# Patient Record
Sex: Female | Born: 1935 | Race: White | Hispanic: No | Smoking: Never smoker
Health system: Southern US, Community
[De-identification: ages and names within clinical notes are randomized; demographics above are authoritative.]

## PROBLEM LIST (undated history)

## (undated) DIAGNOSIS — B159 Hepatitis A without hepatic coma: Secondary | ICD-10-CM

## (undated) DIAGNOSIS — I10 Essential (primary) hypertension: Secondary | ICD-10-CM

## (undated) DIAGNOSIS — J189 Pneumonia, unspecified organism: Secondary | ICD-10-CM

## (undated) DIAGNOSIS — C50912 Malignant neoplasm of unspecified site of left female breast: Secondary | ICD-10-CM

## (undated) DIAGNOSIS — F419 Anxiety disorder, unspecified: Secondary | ICD-10-CM

## (undated) DIAGNOSIS — Z9889 Other specified postprocedural states: Secondary | ICD-10-CM

## (undated) DIAGNOSIS — M1990 Unspecified osteoarthritis, unspecified site: Secondary | ICD-10-CM

## (undated) DIAGNOSIS — Z9289 Personal history of other medical treatment: Secondary | ICD-10-CM

## (undated) DIAGNOSIS — H53121 Transient visual loss, right eye: Secondary | ICD-10-CM

## (undated) DIAGNOSIS — F32A Depression, unspecified: Secondary | ICD-10-CM

## (undated) DIAGNOSIS — IMO0001 Reserved for inherently not codable concepts without codable children: Secondary | ICD-10-CM

## (undated) DIAGNOSIS — I4891 Unspecified atrial fibrillation: Secondary | ICD-10-CM

## (undated) DIAGNOSIS — R112 Nausea with vomiting, unspecified: Secondary | ICD-10-CM

## (undated) DIAGNOSIS — Z923 Personal history of irradiation: Secondary | ICD-10-CM

## (undated) DIAGNOSIS — F329 Major depressive disorder, single episode, unspecified: Secondary | ICD-10-CM

## (undated) DIAGNOSIS — H5319 Other subjective visual disturbances: Secondary | ICD-10-CM

## (undated) DIAGNOSIS — D649 Anemia, unspecified: Secondary | ICD-10-CM

## (undated) DIAGNOSIS — E042 Nontoxic multinodular goiter: Secondary | ICD-10-CM

## (undated) DIAGNOSIS — C50919 Malignant neoplasm of unspecified site of unspecified female breast: Secondary | ICD-10-CM

## (undated) HISTORY — PX: CATARACT EXTRACTION W/ INTRAOCULAR LENS IMPLANT: SHX1309

## (undated) HISTORY — PX: TONSILLECTOMY AND ADENOIDECTOMY: SUR1326

## (undated) HISTORY — DX: Nontoxic multinodular goiter: E04.2

## (undated) HISTORY — PX: FRACTURE SURGERY: SHX138

## (undated) HISTORY — DX: Unspecified atrial fibrillation: I48.91

## (undated) HISTORY — DX: Essential (primary) hypertension: I10

## (undated) HISTORY — DX: Malignant neoplasm of unspecified site of unspecified female breast: C50.919

---
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
## 2008-11-19 HISTORY — PX: WRIST FRACTURE SURGERY: SHX121

## 2008-11-23 ENCOUNTER — Emergency Department (HOSPITAL_COMMUNITY): Admission: EM | Admit: 2008-11-23 | Discharge: 2008-11-24 | Payer: Self-pay | Admitting: Emergency Medicine

## 2008-12-24 ENCOUNTER — Ambulatory Visit: Payer: Self-pay | Admitting: Internal Medicine

## 2008-12-24 DIAGNOSIS — H902 Conductive hearing loss, unspecified: Secondary | ICD-10-CM | POA: Insufficient documentation

## 2008-12-28 ENCOUNTER — Telehealth (INDEPENDENT_AMBULATORY_CARE_PROVIDER_SITE_OTHER): Payer: Self-pay | Admitting: *Deleted

## 2009-03-18 DIAGNOSIS — K219 Gastro-esophageal reflux disease without esophagitis: Secondary | ICD-10-CM

## 2011-02-06 ENCOUNTER — Other Ambulatory Visit: Payer: Self-pay | Admitting: Geriatric Medicine

## 2011-02-06 DIAGNOSIS — Z1231 Encounter for screening mammogram for malignant neoplasm of breast: Secondary | ICD-10-CM

## 2011-02-23 ENCOUNTER — Ambulatory Visit
Admission: RE | Admit: 2011-02-23 | Discharge: 2011-02-23 | Disposition: A | Discharge status: Home / Self Care | Payer: Medicare Other | Source: Ambulatory Visit | Attending: Geriatric Medicine | Admitting: Geriatric Medicine

## 2011-02-23 DIAGNOSIS — Z1231 Encounter for screening mammogram for malignant neoplasm of breast: Secondary | ICD-10-CM

## 2011-04-10 ENCOUNTER — Other Ambulatory Visit: Payer: Self-pay | Admitting: Geriatric Medicine

## 2011-04-10 DIAGNOSIS — R928 Other abnormal and inconclusive findings on diagnostic imaging of breast: Secondary | ICD-10-CM

## 2011-04-26 ENCOUNTER — Ambulatory Visit: Payer: Medicare Other

## 2011-04-26 ENCOUNTER — Ambulatory Visit
Admission: RE | Admit: 2011-04-26 | Discharge: 2011-04-26 | Disposition: A | Discharge status: Home / Self Care | Payer: Medicare Other | Source: Ambulatory Visit | Attending: Geriatric Medicine | Admitting: Geriatric Medicine

## 2011-04-26 ENCOUNTER — Other Ambulatory Visit: Payer: Self-pay | Admitting: Geriatric Medicine

## 2011-04-26 DIAGNOSIS — R928 Other abnormal and inconclusive findings on diagnostic imaging of breast: Secondary | ICD-10-CM

## 2011-09-19 ENCOUNTER — Other Ambulatory Visit: Payer: Self-pay | Admitting: Geriatric Medicine

## 2011-09-19 DIAGNOSIS — N6009 Solitary cyst of unspecified breast: Secondary | ICD-10-CM

## 2011-11-29 ENCOUNTER — Ambulatory Visit
Admission: RE | Admit: 2011-11-29 | Discharge: 2011-11-29 | Disposition: A | Discharge status: Home / Self Care | Payer: Medicare Other | Source: Ambulatory Visit | Attending: Geriatric Medicine | Admitting: Geriatric Medicine

## 2011-11-29 DIAGNOSIS — N6009 Solitary cyst of unspecified breast: Secondary | ICD-10-CM

## 2012-01-25 ENCOUNTER — Other Ambulatory Visit: Payer: Self-pay | Admitting: Geriatric Medicine

## 2012-01-25 DIAGNOSIS — Z1231 Encounter for screening mammogram for malignant neoplasm of breast: Secondary | ICD-10-CM

## 2012-02-16 ENCOUNTER — Other Ambulatory Visit: Payer: Self-pay | Admitting: Geriatric Medicine

## 2012-02-16 DIAGNOSIS — H3400 Transient retinal artery occlusion, unspecified eye: Secondary | ICD-10-CM

## 2012-02-21 ENCOUNTER — Ambulatory Visit
Admission: RE | Admit: 2012-02-21 | Discharge: 2012-02-21 | Disposition: A | Discharge status: Home / Self Care | Payer: 59 | Source: Ambulatory Visit | Attending: Geriatric Medicine | Admitting: Geriatric Medicine

## 2012-02-21 DIAGNOSIS — H3400 Transient retinal artery occlusion, unspecified eye: Secondary | ICD-10-CM

## 2012-02-22 ENCOUNTER — Other Ambulatory Visit: Payer: Self-pay | Admitting: Geriatric Medicine

## 2012-02-22 DIAGNOSIS — E041 Nontoxic single thyroid nodule: Secondary | ICD-10-CM

## 2012-02-26 ENCOUNTER — Ambulatory Visit
Admission: RE | Admit: 2012-02-26 | Discharge: 2012-02-26 | Disposition: A | Discharge status: Home / Self Care | Payer: 59 | Source: Ambulatory Visit | Attending: Geriatric Medicine | Admitting: Geriatric Medicine

## 2012-02-26 DIAGNOSIS — E041 Nontoxic single thyroid nodule: Secondary | ICD-10-CM

## 2012-04-05 ENCOUNTER — Ambulatory Visit: Payer: Medicare Other

## 2012-06-06 ENCOUNTER — Ambulatory Visit: Payer: Self-pay

## 2012-07-11 ENCOUNTER — Ambulatory Visit: Payer: Self-pay

## 2013-01-14 ENCOUNTER — Encounter: Payer: Self-pay | Admitting: Cardiovascular Disease

## 2013-01-14 ENCOUNTER — Ambulatory Visit (INDEPENDENT_AMBULATORY_CARE_PROVIDER_SITE_OTHER): Payer: Medicare PPO | Admitting: Cardiovascular Disease

## 2013-01-14 VITALS — BP 139/78 | HR 61 | Ht 67.0 in | Wt 174.0 lb

## 2013-01-14 DIAGNOSIS — R000 Tachycardia, unspecified: Secondary | ICD-10-CM

## 2013-01-14 DIAGNOSIS — R0602 Shortness of breath: Secondary | ICD-10-CM

## 2013-01-14 DIAGNOSIS — R079 Chest pain, unspecified: Secondary | ICD-10-CM

## 2013-01-14 DIAGNOSIS — I4891 Unspecified atrial fibrillation: Secondary | ICD-10-CM

## 2013-01-14 DIAGNOSIS — I480 Paroxysmal atrial fibrillation: Secondary | ICD-10-CM

## 2013-01-14 MED ORDER — DILTIAZEM HCL ER COATED BEADS 120 MG PO CP24: 120.0000 mg | ORAL_CAPSULE | Freq: Every day | ORAL | Status: DC

## 2013-01-14 MED ORDER — RIVAROXABAN 20 MG PO TABS: 20.0000 mg | ORAL_TABLET | Freq: Every day | ORAL | Status: DC

## 2013-01-14 NOTE — Assessment & Plan Note (Signed)
Represents for further evaluation of paroxysmal atrial fibrillation. She clearly is in sinus rhythm today.  She has a history of borderline hypertension and has had hypertension in the past. She's 79 years old. She has a chadsVASC2 score of 3. In addition, she has been having some unsteadiness and some slight confusion. It is possible that she's had a small TIA or small strokes in the past.  I would favor starting her on full dose anticoagulation. We'll start her on Xarelto 20 mg a day.   We will discontinue his aspirin.  As a matter of convenience, we will change the regular Cardizem to long-acting Cardizem 120 mg a day.  We'll get an echocardiogram for further evaluation of her left exercise and function.  We'll see her back in the office in 2-3 months for followup visit.

## 2013-01-14 NOTE — Patient Instructions (Signed)
Stop taking Aspirin 325mg  daily.  Start taking Xarelto 20mg  once daily with largest meal of the day.  Stop taking Diltiazem 30mg  and start taking Diltiazem 120mg  once daily.  Schedule Echocardiogram.   Follow-up in 3 months.

## 2013-01-14 NOTE — Progress Notes (Signed)
     Terri Ponce Date of Birth  10-17-1928       [facility]    [place] [address]  [address] [place], [place]  [address]   [place], [place]  [address] [phone]     [phone]   Fax  [phone]    Fax [phone]  Problem List: 1. Paroxysmal atrial fibrillation 2. Thyroid nodules 3. Hx of Hypertension  History of Present Illness:  Terri Ponce is an 79 year old female who is seen today for further evaluation of paroxysmal atrial fibrillation.  She has had smptoms for years - palps off and on for years.   The palpitations are sporatic.  She thinks they may be related to stress.    She has a sensation in her left ribs.  She is able to push through the palpitations and denies any CP or dyspnea associated with the arrhythmia.  These palpitatioins   Recently she had some dyspnea at the beach and last week when she went to see Dr. Pete Glatter.    She seems to have some slight dizziness and unsteadiness recently.    She has had HTN in the past - resolved with a better diet and exercise and weight loss program.  BP now is ok.    She goes to the silver sneakers program  And measures her BP several times a week.  She has been having night sweats for the past several years.       No current outpatient prescriptions on file prior to visit.   No current facility-administered medications on file prior to visit.    Allergies  Allergen Reactions  . Amoxicillin     REACTION: abdominal pain  . Sulfonamide Derivatives     Past Medical History  Diagnosis Date  . A-fib   . Multiple thyroid nodules     Past Surgical History  Procedure Laterality Date  . Ovarian cyst surgery    . Intraocular lens insertion      right    History  Smoking status  . Never Smoker   Smokeless tobacco  . Not on file    History  Alcohol Use No    Family History  Problem Relation Age of Onset  . Arrhythmia Mother     A-Fib  . Hyperlipidemia  Mother   . Hypertension Mother     Reviw of Systems:  Reviewed in the HPI.  All other systems are negative.  Physical Exam: Blood pressure 139/78, pulse 61, height 5\' 7"  (1.702 m), weight 174 lb (78.926 kg). General: Well developed, well nourished, in no acute distress.  Head: Normocephalic, atraumatic, sclera non-icteric, mucus membranes are moist,   Neck: Supple. Carotids are 2 + without bruits. No JVD   Lungs: Clear   Heart: RR, normal S1, S2, no murmurs  Abdomen: Soft, non-tender, non-distended with normal bowel sounds.  Msk:  Strength and tone are normal   Extremities: No clubbing or cyanosis. No edema.  Distal pedal pulses are 2+ and equal    Neuro: CN II - XII intact.  Alert and oriented X 3.   Psych:  Normal   ECG: January 14, 2013:  NSR, occasional PVCs.    Assessment / Plan:

## 2013-01-16 ENCOUNTER — Ambulatory Visit (HOSPITAL_COMMUNITY): Payer: Medicare PPO | Attending: Cardiovascular Disease | Admitting: Radiology

## 2013-01-16 DIAGNOSIS — R000 Tachycardia, unspecified: Secondary | ICD-10-CM

## 2013-01-16 DIAGNOSIS — R079 Chest pain, unspecified: Secondary | ICD-10-CM

## 2013-01-16 DIAGNOSIS — I080 Rheumatic disorders of both mitral and aortic valves: Secondary | ICD-10-CM | POA: Insufficient documentation

## 2013-01-16 DIAGNOSIS — I4891 Unspecified atrial fibrillation: Secondary | ICD-10-CM

## 2013-01-16 DIAGNOSIS — R9431 Abnormal electrocardiogram [ECG] [EKG]: Secondary | ICD-10-CM

## 2013-01-16 DIAGNOSIS — I079 Rheumatic tricuspid valve disease, unspecified: Secondary | ICD-10-CM | POA: Insufficient documentation

## 2013-01-16 DIAGNOSIS — I10 Essential (primary) hypertension: Secondary | ICD-10-CM | POA: Insufficient documentation

## 2013-01-16 DIAGNOSIS — R0602 Shortness of breath: Secondary | ICD-10-CM

## 2013-01-16 NOTE — Progress Notes (Signed)
Echocardiogram performed.  

## 2013-01-24 ENCOUNTER — Institutional Professional Consult (permissible substitution): Payer: 59 | Admitting: Cardiovascular Disease

## 2013-01-24 ENCOUNTER — Telehealth: Payer: Self-pay | Admitting: Cardiovascular Disease

## 2013-01-24 ENCOUNTER — Other Ambulatory Visit: Payer: 59

## 2013-01-24 NOTE — Telephone Encounter (Signed)
Per Dr Nahser/ stop Cardizem, continue Xarelto, keep f/u appointment. Pt agreed to care plan.

## 2013-01-24 NOTE — Telephone Encounter (Signed)
Pt taking diltiazem and it causes her a lot of dizziness, [facility] can change med

## 2013-02-03 ENCOUNTER — Telehealth: Payer: Self-pay | Admitting: *Deleted

## 2013-02-03 MED ORDER — DILTIAZEM HCL 30 MG PO TABS: 30.0000 mg | ORAL_TABLET | Freq: Two times a day (BID) | ORAL | Status: DC

## 2013-02-03 NOTE — Telephone Encounter (Signed)
Patient called in regarding her medication diltiazem, it was d/c 8/26 due to patient experiencing dizziness, she had at one time been on a lower dose by Dr Pete Glatter, patient states she feels better when she is taking this medication (other than the dizziness) she is asking if she can have a lower dose of the diltiazem than the 120 mg. Please let me know and I wall call her and order medication. Thanks, Addison Lank, RN Patient Care Advocate

## 2013-02-03 NOTE — Telephone Encounter (Signed)
Pt informed and agreed to plan

## 2013-02-03 NOTE — Telephone Encounter (Signed)
She can try regular Diltizem 30 mg tabs - perhaps BID.

## 2013-02-03 NOTE — Telephone Encounter (Signed)
Will address with Dr Elease Hashimoto.

## 2013-02-17 ENCOUNTER — Other Ambulatory Visit: Payer: Self-pay | Admitting: Geriatric Medicine

## 2013-02-17 DIAGNOSIS — E041 Nontoxic single thyroid nodule: Secondary | ICD-10-CM

## 2013-02-18 ENCOUNTER — Ambulatory Visit
Admission: RE | Admit: 2013-02-18 | Discharge: 2013-02-18 | Disposition: A | Discharge status: Home / Self Care | Payer: Medicare PPO | Source: Ambulatory Visit | Attending: Geriatric Medicine | Admitting: Geriatric Medicine

## 2013-02-18 DIAGNOSIS — E041 Nontoxic single thyroid nodule: Secondary | ICD-10-CM

## 2013-04-23 ENCOUNTER — Ambulatory Visit (INDEPENDENT_AMBULATORY_CARE_PROVIDER_SITE_OTHER): Payer: Medicare PPO | Admitting: Cardiovascular Disease

## 2013-04-23 ENCOUNTER — Encounter: Payer: Self-pay | Admitting: Cardiovascular Disease

## 2013-04-23 VITALS — BP 122/72 | HR 65 | Ht 66.0 in | Wt 174.5 lb

## 2013-04-23 DIAGNOSIS — I4891 Unspecified atrial fibrillation: Secondary | ICD-10-CM

## 2013-04-23 DIAGNOSIS — I480 Paroxysmal atrial fibrillation: Secondary | ICD-10-CM

## 2013-04-23 NOTE — Patient Instructions (Addendum)
Ask your pharmacist about the cost of Eliquis 5 mg twice a day.   Your physician recommends that you schedule a follow-up appointment in: 6 months  Your physician recommends that you return for lab work in: 6 months Bmp Cbc   EKG

## 2013-04-23 NOTE — Progress Notes (Signed)
Terri Ponce Date of Birth  22-Nov-1928        [facility]    [place] [address]  [address] [place], [place]  [address]   [place], [place]  [address] [phone]     [phone]   Fax  [phone]    Fax [phone]  Problem List: 1. Paroxysmal atrial fibrillation 2. Thyroid nodules 3. Hx of Hypertension  History of Present Illness:  Terri Ponce is an 79 year old female (mother of [name])  who is seen today for further evaluation of paroxysmal atrial fibrillation.  She has had smptoms for years - palps off and on for years.   The palpitations are sporatic.  She thinks they may be related to stress.    She has a sensation in her left ribs.  She is able to push through the palpitations and denies any CP or dyspnea associated with the arrhythmia.  These palpitatioins   Recently she had some dyspnea at the beach and last week when she went to see Dr. Pete Glatter.    She seems to have some slight dizziness and unsteadiness recently.    She has had HTN in the past - resolved with a better diet and exercise and weight loss program.  BP now is ok.   She goes to the silver sneakers program  And measures her BP several times a week. She has been having night sweats for the past several years.      Dec. 3, 2014:  Terri Ponce is seen today with her daughter [name]).  She has tapered her Diltiazem 30 mg a day because of dizziness and nausea.   We also tried slow release Cardizem but she had dizziness and nausea with that as well.      Current Outpatient Prescriptions on File Prior to Visit  Medication Sig Dispense Refill  . Rivaroxaban (XARELTO) 20 MG TABS tablet Take 1 tablet (20 mg total) by mouth daily.  30 tablet  12   No current facility-administered medications on file prior to visit.    Allergies  Allergen Reactions  . Amoxicillin     REACTION: abdominal pain  . Sulfonamide Derivatives     Past Medical History    Diagnosis Date  . A-fib   . Multiple thyroid nodules     Past Surgical History  Procedure Laterality Date  . Ovarian cyst surgery    . Intraocular lens insertion      right    History  Smoking status  . Never Smoker   Smokeless tobacco  . Not on file    History  Alcohol Use No    Family History  Problem Relation Age of Onset  . Arrhythmia Mother     A-Fib  . Hyperlipidemia Mother   . Hypertension Mother     Reviw of Systems:  Reviewed in the HPI.  All other systems are negative.  Physical Exam: Blood pressure 122/72, pulse 65, height 5\' 6"  (1.676 m), weight 174 lb 8 oz (79.153 kg). General: Well developed, well nourished, in no acute distress.  Head: Normocephalic, atraumatic, sclera non-icteric, mucus membranes are moist,   Neck: Supple. Carotids are 2 + without bruits. No JVD   Lungs: Clear   Heart: RR, normal S1, S2, no murmurs  Abdomen: Soft, non-tender, non-distended with normal bowel sounds.  Msk:  Strength and tone are normal   Extremities: No clubbing or cyanosis. No edema.  Distal pedal pulses are  2+ and equal    Neuro: CN II - XII intact.  Alert and oriented X 3.   Psych:  Normal   ECG: Dec. 3, 2014:  NSR at 65. No St / T wave abn.   Assessment / Plan:

## 2013-04-23 NOTE — Assessment & Plan Note (Signed)
Terri Ponce is doing well. She's not had any further episodes of atrial fibrillation. She has decreased her diltiazem because of side effects including dizziness and nausea.  She is currently tolerating the Xarelto.  I've given her the name of an alternative NOAC ( Elquis 5 mg BID).  She'll check with her pharmacy to see if that is on her formulary. Otherwise we'll continue with her current medications.  I will see her  her again in 6 month period we'll check EKG, basic metabolic profile, and CBC at that time.

## 2013-06-09 ENCOUNTER — Other Ambulatory Visit: Payer: Self-pay | Admitting: Geriatric Medicine

## 2013-06-09 DIAGNOSIS — N644 Mastodynia: Secondary | ICD-10-CM

## 2013-06-09 DIAGNOSIS — N63 Unspecified lump in unspecified breast: Secondary | ICD-10-CM

## 2013-06-17 ENCOUNTER — Other Ambulatory Visit: Payer: Self-pay | Admitting: Geriatric Medicine

## 2013-06-17 DIAGNOSIS — N644 Mastodynia: Secondary | ICD-10-CM

## 2013-06-17 DIAGNOSIS — N63 Unspecified lump in unspecified breast: Secondary | ICD-10-CM

## 2013-06-17 DIAGNOSIS — N6489 Other specified disorders of breast: Secondary | ICD-10-CM

## 2013-06-20 ENCOUNTER — Ambulatory Visit
Admission: RE | Admit: 2013-06-20 | Discharge: 2013-06-20 | Disposition: A | Discharge status: Home / Self Care | Payer: Medicare PPO | Source: Ambulatory Visit | Attending: Geriatric Medicine | Admitting: Geriatric Medicine

## 2013-06-20 ENCOUNTER — Other Ambulatory Visit: Payer: Self-pay | Admitting: Geriatric Medicine

## 2013-06-20 DIAGNOSIS — N63 Unspecified lump in unspecified breast: Secondary | ICD-10-CM

## 2013-06-20 DIAGNOSIS — N6489 Other specified disorders of breast: Secondary | ICD-10-CM

## 2013-06-20 DIAGNOSIS — N644 Mastodynia: Secondary | ICD-10-CM

## 2013-06-30 ENCOUNTER — Telehealth: Payer: Self-pay | Admitting: *Deleted

## 2013-06-30 NOTE — Telephone Encounter (Signed)
See note below. Please advise.  

## 2013-06-30 NOTE — Telephone Encounter (Signed)
lvm 2/9 

## 2013-06-30 NOTE — Telephone Encounter (Signed)
Patient on Louanna Raw but wants to change to Eloquis 5 mg twice a day.

## 2013-07-04 ENCOUNTER — Other Ambulatory Visit: Payer: Self-pay | Admitting: Geriatric Medicine

## 2013-07-04 ENCOUNTER — Ambulatory Visit
Admission: RE | Admit: 2013-07-04 | Discharge: 2013-07-04 | Disposition: A | Discharge status: Home / Self Care | Payer: Medicare PPO | Source: Ambulatory Visit | Attending: Geriatric Medicine | Admitting: Geriatric Medicine

## 2013-07-04 DIAGNOSIS — N644 Mastodynia: Secondary | ICD-10-CM

## 2013-07-04 DIAGNOSIS — N63 Unspecified lump in unspecified breast: Secondary | ICD-10-CM

## 2013-07-04 NOTE — Telephone Encounter (Signed)
Patient has decided she wants to switch to Eliquis after having a conversation with Dr. Acie Fredrickson. She wondered if it is still all right to switch?

## 2013-07-07 ENCOUNTER — Telehealth: Payer: Self-pay

## 2013-07-07 NOTE — Telephone Encounter (Signed)
Pt states she is having a needle biopsy on the 10 am 07/09/2013 . Pt asks she is supposed to d/c her Xarelto before. Please advise.

## 2013-07-07 NOTE — Telephone Encounter (Signed)
Hold Xarelto on Feb. 17 in preparation for breast bx on Feb. 18. i left message on phone and daughters phone

## 2013-07-09 ENCOUNTER — Inpatient Hospital Stay: Admission: RE | Admit: 2013-07-09 | Payer: Medicare PPO | Source: Ambulatory Visit

## 2013-07-09 NOTE — Telephone Encounter (Signed)
Patient conformed receiving Dr. Lanny Hurst voicemail. Patient verbalized understanding.

## 2013-07-14 MED ORDER — APIXABAN 5 MG PO TABS: 5.0000 mg | ORAL_TABLET | Freq: Two times a day (BID) | ORAL | Status: DC

## 2013-07-14 NOTE — Telephone Encounter (Signed)
She may DC xarelto and start eliquis 5 mg BID

## 2013-07-14 NOTE — Telephone Encounter (Signed)
Informed patient that per Dr. Acie Fredrickson she can "She may DC xarelto and start eliquis 5 mg BID". Patient verbalized understanding.

## 2013-07-14 NOTE — Telephone Encounter (Signed)
lvm 2/23

## 2013-07-14 NOTE — Telephone Encounter (Signed)
Did you take care of this; see telephone note in chart regarding biopsy.

## 2013-07-16 ENCOUNTER — Ambulatory Visit
Admission: RE | Admit: 2013-07-16 | Discharge: 2013-07-16 | Disposition: A | Discharge status: Home / Self Care | Payer: Medicare PPO | Source: Ambulatory Visit | Attending: Geriatric Medicine | Admitting: Geriatric Medicine

## 2013-07-16 ENCOUNTER — Other Ambulatory Visit: Payer: Self-pay | Admitting: Geriatric Medicine

## 2013-07-16 DIAGNOSIS — N644 Mastodynia: Secondary | ICD-10-CM

## 2013-07-16 DIAGNOSIS — N63 Unspecified lump in unspecified breast: Secondary | ICD-10-CM

## 2013-07-16 HISTORY — PX: BREAST BIOPSY: SHX20

## 2013-07-18 ENCOUNTER — Telehealth: Payer: Self-pay | Admitting: *Deleted

## 2013-07-18 ENCOUNTER — Other Ambulatory Visit: Payer: Self-pay | Admitting: Geriatric Medicine

## 2013-07-18 DIAGNOSIS — C50919 Malignant neoplasm of unspecified site of unspecified female breast: Secondary | ICD-10-CM

## 2013-07-18 NOTE — Telephone Encounter (Signed)
Called and attempted to schedule patient for [facility] 07/23/13.  Patient is requesting her surgeon to be Dr. Harlow Asa. Informed patient that he is not one of the surgeons that come to our [facility] and that she would be made a separate appointment for him.  She is ok with this.  Informed that someone from the breast center will call her with an appointment.  Contact information given and encouraged her to call with any needs.

## 2013-07-25 ENCOUNTER — Ambulatory Visit
Admission: RE | Admit: 2013-07-25 | Discharge: 2013-07-25 | Disposition: A | Discharge status: Home / Self Care | Payer: Medicare PPO | Source: Ambulatory Visit | Attending: Geriatric Medicine | Admitting: Geriatric Medicine

## 2013-07-25 DIAGNOSIS — C50919 Malignant neoplasm of unspecified site of unspecified female breast: Secondary | ICD-10-CM

## 2013-07-25 MED ORDER — GADOBENATE DIMEGLUMINE 529 MG/ML IV SOLN
16.0000 mL | Freq: Once | INTRAVENOUS | Status: AC | PRN
  Administered 2013-07-25: 16 mL via INTRAVENOUS

## 2013-07-29 ENCOUNTER — Other Ambulatory Visit: Payer: Self-pay | Admitting: Geriatric Medicine

## 2013-07-29 DIAGNOSIS — R928 Other abnormal and inconclusive findings on diagnostic imaging of breast: Secondary | ICD-10-CM

## 2013-07-30 ENCOUNTER — Ambulatory Visit (INDEPENDENT_AMBULATORY_CARE_PROVIDER_SITE_OTHER): Payer: Medicare PPO | Admitting: General Surgery

## 2013-07-30 ENCOUNTER — Telehealth (INDEPENDENT_AMBULATORY_CARE_PROVIDER_SITE_OTHER): Payer: Self-pay

## 2013-07-30 ENCOUNTER — Encounter (INDEPENDENT_AMBULATORY_CARE_PROVIDER_SITE_OTHER): Payer: Self-pay | Admitting: General Surgery

## 2013-07-30 ENCOUNTER — Ambulatory Visit (INDEPENDENT_AMBULATORY_CARE_PROVIDER_SITE_OTHER): Payer: Medicare PPO | Admitting: Surgery

## 2013-07-30 ENCOUNTER — Encounter (INDEPENDENT_AMBULATORY_CARE_PROVIDER_SITE_OTHER): Payer: Medicare PPO | Admitting: General Surgery

## 2013-07-30 VITALS — BP 138/80 | HR 70 | Temp 98.8°F | Resp 12 | Ht 66.0 in | Wt 168.8 lb

## 2013-07-30 DIAGNOSIS — C50319 Malignant neoplasm of lower-inner quadrant of unspecified female breast: Secondary | ICD-10-CM

## 2013-07-30 DIAGNOSIS — C50212 Malignant neoplasm of upper-inner quadrant of left female breast: Secondary | ICD-10-CM | POA: Insufficient documentation

## 2013-07-30 DIAGNOSIS — Z170 Estrogen receptor positive status [ER+]: Secondary | ICD-10-CM

## 2013-07-30 NOTE — Telephone Encounter (Signed)
Terri Ponce with [facility] to notify her that she could go ahead and schedule the pt now for the 2 areas of concern seen on the breast MRI. I did advise Juliann Pulse that Dr Donne Hazel request a clip to be placed if they lymph node does get bx by radiology. Juliann Pulse will add that to the comments. Juliann Pulse will work on getting pt scheduled and call me back with the appt's.

## 2013-07-31 ENCOUNTER — Other Ambulatory Visit: Payer: Self-pay | Admitting: Oncology

## 2013-08-01 ENCOUNTER — Other Ambulatory Visit: Payer: Medicare PPO

## 2013-08-01 ENCOUNTER — Telehealth: Payer: Self-pay | Admitting: *Deleted

## 2013-08-01 ENCOUNTER — Other Ambulatory Visit: Payer: Self-pay | Admitting: Oncology

## 2013-08-01 NOTE — Telephone Encounter (Signed)
Received appt date and time from Dr. Jana Hakim and I called and confirmed 08/13/13 appt w/ pt.  Unable to schedule Dr. Virgie Dad appt - emailed Audie Clear to enter it for me.  Mailed before appt letter, welcome packet & intake form to pt.  Emailed Alisha at [facility] to make her aware.

## 2013-08-04 ENCOUNTER — Ambulatory Visit
Admission: RE | Admit: 2013-08-04 | Discharge: 2013-08-04 | Disposition: A | Discharge status: Home / Self Care | Payer: Medicare PPO | Source: Ambulatory Visit | Attending: Geriatric Medicine | Admitting: Geriatric Medicine

## 2013-08-04 DIAGNOSIS — R928 Other abnormal and inconclusive findings on diagnostic imaging of breast: Secondary | ICD-10-CM

## 2013-08-05 NOTE — Progress Notes (Signed)
Patient ID: Terri Ponce, female   DOB: 12-19-1928, 80 y.o.   MRN: [id number]  Chief Complaint  Patient presents with  . New Evaluation    Eval lft breas cancer    HPI Terri Ponce is a 80 y.o. female.  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  She noted some left breast soreness and a mass in January near her inframammary fold.  This was in the lower inner portion of the left breast.  This had some overlying skin changes.  She was treated with abx at that time and it got better in terms of the soreness. The mass has remained though.  She has undergone evaluation what eventually was an MRI that shows a left breast far lower inner quadrant mass that measures 3.2x1.6x2.2 cm irregular mass.  There are some spicules of enhancement that extend posteriorly to the level of the pectoralis.  This appears to involve the overlying skin also.  1.5 cm medial and anterior to the biopsy proven mass is a .9 x.2 cm area of enhancement.  There are some small left internal mammary nodes.  There is also a 1 cm node in left axilla that is mildly morphological abnormal node.  There are no other abnormalities.  Biopsy was done and shows an invasive lobular carcinoma that is er/pr positive with low Ki67 and her2 neu not amplified.  She comes in today with her daughter to discuss her options.  Past Medical History  Diagnosis Date  . A-fib   . Multiple thyroid nodules   . Blood transfusion without reported diagnosis   . Hypertension     Past Surgical History  Procedure Laterality Date  . Ovarian cyst surgery    . Intraocular lens insertion      right    Family History  Problem Relation Age of Onset  . Arrhythmia Mother     A-Fib  . Hyperlipidemia Mother   . Hypertension Mother   . Cancer Mother     breast cancer  . Cancer Sister     Social History History  Substance Use Topics  . Smoking status: Never Smoker   . Smokeless  tobacco: Not on file  . Alcohol Use: No    Allergies  Allergen Reactions  . Amoxicillin     REACTION: abdominal pain  . Sulfonamide Derivatives     Current Outpatient Prescriptions  Medication Sig Dispense Refill  . apixaban (ELIQUIS) 5 MG TABS tablet Take 1 tablet (5 mg total) by mouth 2 (two) times daily.  60 tablet  6  . calcium & magnesium carbonates (MYLANTA) 311-232 MG per tablet Take 1 tablet by mouth daily.      Marland Kitchen CRANBERRY PO Take by mouth.      . diltiazem (CARDIZEM) 30 MG tablet Take 30 mg by mouth daily.      . Multiple Vitamin (MULTIVITAMIN) capsule Take 1 capsule by mouth daily.      Marland Kitchen OVER THE COUNTER MEDICATION        No current facility-administered medications for this visit.    Review of Systems Review of Systems  Constitutional: Negative for fever, chills and unexpected weight change.  HENT: Positive for hearing loss and voice change. Negative for congestion, sore throat and trouble swallowing.   Eyes: Negative for visual disturbance.  Respiratory: Negative for cough and wheezing.   Cardiovascular: Positive for palpitations. Negative for chest pain and leg swelling.  Gastrointestinal: Negative for nausea, vomiting, abdominal pain, diarrhea, constipation, blood in stool, abdominal distention and anal bleeding.  Genitourinary: Negative for hematuria, vaginal bleeding and difficulty urinating.  Musculoskeletal: Negative for arthralgias.  Skin: Negative for rash and wound.  Neurological: Negative for seizures, syncope and headaches.  Hematological: Negative for adenopathy. Does not bruise/bleed easily.  Psychiatric/Behavioral: Negative for confusion.    Blood pressure 138/80, pulse 70, temperature 98.8 F (37.1 C), resp. rate 12, height 5\' 6"  (1.676 m), weight 168 lb 12.8 oz (76.567 kg).  Physical Exam Physical Exam  Constitutional: She is oriented to person, place, and time. She appears well-developed and well-nourished.  Neck: Neck supple.    Cardiovascular: Normal rate, regular rhythm and normal heart sounds.   Pulmonary/Chest: Effort normal and breath sounds normal. She has no wheezes. She has no rales. Right breast exhibits no inverted nipple, no mass, no nipple discharge, no skin change and no tenderness. Left breast exhibits mass and skin change. Left breast exhibits no inverted nipple, no nipple discharge and no tenderness.    Abdominal: Soft.  Lymphadenopathy:    She has no cervical adenopathy.  Neurological: She is alert and oriented to person, place, and time.    Data Reviewed EXAM:  BILATERAL BREAST MRI WITH AND WITHOUT CONTRAST  LABS: BUN and creatinine were obtained on site at [facility] at  [address]  Results: BUN 17 mg/dL, Creatinine 0.7 mg/dL.  TECHNIQUE:  Multiplanar, multisequence MR images of both breasts were obtained  prior to and following the intravenous administration of 17ml of  MultiHance.  THREE-DIMENSIONAL MR IMAGE RENDERING ON INDEPENDENT WORKSTATION:  Three-dimensional MR images were rendered by post-processing of the  original MR data on an independent workstation. The  three-dimensional MR images were interpreted, and findings are  reported in the following complete MRI report for this study. Three  dimensional images were evaluated at the independent DynaCad  workstation  COMPARISON: Previous exams  FINDINGS:  Breast composition: a. Almost entirely fat.  Background parenchymal enhancement: Mild  Right breast: No mass or abnormal enhancement.  Left breast: Within the far lower inner quadrant of the left breast  in the inframammary fold there is a 3.2 x 1.6 x 2.2 cm irregular  enhancing mass containing a biopsy marking clip compatible with  recently biopsy proven right breast carcinoma. There a few spicules  of enhancement which extend posteriorly to the level of the  pectoralis muscle. Additionally, particularly demonstrated on the  sagittal view, the enhancing  mass immediately abuts and appears to  involve a small section of overlying skin.  1.5 cm medial and anterior to the biopsy proven mass is a 0.9 x 0.2  cm area of nodular enhancement.  Lymph nodes: There are two left internal mammary lymph nodes  visualized, measuring 4 mm and 2 mm respectively. Note is made of a  mildly morphologically abnormal appearing 1 cm lymph node within the  left axilla.  Ancillary findings: Multiple large T2 bright nonenhancing masses  demonstrated within the visualized aspect of the liver, measuring up  to 6 cm. These are incompletely evaluated however favored to  represent hepatic cysts.  IMPRESSION:  Enhancing spiculated mass within the lower inner quadrant of the  left breast compatible with recently biopsy proven invasive  carcinoma. The posterior margin appears to extend to the pectoralis  muscle and the inferior margin appears immediately adjacent to and  likely involves the overlying skin.  Nonspecific 0.9 cm area of nodular enhancement medial and  anterior  to the biopsy proven mass. Consider further evaluation with MRI  guided core needle biopsy as indicated given surgical planning.  Two nonspecific left internal mammary lymph nodes, the largest of  which measures 4 mm. Disease to this location is not excluded.  Incompletely evaluated T2 bright nonenhancing masses within the  liver are favored to represent cysts.  RECOMMENDATION:  Treatment plan for known left breast malignancy.  Mildly morphologically abnormal 1 cm left axillary lymph node.  Recommend second-look ultrasound for definitive characterization. If  an abnormal lymph node is identified, consider core needle biopsy.  Nonspecific 0.9 cm area of nodular enhancement medial and anterior  to the biopsy proven mass. Consider further evaluation with MRI  guided core needle biopsy as indicated given surgical planning.  Consider further evaluation of internal mammary lymph nodes as  clinically  indicated.   Assessment    Clinical stage II left breast cancer    Plan    Biopsy other lesion in left breast, ultrasound with biopsy of left axillary node I think no matter what these results that consideration of neoadjuvant endocrine therapy would be the best plan.  This may give [place] a better chance to proceed with lumpectomy which would be her preference. This is sizable lesion in difficult location.  I will ask Dr Darnelle Catalan to see her per her request. We discussed the staging and pathophysiology of breast cancer. We discussed all of the different options for treatment for breast cancer including surgery, chemotherapy, radiation therapy, Herceptin, and antiestrogen therapy.   We discussed a sentinel lymph node biopsy as she does not appear to having lymph node involvement right now. We discussed the performance of that with injection of radioactive tracer and blue dye. We discussed that she would have an incision underneath her axillary hairline. We discussed up to a 5% risk lifetime of chronic shoulder pain as well as lymphedema associated with a sentinel lymph node biopsy.  We discussed the options for treatment of the breast cancer which included lumpectomy versus a mastectomy. We discussed the performance of the lumpectomy with seed placement. We discussed up to a 5-10% chance of a positive margin requiring reexcision in the operating room. We also discussed that she may need radiation therapy or antiestrogen therapy or both if she undergoes lumpectomy. We discussed the mastectomy and the postoperative care for that as well. We discussed that there is no difference in her survival whether she undergoes lumpectomy with radiation therapy or antiestrogen therapy versus a mastectomy.          Terri Ponce 08/05/2013, 11:26 AM

## 2013-08-05 NOTE — Telephone Encounter (Signed)
Pt's daughter called back. They were just questioning about the referral on to radiation oncology. I advised daughter that we normally refer to medical and radiation oncology for consults to get overall opinions from all the doctors about the pathology report. The daughter understands now.

## 2013-08-05 NOTE — Telephone Encounter (Signed)
LMOM returning pt's daughter call about appt's. I asked for her to call me back.

## 2013-08-06 NOTE — Progress Notes (Signed)
Location of Breast Cancer:Left breast, lower inner quadrant.Invasive lobular carcinoma  Histology per Pathology Report  07/16/2013 Diagnosis Breast, left, needle core biopsy, inner, 8 o'clock 9 cm from nipple, at inframammary fold - INVASIVE MAMMARY CARCINOMA. - MAMMARY CARCINOMA IN SITU. - SEE COMMENT.  08/04/2013 Microscopic Comment Diagnosis Lymph node, needle/core biopsy, Level I - ONE LYMPH NODE, NEGATIVE FOR TUMOR (0/1). SEE COMMENT. Microscopic Comment  Receptor Status: ER(+), PR (+), Her2-neu (-)  Did patient present with symptoms (if so, please note symptoms) or was this found on screening mammography?:Patient noted left breast soreness and mass near inframammary fold.  Past/Anticipated interventions by surgeon, if TNZ:DKEU to biopsy second lesion in left breast.  Past/Anticipated interventions by medical oncology, if any: Consultation with Dr.Magrinat on 08/13/2013.   Lymphedema issues, if any:No  Pain issues, if VHA:WUJNWMGE  SAFETY ISSUES:  Prior radiation? 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    Arlyss Repress, RN 08/06/2013,11:24 AM

## 2013-08-07 ENCOUNTER — Ambulatory Visit
Admission: RE | Admit: 2013-08-07 | Discharge: 2013-08-07 | Disposition: A | Discharge status: Home / Self Care | Payer: Medicare PPO | Source: Ambulatory Visit | Attending: Radiation Oncology | Admitting: Radiation Oncology

## 2013-08-07 ENCOUNTER — Ambulatory Visit: Payer: Medicare PPO

## 2013-08-07 ENCOUNTER — Encounter: Payer: Self-pay | Admitting: Radiation Oncology

## 2013-08-07 VITALS — BP 144/76 | HR 64 | Temp 98.3°F | Wt 172.7 lb

## 2013-08-07 DIAGNOSIS — Z803 Family history of malignant neoplasm of breast: Secondary | ICD-10-CM | POA: Insufficient documentation

## 2013-08-07 DIAGNOSIS — Z170 Estrogen receptor positive status [ER+]: Secondary | ICD-10-CM | POA: Insufficient documentation

## 2013-08-07 DIAGNOSIS — C50319 Malignant neoplasm of lower-inner quadrant of unspecified female breast: Secondary | ICD-10-CM | POA: Insufficient documentation

## 2013-08-07 DIAGNOSIS — Z79899 Other long term (current) drug therapy: Secondary | ICD-10-CM | POA: Insufficient documentation

## 2013-08-07 DIAGNOSIS — I10 Essential (primary) hypertension: Secondary | ICD-10-CM | POA: Insufficient documentation

## 2013-08-07 NOTE — Progress Notes (Signed)
Please see the Nurse Progress Note in the MD Initial Consult Encounter for this patient. 

## 2013-08-08 ENCOUNTER — Other Ambulatory Visit (INDEPENDENT_AMBULATORY_CARE_PROVIDER_SITE_OTHER): Payer: Self-pay | Admitting: General Surgery

## 2013-08-08 DIAGNOSIS — R928 Other abnormal and inconclusive findings on diagnostic imaging of breast: Secondary | ICD-10-CM

## 2013-08-08 NOTE — Telephone Encounter (Signed)
Pt is scheduled for MRI guided bx on 08/15/13.

## 2013-08-11 NOTE — Progress Notes (Signed)
Radiation Oncology         [phone]) [phone] ________________________________  Initial outpatient Consultation - Date: 08/07/2013   Name: Terri Ponce MRN: [id number]   DOB: May 24, 1928  REFERRING PHYSICIAN: Rolm Bookbinder, MD  DIAGNOSIS: T2 N0 invasive mammary carcinoma favoring lobular carcinoma of the left breast ER positive at 100% PR positive at 74% Ki-67 16% HER-2 negative  HISTORY OF PRESENT ILLNESS::Terri Ponce is a 80 y.o. female  who palpated a left breast mass and had some left breast pain. She was treated with antibiotics and the pain got better. When the mass remained she underwent an ultrasound on 07/04/2013 which showed a firm mass at the 8:00 position of the left breast. Ultrasound confirmed a 1.6 x 1.3 x 1.6 cm abnormality no lymphadenopathy was seen in the left axilla. An ultrasound core needle biopsy was performed on 07/16/2013 which showed an invasive mammary carcinoma favoring a lobular phenotype which was ER/PR positive HER-2 negative. An MRI of the bilateral breasts on 07/25/2013 showed a 3.2 x 1.6 x 2.2 cm enhancing mass extending posteriorly to the pectoralis muscle. It also appeared to invade the underlying skin. Medial and anterior to this was a 0.9 x 0.2 cm area of nodular enhancement. 2 nonspecific left internal mammary nodes the largest of which measuring 4 mm were noted. Given the inner location metastatic involvement was not ruled out. A mildly morphologic abnormal appearing 1 cm lymph node was noted in the left axilla. This was biopsied on 08/04/2013 and was negative for carcinoma. She presents with her daughter today. It she is the primary caregiver for her husband who is in his 54s. There the process of moving to [place] from [place]. She is not interested in aggressive treatment as her husband does not do well a home without her present. Currently she would likely require mastectomy given the location and her small breast size. The skin involvement as  well as the possible extension to the pectoralis muscle also concerning. She discussed neoadjuvant antiestrogen therapy with Dr. Donne Hazel. She is scheduled to see Dr. Jana Hakim next week. He reports no real pain associated with this. Her daughter states she is to be more active but really spends most of the time at home. She has not been as active and her [place] are swimming classes given her need to take her husband. She does perform all of her activities of daily living however. She reports no history of breast cancer. No history of radiation. Her mother did have breast cancer in her 62s and had surgery alone followed by tamoxifen.  PREVIOUS RADIATION THERAPY: No  PAST MEDICAL HISTORY:  has a past medical history of A-fib; Multiple thyroid nodules; Blood transfusion without reported diagnosis; and Hypertension.    PAST SURGICAL HISTORY: Past Surgical History  Procedure Laterality Date  . Ovarian cyst surgery  1956  . Intraocular lens insertion      right  . Wrist fracture surgery  July 2010    Left/titanium  . Left breast biopsy  07/16/2013    FAMILY HISTORY:  Family History  Problem Relation Age of Onset  . Arrhythmia Mother     A-Fib  . Hyperlipidemia Mother   . Hypertension Mother   . Cancer Mother     breast cancer  . Cancer Sister     SOCIAL HISTORY:  History  Substance Use Topics  . Smoking status: Never Smoker   . Smokeless tobacco: Not on file  . Alcohol Use: No    ALLERGIES:  Amoxicillin and Sulfonamide derivatives  MEDICATIONS:  Current Outpatient Prescriptions  Medication Sig Dispense Refill  . apixaban (ELIQUIS) 5 MG TABS tablet Take 1 tablet (5 mg total) by mouth 2 (two) times daily.  60 tablet  6  . calcium & magnesium carbonates (MYLANTA) 311-232 MG per tablet Take 1 tablet by mouth daily.      Marland Kitchen CRANBERRY PO Take by mouth. 8 oz cranberry juice per day      . diltiazem (CARDIZEM) 30 MG tablet Take 30 mg by mouth daily.      . Multiple Vitamin  (MULTIVITAMIN) capsule Take 1 capsule by mouth daily.      Marland Kitchen OVER THE COUNTER MEDICATION        No current facility-administered medications for this encounter.    REVIEW OF SYSTEMS:  A 15 point review of systems is documented in the electronic medical record. This was obtained by the nursing staff. However, I reviewed this with the patient to discuss relevant findings and make appropriate changes.  Pertinent items are noted in HPI.  PHYSICAL EXAM:  Filed Vitals:   08/07/13 1038  BP: 144/76  Pulse: 64  Temp: 98.3 F (36.8 C)  .172 lb 11.2 oz (78.336 kg). Pleasant female in no distress sitting comfortably on examining table. She appears her stated age. She is alert and oriented x3. No palpable abnormalities of the right breast. In the lower inner quadrant of the left breast there is a fixed palpable mass at approximately the 7:00 position. There is no bleeding lesion on the skin however there is a fleshy pearly area overlying the mass of about 1 cm with which may resent tumor invading through the skin. She has no lymphedema.  LABORATORY DATA:  No results found for this basename: WBC, HGB, HCT, MCV, PLT   No results found for this basename: NA, K, CL, CO2   No results found for this basename: ALT, AST, GGT, ALKPHOS, BILITOT     RADIOGRAPHY: Mr Breast Bilateral W Wo Contrast  07/25/2013   CLINICAL DATA:  80 year old female with recent diagnosis left breast invasive mammary carcinoma.  EXAM: BILATERAL BREAST MRI WITH AND WITHOUT CONTRAST  LABS:  BUN and creatinine were obtained on site at [facility] at  [address]  Results:  BUN 17 mg/dL,  Creatinine 0.7 mg/dL.  TECHNIQUE: Multiplanar, multisequence MR images of both breasts were obtained prior to and following the intravenous administration of 47m of MultiHance.  THREE-DIMENSIONAL MR IMAGE RENDERING ON INDEPENDENT WORKSTATION:  Three-dimensional MR images were rendered by post-processing of the original MR data on an  independent workstation. The three-dimensional MR images were interpreted, and findings are reported in the following complete MRI report for this study. Three dimensional images were evaluated at the independent DynaCad workstation  COMPARISON:  Previous exams  FINDINGS: Breast composition: a.  Almost entirely fat.  Background parenchymal enhancement: Mild  Right breast: No mass or abnormal enhancement.  Left breast: Within the far lower inner quadrant of the left breast in the inframammary fold there is a 3.2 x 1.6 x 2.2 cm irregular enhancing mass containing a biopsy marking clip compatible with recently biopsy proven right breast carcinoma. There a few spicules of enhancement which extend posteriorly to the level of the pectoralis muscle. Additionally, particularly demonstrated on the sagittal view, the enhancing mass immediately abuts and appears to involve a small section of overlying skin.  1.5 cm medial and anterior to the biopsy proven mass is a 0.9 x 0.2 cm  area of nodular enhancement.  Lymph nodes: There are two left internal mammary lymph nodes visualized, measuring 4 mm and 2 mm respectively. Note is made of a mildly morphologically abnormal appearing 1 cm lymph node within the left axilla.  Ancillary findings: Multiple large T2 bright nonenhancing masses demonstrated within the visualized aspect of the liver, measuring up to 6 cm. These are incompletely evaluated however favored to represent hepatic cysts.  IMPRESSION: Enhancing spiculated mass within the lower inner quadrant of the left breast compatible with recently biopsy proven invasive carcinoma. The posterior margin appears to extend to the pectoralis muscle and the inferior margin appears immediately adjacent to and likely involves the overlying skin.  Nonspecific 0.9 cm area of nodular enhancement medial and anterior to the biopsy proven mass. Consider further evaluation with MRI guided core needle biopsy as indicated given surgical planning.   Two nonspecific left internal mammary lymph nodes, the largest of which measures 4 mm. Disease to this location is not excluded.  Incompletely evaluated T2 bright nonenhancing masses within the liver are favored to represent cysts.  RECOMMENDATION: Treatment plan for known left breast malignancy.  Mildly morphologically abnormal 1 cm left axillary lymph node. Recommend second-look ultrasound for definitive characterization. If an abnormal lymph node is identified, consider core needle biopsy.  Nonspecific 0.9 cm area of nodular enhancement medial and anterior to the biopsy proven mass. Consider further evaluation with MRI guided core needle biopsy as indicated given surgical planning.  Consider further evaluation of internal mammary lymph nodes as clinically indicated.  BI-RADS CATEGORY  4: Suspicious abnormality - biopsy should be considered.   Electronically Signed   By: Lovey Newcomer M.D.   On: 07/25/2013 14:52   Mm Digital Diagnostic Unilat L  07/16/2013   CLINICAL DATA:  Ultrasound-guided core needle biopsy of a 1.6 cm shadowing mass in the far inner left breast.  EXAM: POST-BIOPSY CLIP PLACEMENT LEFT DIAGNOSTIC MAMMOGRAM  COMPARISON:  Previous exams.  FINDINGS: Films are performed following ultrasound guided biopsy of the 1.6 cm shadowing mass in the far inner left breast at the inframammary fold. The ribbon shaped tissue marker clip is appropriately positioned within the density on the recent mammogram.  IMPRESSION: Appropriate placement of the ribbon shaped tissue marker clip within the density on the recent mammogram in the far inner left breast, corresponding to the 1.6 cm shadowing mass on ultrasound.  Final Assessment: Post Procedure Mammograms for Marker Placement   Electronically Signed   By: Evangeline Dakin M.D.   On: 07/16/2013 16:42   US Breast Ltd Uni Left Inc Axilla  08/05/2013   ADDENDUM REPORT: 08/05/2013 12:14  ADDENDUM: PATHOLOGY: Benign, small amount of lymphoid tissue.  CONCORDANT:   Possibly not concordant.  I discussed these results and the recommendations below with the patient by telephone on 08/05/2013 at 1210 hours. All of her questions were answered. She denies significant pain or bleeding at the biopsy site.  RECOMMENDATION: The benign nature of the biopsy may be and indication that this is a reactive lymph node rather than a metastatic node. Repeat biopsy of the lymph node could be performed if this is needed for clinical management. Otherwise, the node can be followed up at the time of MRI in 6 months, after her neoadjuvant treatment.   Electronically Signed   By: Evangeline Dakin M.D.   On: 08/05/2013 12:14   08/05/2013   CLINICAL DATA:  Recent diagnosis of invasive lobular carcinoma and lobular carcinoma in situ after ultrasound-guided biopsy of a  mass in the far inner left breast at the inframammary fold. Pre-treatment MRI demonstrated a 0.9 x 0.2 cm nodular focus of enhancement approximately 2.5 cm anterior and medial to the dominant mass. A morphologically abnormal left axillary lymph node was also identified on MRI. Second-look ultrasound and possible biopsy was requested.  EXAM: ULTRASOUND GUIDED CORE NEEDLE BIOPSY OF A LEFT AXILLARY NODE  ULTRASOUND LEFT BREAST  COMPARISON:  Previous exams.  FINDINGS: Initially, a second-look ultrasound of the left breast was performed in an attempt to locate the nodular focus identified on MRI. Despite imaging of the entire inner left breast, a mass was not identified on ultrasound. I was able to confirm the skin involvement with tumor that was identified on the prior MRI.  Subsequently, a second-look ultrasound was performed in the left axilla, and I was able to identify the 1.0 cm morphologically abnormal lymph node in level 1. This lymph node was then biopsied.  I met with the patient and we discussed the procedure of ultrasound-guided biopsy, including benefits and alternatives. We discussed the high likelihood of a successful procedure.  We discussed the risks of the procedure, including infection, bleeding, tissue injury, the possibility of nerve injury in the axilla, and inadequate sampling. Informed written consent was given. The usual time-out protocol was performed immediately prior to the procedure.  Using sterile technique and 2% Lidocaine as local anesthetic, under direct ultrasound visualization, a 14 gauge spring-loaded device was used to perform biopsy of the 1.0 cm morphologically abnormal lymph node at level 1 in the left axilla using a medial approach.  IMPRESSION: 1. Ultrasound guided biopsy of a 1 cm morphologically abnormal level 1 left axillary lymph node. No apparent complications. 2. 2nd look ultrasound of the left breast failed to identify a sonographic correlate for the nodular enhancement anterior and medial to the dominant left breast mass on recent MRI. If definitive diagnosis of this nodular enhancement is required for treatment planning, MRI guided biopsy is recommended. 3. Tumor involvement of the skin of the medial breast at the site of the dominant mass was confirmed with ultrasound, as was also noted on the recent MRI.  Electronically Signed: By: Evangeline Dakin M.D. On: 08/04/2013 11:39   [place] Lt Breast Bx W Loc Dev 1st Lesion Img Bx Spec US Guide  08/05/2013   ADDENDUM REPORT: 08/05/2013 12:14  ADDENDUM: PATHOLOGY: Benign, small amount of lymphoid tissue.  CONCORDANT:  Possibly not concordant.  I discussed these results and the recommendations below with the patient by telephone on 08/05/2013 at 1210 hours. All of her questions were answered. She denies significant pain or bleeding at the biopsy site.  RECOMMENDATION: The benign nature of the biopsy may be and indication that this is a reactive lymph node rather than a metastatic node. Repeat biopsy of the lymph node could be performed if this is needed for clinical management. Otherwise, the node can be followed up at the time of MRI in 6 months, after her  neoadjuvant treatment.   Electronically Signed   By: Evangeline Dakin M.D.   On: 08/05/2013 12:14   08/05/2013   CLINICAL DATA:  Recent diagnosis of invasive lobular carcinoma and lobular carcinoma in situ after ultrasound-guided biopsy of a mass in the far inner left breast at the inframammary fold. Pre-treatment MRI demonstrated a 0.9 x 0.2 cm nodular focus of enhancement approximately 2.5 cm anterior and medial to the dominant mass. A morphologically abnormal left axillary lymph node was also identified on MRI. Second-look ultrasound  and possible biopsy was requested.  EXAM: ULTRASOUND GUIDED CORE NEEDLE BIOPSY OF A LEFT AXILLARY NODE  ULTRASOUND LEFT BREAST  COMPARISON:  Previous exams.  FINDINGS: Initially, a second-look ultrasound of the left breast was performed in an attempt to locate the nodular focus identified on MRI. Despite imaging of the entire inner left breast, a mass was not identified on ultrasound. I was able to confirm the skin involvement with tumor that was identified on the prior MRI.  Subsequently, a second-look ultrasound was performed in the left axilla, and I was able to identify the 1.0 cm morphologically abnormal lymph node in level 1. This lymph node was then biopsied.  I met with the patient and we discussed the procedure of ultrasound-guided biopsy, including benefits and alternatives. We discussed the high likelihood of a successful procedure. We discussed the risks of the procedure, including infection, bleeding, tissue injury, the possibility of nerve injury in the axilla, and inadequate sampling. Informed written consent was given. The usual time-out protocol was performed immediately prior to the procedure.  Using sterile technique and 2% Lidocaine as local anesthetic, under direct ultrasound visualization, a 14 gauge spring-loaded device was used to perform biopsy of the 1.0 cm morphologically abnormal lymph node at level 1 in the left axilla using a medial approach.   IMPRESSION: 1. Ultrasound guided biopsy of a 1 cm morphologically abnormal level 1 left axillary lymph node. No apparent complications. 2. 2nd look ultrasound of the left breast failed to identify a sonographic correlate for the nodular enhancement anterior and medial to the dominant left breast mass on recent MRI. If definitive diagnosis of this nodular enhancement is required for treatment planning, MRI guided biopsy is recommended. 3. Tumor involvement of the skin of the medial breast at the site of the dominant mass was confirmed with ultrasound, as was also noted on the recent MRI.  Electronically Signed: By: Evangeline Dakin M.D. On: 08/04/2013 11:39   [place] Lt Breast Bx W Loc Dev 1st Lesion Img Bx Spec US Guide  07/17/2013   ADDENDUM REPORT: 07/17/2013 16:13  ADDENDUM: PATHOLOGY ADDENDUM:  Pathology :  Breast, left, needle core biopsy, inner, 8 o'clock 9 cm from nipple, at inframammary fold  - INVASIVE MAMMARY CARCINOMA.  - MAMMARY CARCINOMA IN SITU. Additional stains are pending to determine if this represents lobular carcinoma.  Pathology concordance with imaging findings: Yes  Recommendation: Consultation regarding treatment plan. MRI is recommended. The patient is considering a Multidisciplinary Clinic appointment. Appointment will be scheduled for the patient on 07/18/2013.  At the request of the patient, I spoke with her by telephone on 07/17/2013 at 15:49. She reports doing well after the biopsy .   Electronically Signed   By: Shon Hale M.D.   On: 07/17/2013 16:13   07/17/2013   CLINICAL DATA:  80 year old with recent cellulitis in the left chest wall involving the inframammary fold, much improved after antibiotic therapy. Patient developed a mass in the far inner left breast near the inframammary fold, shown on recent ultrasound imaging to represent a 1.6 cm hypoechoic mass with shadowing.  EXAM: ULTRASOUND GUIDED LEFT BREAST CORE NEEDLE BIOPSY WITH VACUUM ASSIST  COMPARISON:  Previous exams.   PROCEDURE: I met with the patient and we discussed the procedure of ultrasound-guided biopsy, including benefits and alternatives. We discussed the high likelihood of a successful procedure. We discussed the risks of the procedure including infection, bleeding, tissue injury, clip migration, and inadequate sampling. Informed written consent was given. The usual time-out  protocol was performed immediately prior to the procedure.  Using sterile technique and 2% Lidocaine as local anesthetic, under direct ultrasound visualization, a 12 gauge vacuum-assisteddevice was used to perform biopsy of the shadowing hypoechoic mass in the inner left breast near the inframammary fold using a medial approach. At the conclusion of the procedure, a ribbon shaped tissue marker clip was deployed into the biopsy cavity. Follow-up 2-view mammogram was performed and dictated separately.  IMPRESSION: Ultrasound-guided biopsy of the 1.6 cm shadowing mass in the inner left breast near the inframammary fold. No apparent complications.  Electronically Signed: By: Evangeline Dakin M.D. On: 07/16/2013 16:25   IMPRESSION: T2 (with skin involvement but no ulceration)N0 (possible N3 with IM node involvement) Invasive mammary carcinoma of the left breast in an elderly patient  PLAN: I discussed with Ms. Schnitzer and her daughter the role of radiation and decreasing local failures in patients who undergo breast conservation. [facility] about neoadjuvant chemotherapy to help facilitate lumpectomy which would obviously be a year surgery on her. At this point she is a pretty poor candidate for lumpectomy. We discussed that given the skin involvement and extension to the pectoralis muscle as well as is questionable internal mammary nodes that I would recommend radiation. We discussed the results of randomized trials looking at omitting radiation in elderly patients with breast cancer and treating her with antiestrogen therapy alone. However, I think  she has more locally advanced disease especially with this area of skin involvement. For that write reason I would recommend radiation if at all feasible. She is in the process of moving here to [place] into it a single story home. We also discussed the role of radiation in postmastectomy setting if that's what she eventually ended up with. I think she would be fine to have a sentinel lymph node biopsy and is found to have any positive lymph nodes we could hopefully cleanup with radiation after that as well. She and her daughter at this point are just happy to go on neoadjuvant antiestrogen therapy and reassess in 3-6 months with Dr. Donne Hazel. We discussed the process of simulation and the placement tattoos. We discussed 6 weeks of treatment as an outpatient with skin redness and fatigue as the main side effects. She has relatively  Little else in terms of medical comorbidities I think being aggressive from a local approach could prevent her from having symptomatic recurrence in her lifetime. Her mother lived well into her 40s after being diagnosed breast cancer in her 59s. I will plan on seeing her back after she has completed her antiestrogen therapy and undergone surgery. I spent 40 minutes  face to face with the patient and more than 50% of that time was spent in counseling and/or coordination of care.   ------------------------------------------------  Thea Silversmith, MD

## 2013-08-11 NOTE — Progress Notes (Signed)
thanks

## 2013-08-13 ENCOUNTER — Telehealth: Payer: Self-pay | Admitting: Oncology

## 2013-08-13 ENCOUNTER — Ambulatory Visit (HOSPITAL_BASED_OUTPATIENT_CLINIC_OR_DEPARTMENT_OTHER): Payer: Medicare PPO | Admitting: Oncology

## 2013-08-13 ENCOUNTER — Ambulatory Visit: Payer: Medicare PPO

## 2013-08-13 ENCOUNTER — Encounter: Payer: Self-pay | Admitting: Oncology

## 2013-08-13 ENCOUNTER — Encounter: Payer: Self-pay | Admitting: *Deleted

## 2013-08-13 VITALS — BP 161/75 | HR 64 | Temp 99.0°F | Resp 18 | Ht 66.0 in | Wt 171.7 lb

## 2013-08-13 DIAGNOSIS — C50319 Malignant neoplasm of lower-inner quadrant of unspecified female breast: Secondary | ICD-10-CM

## 2013-08-13 DIAGNOSIS — K7689 Other specified diseases of liver: Secondary | ICD-10-CM

## 2013-08-13 DIAGNOSIS — I480 Paroxysmal atrial fibrillation: Secondary | ICD-10-CM

## 2013-08-13 DIAGNOSIS — Z170 Estrogen receptor positive status [ER+]: Secondary | ICD-10-CM

## 2013-08-13 MED ORDER — ANASTROZOLE 1 MG PO TABS: 1.0000 mg | ORAL_TABLET | Freq: Every day | ORAL | Status: DC

## 2013-08-13 NOTE — Progress Notes (Signed)
Checked in new patient with no financial issues. She has appt card. °

## 2013-08-13 NOTE — Telephone Encounter (Signed)
, °

## 2013-08-13 NOTE — Progress Notes (Signed)
Completed chart, no labs to be added, added to spreadsheet & placed chart in Dr. Virgie Dad box.

## 2013-08-13 NOTE — Progress Notes (Signed)
[facility]  [phone] [phone]     ID: Terri Ponce OB: 07/03/1928  MR#: [id number]  CSN#:[id number]  PCP: STONEKING,HAL THOMAS, MD GYN:   SU: Matthew Wakefield OTHER MD: Stacy Wentworth  CHIEF COMPLAINT:  BREAST CANCER HISTORY: Ms. Dewolfe had screening mammography December of 2012 showing a low density oval nodule at the 9:00 position which was not palpable and which by ultrasound appeared to be a cyst. 6 month reevaluation along the 11/29/2011 showed a small nodule to have entirely resolved. Return to screening mammography was recommended.  However in January of 2015 the patient noted a change in her left breast which he initially felt was due to refer her bra rubbing on her skin. She was set up for bilateral diagnostic mammography and left breast ultrasonography at the breast center 06/20/2013. There was an asymmetry in the left breast area of palpable concern in exam showed a firm raised red area of spanning 3 cm along the far medial left breast inframammary fold. Ultrasound confirmed an area of skin thickening in ill-defined change measuring 1.7 cm. This extended to the skin. It was felt to be possibly due to an infected sebaceous cyst, and the patient was given antibiotics for a week  On 07/04/2013 repeat ultrasound of the left breast showed an irregular shadowing mass at the 8:00 position measuring 1.6 cm. The left axilla showed no lymphadenopathy. Physical exam showed a firm mass in this area. Biopsy was recommended and the patient's Rivaroxaban was temporarily stopped. On 07/16/2013, biopsy of the left breast 8:00 area in question showed ([id number]) and invasive lobular carcinoma (E-cadherin negative), grade 1, estrogen receptor 100% positive, progesterone receptor 74% positive, with an MIB-1 of 16% and no HER-2 amplification, the signals ratio being 1.29 and the number per cell 2.25.  On 07/25/2013 the patient underwent bilateral breast  MRI. This showed in the lower inner quadrant of the left breast in the inframammary fold a 3.2 cm irregular enhancing mass containing a biopsy clip. Some spicules of enhancement extended posteriorly to the liver of the pectoralis. The mass abuts and appears to involve overlying skin. In addition, a 9 mm separate area of nodular enhancement was noted anterior and medial to the biopsied area. There were 2 left internal mammary lymph nodes measuring 4 mm and 2 mm. There was also a morphologically abnormal 1 cm lymph node in the left axilla. Incidental finding in the liver noted multiple masses favored to represent cysts.  On 08/04/2013 the patient underwent biopsy of the suspicious left axillary lymph node and this showed ([id number]) no evidence of malignancy. This was felt to be "possibly not concordant". Repeat MRI in 6 months (assuming neoadjuvant treatment) was recommended.  The patient has met with Dr. Wakefield and Dr. Wentworth. Dr. Wakefield have set the patient up for biopsy of the satellite lesion in the left breast. She feels the patient would benefit from neoadjuvant endocrine therapy. Dr. Wentworth feels the patient will need radiation, even if she eventually ends up having a mastectomy.  The patient's subsequent history is as detailed below.  INTERVAL HISTORY: "Terri Ponce" was evaluated in the breast clinic 08/13/2012 accompanied by her daughter Maria.  REVIEW OF SYSTEMS: She has tolerated her biopsies well so far. She does have some night sweats and tells me she never stopped having hot flashes. She uses a hearing 8. Occasionally she has a sore throat. She has a history of an irregular heartbeat and palpitations. She is tolerating her anticoagulants   without obvious bleeding concerns but she does bruise easily. She can be short of breath when climbing stairs, but enjoys yoga, water aerobics, and Silver sneakers. She has mild anxiety, but denies depression. A detailed review of systems today  was otherwise noncontributory  PAST MEDICAL HISTORY: Past Medical History  Diagnosis Date  . A-fib   . Multiple thyroid nodules   . Blood transfusion without reported diagnosis   . Hypertension    possible history of TIAs  PAST SURGICAL HISTORY: Past Surgical History  Procedure Laterality Date  . Ovarian cyst surgery  1956  . Intraocular lens insertion      right  . Wrist fracture surgery  July 2010    Left/titanium  . Left breast biopsy  07/16/2013    FAMILY HISTORY Family History  Problem Relation Age of Onset  . Arrhythmia Mother     A-Fib  . Hyperlipidemia Mother   . Hypertension Mother   . Cancer Mother     breast cancer  . Cancer Sister    the patient's father died at the age of 50, from complications of asbestosis. The patient's mother died at the age of 31. She had been diagnosed with breast cancer a few years before. The patient had no brothers, one sister. His sister died at age 65 from cancer which was metastatic but the patient does not know the primary  GYNECOLOGIC HISTORY:  Menarche age 72, first live birth age 33, the patient is [place] P2. She went through menopause at around age 69 or 89. She did not take hormone replacement. She took birth control pills for some years without any clotting complications  SOCIAL HISTORY:  Terri Ponce, Terri Ponce. Her husband Terri Ponce is an Chief Financial Officer and he worked for [employer] division. Daughter Terri Ponce (goes by "[name]") is a Architect with the [employer].  Son lives in [place] worry works as an Associate Professor. The patient has 2 grandchildren. She is a [employer]    ADVANCED DIRECTIVES:  in place    Ponce MAINTENANCE: History  Substance Use Topics  . Smoking status: Never Smoker   . Smokeless tobacco: Not on file  . Alcohol Use: No     Colonoscopy: Remote  PAP: remote  Bone density: 2014; "good"   Lipid panel:  Allergies  Allergen Reactions    . Amoxicillin     REACTION: abdominal pain  . Sulfonamide Derivatives     Current Outpatient Prescriptions  Medication Sig Dispense Refill  . anastrozole (ARIMIDEX) 1 MG tablet Take 1 tablet (1 mg total) by mouth daily.  90 tablet  4  . apixaban (ELIQUIS) 5 MG TABS tablet Take 1 tablet (5 mg total) by mouth 2 (two) times daily.  60 tablet  6  . calcium & magnesium carbonates (MYLANTA) 311-232 MG per tablet Take 1 tablet by mouth daily.      Marland Kitchen CRANBERRY PO Take by mouth. 8 oz cranberry juice per day      . diltiazem (CARDIZEM) 30 MG tablet Take 30 mg by mouth daily.      . Multiple Vitamin (MULTIVITAMIN) capsule Take 1 capsule by mouth daily.      Marland Kitchen OVER THE COUNTER MEDICATION        No current facility-administered medications for this visit.    OBJECTIVE: Elderly white woman in no acute distress Filed Vitals:   08/13/13 0914  BP: 161/75  Pulse: 64  Temp: 99 F (37.2 C)  Resp: 18     Body mass index is 27.73 kg/(m^2).    ECOG FS:1 - Symptomatic but completely ambulatory  Ocular: Sclerae unicteric, bilateral arcus senilis Ear-nose-throat: Oropharynx clear, dentition in good repair Lymphatic: No cervical or supraclavicular adenopathy Lungs no rales or rhonchi, good excursion bilaterally Heart rhythm seems regular today, no murmur appreciated Abd soft, nontender, positive bowel sounds, no hepatomegaly MSK no focal spinal tenderness, no joint edema Neuro: non-focal, well-oriented, pleasant affect Breasts: The right breast is unremarkable. In the inferior mammary fold of the left breast medial to the nipple area there is a 1 cm elongated area of erythema, and immediately adjacent to this is a palpable mass measuring approximately 2-2 and half centimeters. It is minimally movable. I do not palpate any left axillary adenopathy. There is no other area of skin change and no nipple findings of concern.   LAB RESULTS:  CMP  No results found for this basename: na,  k,  cl,  co2,   glucose,  bun,  creatinine,  calcium,  prot,  albumin,  ast,  alt,  alkphos,  bilitot,  gfrnonaa,  gfraa    I No results found for this basename: SPEP,  UPEP,   kappa and lambda light chains    No results found for this basename: WBC,  NEUTROABS,  HGB,  HCT,  MCV,  PLT      Chemistry   No results found for this basename: NA,  K,  CL,  CO2,  BUN,  CREATININE,  GLU   No results found for this basename: CALCIUM,  ALKPHOS,  AST,  ALT,  BILITOT       No results found for this basename: LABCA2    No components found with this basename: LABCA125    No results found for this basename: INR,  in the last 168 hours  Urinalysis No results found for this basename: colorurine,  appearanceur,  labspec,  phurine,  glucoseu,  hgbur,  bilirubinur,  ketonesur,  proteinur,  urobilinogen,  nitrite,  leukocytesur    STUDIES: Mr Breast Bilateral W Wo Contrast  07/25/2013   CLINICAL DATA:  82-year-old female with recent diagnosis left breast invasive mammary carcinoma.  EXAM: BILATERAL BREAST MRI WITH AND WITHOUT CONTRAST  LABS:  BUN and creatinine were obtained on site at [facility] at  [address]  Results:  BUN 17 mg/dL,  Creatinine 0.7 mg/dL.  TECHNIQUE: Multiplanar, multisequence MR images of both breasts were obtained prior to and following the intravenous administration of 17ml of MultiHance.  THREE-DIMENSIONAL MR IMAGE RENDERING ON INDEPENDENT WORKSTATION:  Three-dimensional MR images were rendered by post-processing of the original MR data on an independent workstation. The three-dimensional MR images were interpreted, and findings are reported in the following complete MRI report for this study. Three dimensional images were evaluated at the independent DynaCad workstation  COMPARISON:  Previous exams  FINDINGS: Breast composition: a.  Almost entirely fat.  Background parenchymal enhancement: Mild  Right breast: No mass or abnormal enhancement.  Left breast: Within the far lower  inner quadrant of the left breast in the inframammary fold there is a 3.2 x 1.6 x 2.2 cm irregular enhancing mass containing a biopsy marking clip compatible with recently biopsy proven right breast carcinoma. There a few spicules of enhancement which extend posteriorly to the level of the pectoralis muscle. Additionally, particularly demonstrated on the sagittal view, the enhancing mass immediately abuts and appears to involve a small section of overlying skin.  1.5 cm   medial and anterior to the biopsy proven mass is a 0.9 x 0.2 cm area of nodular enhancement.  Lymph nodes: There are two left internal mammary lymph nodes visualized, measuring 4 mm and 2 mm respectively. Note is made of a mildly morphologically abnormal appearing 1 cm lymph node within the left axilla.  Ancillary findings: Multiple large T2 bright nonenhancing masses demonstrated within the visualized aspect of the liver, measuring up to 6 cm. These are incompletely evaluated however favored to represent hepatic cysts.  IMPRESSION: Enhancing spiculated mass within the lower inner quadrant of the left breast compatible with recently biopsy proven invasive carcinoma. The posterior margin appears to extend to the pectoralis muscle and the inferior margin appears immediately adjacent to and likely involves the overlying skin.  Nonspecific 0.9 cm area of nodular enhancement medial and anterior to the biopsy proven mass. Consider further evaluation with MRI guided core needle biopsy as indicated given surgical planning.  Two nonspecific left internal mammary lymph nodes, the largest of which measures 4 mm. Disease to this location is not excluded.  Incompletely evaluated T2 bright nonenhancing masses within the liver are favored to represent cysts.  RECOMMENDATION: Treatment plan for known left breast malignancy.  Mildly morphologically abnormal 1 cm left axillary lymph node. Recommend second-look ultrasound for definitive characterization. If an abnormal  lymph node is identified, consider core needle biopsy.  Nonspecific 0.9 cm area of nodular enhancement medial and anterior to the biopsy proven mass. Consider further evaluation with MRI guided core needle biopsy as indicated given surgical planning.  Consider further evaluation of internal mammary lymph nodes as clinically indicated.  BI-RADS CATEGORY  4: Suspicious abnormality - biopsy should be considered.   Electronically Signed   By: Drew  Davis M.D.   On: 07/25/2013 14:52   Mm Digital Diagnostic Unilat L  07/16/2013   CLINICAL DATA:  Ultrasound-guided core needle biopsy of a 1.6 cm shadowing mass in the far inner left breast.  EXAM: POST-BIOPSY CLIP PLACEMENT LEFT DIAGNOSTIC MAMMOGRAM  COMPARISON:  Previous exams.  FINDINGS: Films are performed following ultrasound guided biopsy of the 1.6 cm shadowing mass in the far inner left breast at the inframammary fold. The ribbon shaped tissue marker clip is appropriately positioned within the density on the recent mammogram.  IMPRESSION: Appropriate placement of the ribbon shaped tissue marker clip within the density on the recent mammogram in the far inner left breast, corresponding to the 1.6 cm shadowing mass on ultrasound.  Final Assessment: Post Procedure Mammograms for Marker Placement   Electronically Signed   By: Thomas  Lawrence M.D.   On: 07/16/2013 16:42   Us Breast Ltd Uni Left Inc Axilla  08/05/2013   ADDENDUM REPORT: 08/05/2013 12:14  ADDENDUM: PATHOLOGY: Benign, small amount of lymphoid tissue.  CONCORDANT:  Possibly not concordant.  I discussed these results and the recommendations below with the patient by telephone on 08/05/2013 at 1210 hours. All of her questions were answered. She denies significant pain or bleeding at the biopsy site.  RECOMMENDATION: The benign nature of the biopsy may be and indication that this is a reactive lymph node rather than a metastatic node. Repeat biopsy of the lymph node could be performed if this is needed for  clinical management. Otherwise, the node can be followed up at the time of MRI in 6 months, after her neoadjuvant treatment.   Electronically Signed   By: Thomas  Lawrence M.D.   On: 08/05/2013 12:14   08/05/2013   CLINICAL DATA:  Recent diagnosis   of invasive lobular carcinoma and lobular carcinoma in situ after ultrasound-guided biopsy of a mass in the far inner left breast at the inframammary fold. Pre-treatment MRI demonstrated a 0.9 x 0.2 cm nodular focus of enhancement approximately 2.5 cm anterior and medial to the dominant mass. A morphologically abnormal left axillary lymph node was also identified on MRI. Second-look ultrasound and possible biopsy was requested.  EXAM: ULTRASOUND GUIDED CORE NEEDLE BIOPSY OF A LEFT AXILLARY NODE  ULTRASOUND LEFT BREAST  COMPARISON:  Previous exams.  FINDINGS: Initially, a second-look ultrasound of the left breast was performed in an attempt to locate the nodular focus identified on MRI. Despite imaging of the entire inner left breast, a mass was not identified on ultrasound. I was able to confirm the skin involvement with tumor that was identified on the prior MRI.  Subsequently, a second-look ultrasound was performed in the left axilla, and I was able to identify the 1.0 cm morphologically abnormal lymph node in level 1. This lymph node was then biopsied.  I met with the patient and we discussed the procedure of ultrasound-guided biopsy, including benefits and alternatives. We discussed the high likelihood of a successful procedure. We discussed the risks of the procedure, including infection, bleeding, tissue injury, the possibility of nerve injury in the axilla, and inadequate sampling. Informed written consent was given. The usual time-out protocol was performed immediately prior to the procedure.  Using sterile technique and 2% Lidocaine as local anesthetic, under direct ultrasound visualization, a 14 gauge spring-loaded device was used to perform biopsy of the 1.0 cm  morphologically abnormal lymph node at level 1 in the left axilla using a medial approach.  IMPRESSION: 1. Ultrasound guided biopsy of a 1 cm morphologically abnormal level 1 left axillary lymph node. No apparent complications. 2. 2nd look ultrasound of the left breast failed to identify a sonographic correlate for the nodular enhancement anterior and medial to the dominant left breast mass on recent MRI. If definitive diagnosis of this nodular enhancement is required for treatment planning, MRI guided biopsy is recommended. 3. Tumor involvement of the skin of the medial breast at the site of the dominant mass was confirmed with ultrasound, as was also noted on the recent MRI.  Electronically Signed: By: Thomas  Lawrence M.D. On: 08/04/2013 11:39   Us Lt Breast Bx W Loc Dev 1st Lesion Img Bx Spec Us Guide  08/05/2013   ADDENDUM REPORT: 08/05/2013 12:14  ADDENDUM: PATHOLOGY: Benign, small amount of lymphoid tissue.  CONCORDANT:  Possibly not concordant.  I discussed these results and the recommendations below with the patient by telephone on 08/05/2013 at 1210 hours. All of her questions were answered. She denies significant pain or bleeding at the biopsy site.  RECOMMENDATION: The benign nature of the biopsy may be and indication that this is a reactive lymph node rather than a metastatic node. Repeat biopsy of the lymph node could be performed if this is needed for clinical management. Otherwise, the node can be followed up at the time of MRI in 6 months, after her neoadjuvant treatment.   Electronically Signed   By: Thomas  Lawrence M.D.   On: 08/05/2013 12:14   08/05/2013   CLINICAL DATA:  Recent diagnosis of invasive lobular carcinoma and lobular carcinoma in situ after ultrasound-guided biopsy of a mass in the far inner left breast at the inframammary fold. Pre-treatment MRI demonstrated a 0.9 x 0.2 cm nodular focus of enhancement approximately 2.5 cm anterior and medial to the dominant mass. A    morphologically abnormal left axillary lymph node was also identified on MRI. Second-look ultrasound and possible biopsy was requested.  EXAM: ULTRASOUND GUIDED CORE NEEDLE BIOPSY OF A LEFT AXILLARY NODE  ULTRASOUND LEFT BREAST  COMPARISON:  Previous exams.  FINDINGS: Initially, a second-look ultrasound of the left breast was performed in an attempt to locate the nodular focus identified on MRI. Despite imaging of the entire inner left breast, a mass was not identified on ultrasound. I was able to confirm the skin involvement with tumor that was identified on the prior MRI.  Subsequently, a second-look ultrasound was performed in the left axilla, and I was able to identify the 1.0 cm morphologically abnormal lymph node in level 1. This lymph node was then biopsied.  I met with the patient and we discussed the procedure of ultrasound-guided biopsy, including benefits and alternatives. We discussed the high likelihood of a successful procedure. We discussed the risks of the procedure, including infection, bleeding, tissue injury, the possibility of nerve injury in the axilla, and inadequate sampling. Informed written consent was given. The usual time-out protocol was performed immediately prior to the procedure.  Using sterile technique and 2% Lidocaine as local anesthetic, under direct ultrasound visualization, a 14 gauge spring-loaded device was used to perform biopsy of the 1.0 cm morphologically abnormal lymph node at level 1 in the left axilla using a medial approach.  IMPRESSION: 1. Ultrasound guided biopsy of a 1 cm morphologically abnormal level 1 left axillary lymph node. No apparent complications. 2. 2nd look ultrasound of the left breast failed to identify a sonographic correlate for the nodular enhancement anterior and medial to the dominant left breast mass on recent MRI. If definitive diagnosis of this nodular enhancement is required for treatment planning, MRI guided biopsy is recommended. 3. Tumor  involvement of the skin of the medial breast at the site of the dominant mass was confirmed with ultrasound, as was also noted on the recent MRI.  Electronically Signed: By: Thomas  Lawrence M.D. On: 08/04/2013 11:39   Us Lt Breast Bx W Loc Dev 1st Lesion Img Bx Spec Us Guide  07/17/2013   ADDENDUM REPORT: 07/17/2013 16:13  ADDENDUM: PATHOLOGY ADDENDUM:  Pathology :  Breast, left, needle core biopsy, inner, 8 o'clock 9 cm from nipple, at inframammary fold  - INVASIVE MAMMARY CARCINOMA.  - MAMMARY CARCINOMA IN SITU. Additional stains are pending to determine if this represents lobular carcinoma.  Pathology concordance with imaging findings: Yes  Recommendation: Consultation regarding treatment plan. MRI is recommended. The patient is considering a Multidisciplinary Clinic appointment. Appointment will be scheduled for the patient on 07/18/2013.  At the request of the patient, I spoke with her by telephone on 07/17/2013 at 15:49. She reports doing well after the biopsy .   Electronically Signed   By: Beth  Brown M.D.   On: 07/17/2013 16:13   07/17/2013   CLINICAL DATA:  84-year-old with recent cellulitis in the left chest wall involving the inframammary fold, much improved after antibiotic therapy. Patient developed a mass in the far inner left breast near the inframammary fold, shown on recent ultrasound imaging to represent a 1.6 cm hypoechoic mass with shadowing.  EXAM: ULTRASOUND GUIDED LEFT BREAST CORE NEEDLE BIOPSY WITH VACUUM ASSIST  COMPARISON:  Previous exams.  PROCEDURE: I met with the patient and we discussed the procedure of ultrasound-guided biopsy, including benefits and alternatives. We discussed the high likelihood of a successful procedure. We discussed the risks of the procedure including infection, bleeding, tissue injury, clip   migration, and inadequate sampling. Informed written consent was given. The usual time-out protocol was performed immediately prior to the procedure.  Using sterile  technique and 2% Lidocaine as local anesthetic, under direct ultrasound visualization, a 12 gauge vacuum-assisteddevice was used to perform biopsy of the shadowing hypoechoic mass in the inner left breast near the inframammary fold using a medial approach. At the conclusion of the procedure, a ribbon shaped tissue marker clip was deployed into the biopsy cavity. Follow-up 2-view mammogram was performed and dictated separately.  IMPRESSION: Ultrasound-guided biopsy of the 1.6 cm shadowing mass in the inner left breast near the inframammary fold. No apparent complications.  Electronically Signed: By: Thomas  Lawrence M.D. On: 07/16/2013 16:25    ASSESSMENT: 80 y.o. [place] woman status post left breast biopsy 07/16/2013 for a clinically multifocal T2 N0-3 (stage IIA-IIIC) invasive lobular breast cancer, estrogen receptor 100% positive, progesterone receptor 74% positive, with an MIB-1 of 16% and no HER-2 amplification  (1) biopsy of a suspicious left axillary lymph node 08/04/2013 was negative, but possibly discordant  (2) the patient has 2 suspicious left internal mammary lymph nodes as well as some indeterminate liver lesions which will require further evaluation  (3) MRI biopsy of the satellite lesion in the left breast has been scheduled  PLAN: We spent the better part of today's hour-long appointment discussing the biology of breast cancer in general, and the specifics of the patient's tumor in particular. Almas Ponce understands lobular cancers can be harder to detect, and frequently are multifocal, as hers appears to be, by the time they are found. She has already met with Dr. Wakefield and the patient is hoping that breast conservation will be possible but accepts the fact that mastectomy might end up being necessary. She also has met with radiation oncology and accepts the fact of whether she has a lumpectomy or mastectomy she will need radiation therapy.  She also understands that whether we  start with systemic treatment or surgery does not affect the final outcome. In her case we think starting with systemic therapy is better because we will make the surgery easier and hopefully make a lumpectomy possible.  I do think she needs to be staged and I have set her up for a PET scan. This will give us information on the regional lymph nodes and clear up the issue of the indeterminate liver lesions, which most likely represent cysts. My expectation is that after the PET scan we will feel more comfortable calling her cancer or stage IIb.  We then discussed the systemic treatment options. She is not a candidate for anti-HER-2 therapy. We would prefer to not use chemotherapy in her case if not all of or double. Accordingly we are going to start with anti-estrogens, and given the history of A. fib and possible TIAs in the past we are going to stay away from tamoxifen.  We discussed the possible toxicities, side effects and complications of anastrozole. The patient had a bone scan last year which she tells me was "good". We will obtain that report. In the meantime I am starting her on anastrozole as of now and she will return to see me in 3 months. Assuming we are seeing a response the plan will be to continue anti-estrogens at least for 6 months at which point we will repeat a breast MRI and again reconsider her surgical options.  Taylormarie Ponce has a good understanding of the overall plan. She agrees with it. She knows the intent of treatment   in her case is cure. She will call for results of the PET scan, and also for discussion of any problems that may develop before her next visit here.   MAGRINAT,GUSTAV C, MD   08/13/2013 9:15 AM   

## 2013-08-14 ENCOUNTER — Encounter: Payer: Self-pay | Admitting: *Deleted

## 2013-08-14 NOTE — Progress Notes (Unsigned)
[facility] Psychosocial Distress Screening Clinical Social Work  Clinical Social Work was referred by distress screening protocol.  The patient scored a 5 on the Psychosocial Distress Thermometer which indicates moderate distress. Clinical Social Worker attempted to phone pt to assess for distress and other psychosocial needs. CSW left vm and will continue to try to reach out to pt.   Clinical Social Worker follow up needed: yes  If yes, follow up plan: Continue to try to call  Loren Racer, [place] Worker Josephina Shih. [place] for [place] Wednesday, Thursday and Friday Phone: [phone] Fax: [phone]

## 2013-08-15 ENCOUNTER — Ambulatory Visit
Admission: RE | Admit: 2013-08-15 | Discharge: 2013-08-15 | Disposition: A | Discharge status: Home / Self Care | Payer: Medicare PPO | Source: Ambulatory Visit | Attending: General Surgery | Admitting: General Surgery

## 2013-08-15 DIAGNOSIS — R928 Other abnormal and inconclusive findings on diagnostic imaging of breast: Secondary | ICD-10-CM

## 2013-08-15 MED ORDER — GADOBENATE DIMEGLUMINE 529 MG/ML IV SOLN
16.0000 mL | Freq: Once | INTRAVENOUS | Status: AC | PRN
  Administered 2013-08-15: 16 mL via INTRAVENOUS

## 2013-08-16 ENCOUNTER — Other Ambulatory Visit: Payer: Self-pay | Admitting: Oncology

## 2013-08-16 DIAGNOSIS — C50919 Malignant neoplasm of unspecified site of unspecified female breast: Secondary | ICD-10-CM

## 2013-08-18 ENCOUNTER — Other Ambulatory Visit: Payer: Self-pay | Admitting: Oncology

## 2013-08-26 ENCOUNTER — Ambulatory Visit (HOSPITAL_COMMUNITY)
Admission: RE | Admit: 2013-08-26 | Discharge: 2013-08-26 | Disposition: A | Discharge status: Home / Self Care | Payer: Medicare PPO | Source: Ambulatory Visit | Attending: Oncology | Admitting: Oncology

## 2013-08-26 ENCOUNTER — Encounter (HOSPITAL_COMMUNITY): Payer: Self-pay

## 2013-08-26 DIAGNOSIS — C50919 Malignant neoplasm of unspecified site of unspecified female breast: Secondary | ICD-10-CM

## 2013-08-26 DIAGNOSIS — K7689 Other specified diseases of liver: Secondary | ICD-10-CM | POA: Insufficient documentation

## 2013-08-26 DIAGNOSIS — I517 Cardiomegaly: Secondary | ICD-10-CM | POA: Insufficient documentation

## 2013-08-26 DIAGNOSIS — I2510 Atherosclerotic heart disease of native coronary artery without angina pectoris: Secondary | ICD-10-CM | POA: Insufficient documentation

## 2013-08-26 DIAGNOSIS — E041 Nontoxic single thyroid nodule: Secondary | ICD-10-CM | POA: Insufficient documentation

## 2013-08-26 DIAGNOSIS — K5730 Diverticulosis of large intestine without perforation or abscess without bleeding: Secondary | ICD-10-CM | POA: Insufficient documentation

## 2013-08-26 LAB — GLUCOSE, CAPILLARY: Glucose-Capillary: 90 mg/dL (ref 70–99)

## 2013-08-26 MED ORDER — FLUDEOXYGLUCOSE F - 18 (FDG) INJECTION
9.2000 | Freq: Once | INTRAVENOUS | Status: AC | PRN
  Administered 2013-08-26: 9.2 via INTRAVENOUS

## 2013-09-02 ENCOUNTER — Other Ambulatory Visit: Payer: Self-pay | Admitting: Oncology

## 2013-09-03 ENCOUNTER — Telehealth: Payer: Self-pay

## 2013-09-03 ENCOUNTER — Telehealth: Payer: Self-pay | Admitting: Oncology

## 2013-09-03 NOTE — Telephone Encounter (Signed)
Terri Ponce in scheduling 1400 the  time of appointment on the 24 as Dr. Nat Math told this nurse but was not noted on POF on 09-02-13.

## 2013-09-03 NOTE — Telephone Encounter (Signed)
Told Ms. Raiche that Dr. Jana Hakim stated that the Pet scan showed no evidence of metastatic disease.  He will discuss details at visit on 09-12-13 at 1400. Gave appt. time to patient.  Dr. Jana Hakim sent a POF to schedulers to book appointment. Ms. Gaylord verbalized understanding.

## 2013-09-03 NOTE — Telephone Encounter (Signed)
, °

## 2013-09-08 ENCOUNTER — Telehealth: Payer: Self-pay | Admitting: *Deleted

## 2013-09-08 NOTE — Telephone Encounter (Signed)
Pt called stating that her grandson is graduating on Friday, 4/24 and she cannot make her appt.  Confirmed 09/16/13 appt w/ pt.

## 2013-09-12 ENCOUNTER — Ambulatory Visit: Payer: Medicare PPO | Admitting: Oncology

## 2013-09-16 ENCOUNTER — Ambulatory Visit (HOSPITAL_BASED_OUTPATIENT_CLINIC_OR_DEPARTMENT_OTHER): Payer: Medicare PPO | Admitting: Oncology

## 2013-09-16 ENCOUNTER — Telehealth: Payer: Self-pay | Admitting: Oncology

## 2013-09-16 VITALS — BP 167/71 | HR 74 | Temp 97.7°F | Resp 18 | Ht 66.0 in | Wt 171.1 lb

## 2013-09-16 DIAGNOSIS — I10 Essential (primary) hypertension: Secondary | ICD-10-CM

## 2013-09-16 DIAGNOSIS — I480 Paroxysmal atrial fibrillation: Secondary | ICD-10-CM

## 2013-09-16 DIAGNOSIS — I4891 Unspecified atrial fibrillation: Secondary | ICD-10-CM

## 2013-09-16 DIAGNOSIS — C50319 Malignant neoplasm of lower-inner quadrant of unspecified female breast: Secondary | ICD-10-CM

## 2013-09-16 NOTE — Telephone Encounter (Signed)
, °

## 2013-09-16 NOTE — Progress Notes (Signed)
[facility]  Telephone:([phone]) [phone] Fax:(336) [phone]     ID: [name] OB: 1928-09-16  MR#: [id number]  VOJ#:[id number]  PCP: Mathews Argyle, MD GYN:   SU: Rolm Bookbinder OTHER MD: Thea Silversmith  CHIEF COMPLAINT:  BREAST CANCER HISTORY: Ms. Riebe had screening mammography December of 2012 showing a low density oval nodule at the 9:00 position which was not palpable and which by ultrasound appeared to be a cyst. 6 month reevaluation along the 11/29/2011 showed a small nodule to have entirely resolved. Return to screening mammography was recommended.  However in January of 2015 the patient noted a change in her left breast which he initially felt was due to refer her bra rubbing on her skin. She was set up for bilateral diagnostic mammography and left breast ultrasonography at the breast center 06/20/2013. There was an asymmetry in the left breast area of palpable concern in exam showed a firm raised red area of spanning 3 cm along the far medial left breast inframammary fold. Ultrasound confirmed an area of skin thickening in ill-defined change measuring 1.7 cm. This extended to the skin. It was felt to be possibly due to an infected sebaceous cyst, and the patient was given antibiotics for a week  On 07/04/2013 repeat ultrasound of the left breast showed an irregular shadowing mass at the 8:00 position measuring 1.6 cm. The left axilla showed no lymphadenopathy. Physical exam showed a firm mass in this area. Biopsy was recommended and the patient's Rivaroxaban was temporarily stopped. On 07/16/2013, biopsy of the left breast 8:00 area in question showed ([id number]) and invasive lobular carcinoma (E-cadherin negative), grade 1, estrogen receptor 100% positive, progesterone receptor 74% positive, with an MIB-1 of 16% and no HER-2 amplification, the signals ratio being 1.29 and the number per cell 2.25.  On 07/25/2013 the patient underwent bilateral breast  MRI. This showed in the lower inner quadrant of the left breast in the inframammary fold a 3.2 cm irregular enhancing mass containing a biopsy clip. Some spicules of enhancement extended posteriorly to the liver of the pectoralis. The mass abuts and appears to involve overlying skin. In addition, a 9 mm separate area of nodular enhancement was noted anterior and medial to the biopsied area. There were 2 left internal mammary lymph nodes measuring 4 mm and 2 mm. There was also a morphologically abnormal 1 cm lymph node in the left axilla. Incidental finding in the liver noted multiple masses favored to represent cysts.  On 08/04/2013 the patient underwent biopsy of the suspicious left axillary lymph node and this showed ([id number]) no evidence of malignancy. This was felt to be "possibly not concordant". Repeat MRI in 6 months (assuming neoadjuvant treatment) was recommended.  The patient has met with Dr. Donne Hazel and Dr. Pablo Ledger. Dr. Donne Hazel have set the patient up for biopsy of the satellite lesion in the left breast. She feels the patient would benefit from neoadjuvant endocrine therapy. Dr. Pablo Ledger feels the patient will need radiation, even if she eventually ends up having a mastectomy.  The patient's subsequent history is as detailed below.  INTERVAL HISTORY: "Terri Ponce" returns today for followup of her breast cancer accompanied by her daughter [name]. After her last visit here the patient started anastrozole. She is tolerating it well. She gets "warm at times", but doesn't have hot flashes or night sweats. Vaginal dryness has not been an issue and she has not developed any arthralgias or myalgias.  REVIEW OF SYSTEMS: She has become reasonable in  her old age". By that she means that she has accepted her cancer diagnosis and is now able to talk about it. She has discussed at with her church group and has received quite a bit of support as well as many questions and request for information.  She feels this is helping other sextant their own problems and indeed possibly accept breast cancer when it does develop in that case. In terms of symptoms she is a little bit hoarse at times, she has a history of palpitations, she short of breath when walking up stairs but getting up to the top without stopping, she bruises easily. None of these are new symptoms or none of them are particularly more persistent or intense than prior  PAST MEDICAL HISTORY: Past Medical History  Diagnosis Date  . A-fib   . Multiple thyroid nodules   . Blood transfusion without reported diagnosis   . Hypertension    possible history of TIAs  PAST SURGICAL HISTORY: Past Surgical History  Procedure Laterality Date  . Ovarian cyst surgery  1956  . Intraocular lens insertion      right  . Wrist fracture surgery  July 2010    Left/titanium  . Left breast biopsy  07/16/2013    FAMILY HISTORY Family History  Problem Relation Age of Onset  . Arrhythmia Mother     A-Fib  . Hyperlipidemia Mother   . Hypertension Mother   . Cancer Mother     breast cancer  . Cancer Sister    the patient's father died at the age of 12, from complications of asbestosis. The patient's mother died at the age of 49. She had been diagnosed with breast cancer a few years before. The patient had no brothers, one sister. His sister died at age 5 from cancer which was metastatic but the patient does not know the primary  GYNECOLOGIC HISTORY:  Menarche age 50, first live birth age 64, the patient is [place] P2. She went through menopause at around age 65 or 41. She did not take hormone replacement. She took birth control pills for some years without any clotting complications  SOCIAL HISTORY:  Terri Ponce has a Masters degree in public health, specifically nutrition. Her husband Terri Ponce is an Chief Financial Officer and he worked for [employer] division. Daughter Terri Ponce (goes by "[name]") is a Architect with the [employer].  Son lives  in [place] worry works as an Associate Professor. The patient has 2 grandchildren. She is a [employer]    ADVANCED DIRECTIVES:  in place    HEALTH MAINTENANCE: History  Substance Use Topics  . Smoking status: Never Smoker   . Smokeless tobacco: Not on file  . Alcohol Use: No     Colonoscopy: Remote  PAP: remote  Bone density: 2014; "good"   Lipid panel:  Allergies  Allergen Reactions  . Amoxicillin     REACTION: abdominal pain  . Sulfonamide Derivatives     Current Outpatient Prescriptions  Medication Sig Dispense Refill  . anastrozole (ARIMIDEX) 1 MG tablet Take 1 tablet (1 mg total) by mouth daily.  90 tablet  4  . apixaban (ELIQUIS) 5 MG TABS tablet Take 1 tablet (5 mg total) by mouth 2 (two) times daily.  60 tablet  6  . calcium & magnesium carbonates (MYLANTA) 311-232 MG per tablet Take 1 tablet by mouth daily.      Marland Kitchen CRANBERRY PO Take by mouth. 8 oz cranberry juice per day      .  diltiazem (CARDIZEM) 30 MG tablet Take 30 mg by mouth daily.      . Multiple Vitamin (MULTIVITAMIN) capsule Take 1 capsule by mouth daily.      Marland Kitchen OVER THE COUNTER MEDICATION        No current facility-administered medications for this visit.    OBJECTIVE: Elderly white womanwho appears well  Filed Vitals:   09/16/13 1349  BP: 167/71  Pulse: 74  Temp: 97.7 F (36.5 C)  Resp: 18     Body mass index is 27.63 kg/(m^2).    ECOG FS:0 - Asymptomatic  Ocular: Sclerae unicteric,  EOMs intact  Ear-nose-throat: Oropharynx clear and moist  Lymphatic: Given the PET findings said that careful exam of the left cervical and supraclavicular area. I do not palpate any nodes in that area. The patient is concerned about bilateral submandibular glands which are slightly enlarged. These are not of concern.  Lungs no rales or rhonchi, good excursion bilaterally Heart rhythm  Andrade regular, no murmur appreciated Abd soft, nontender, positive bowel sounds, no hepatomegaly MSK no focal spinal tenderness,  no joint edema Neuro: non-focal, well-oriented, pleasant affect Breasts: The right breast is unremarkable. In the inferior mammary fold of the left breast medial to the nipple area there was   and easily palpable mass measuring approximately 2 and half centimeters. This is now almost not at all apparent. There is a little bit of thickening in that area. The erythematous portions previously noted is now a slightly pinkish area which may indeed be due to the patient bra. There are no other skin or nipple changes of concern in the left axilla is benign.  LAB RESULTS:  CMP  No results found for this basename: na,  k,  cl,  co2,  glucose,  bun,  creatinine,  calcium,  prot,  albumin,  ast,  alt,  alkphos,  bilitot,  gfrnonaa,  gfraa    I No results found for this basename: SPEP,  UPEP,   kappa and lambda light chains    No results found for this basename: WBC,  NEUTROABS,  HGB,  HCT,  MCV,  PLT      Chemistry   No results found for this basename: NA,  K,  CL,  CO2,  BUN,  CREATININE,  GLU   No results found for this basename: CALCIUM,  ALKPHOS,  AST,  ALT,  BILITOT       No results found for this basename: LABCA2    No components found with this basename: LABCA125    No results found for this basename: INR,  in the last 168 hours  Urinalysis No results found for this basename: colorurine,  appearanceur,  labspec,  phurine,  glucoseu,  hgbur,  bilirubinur,  ketonesur,  proteinur,  urobilinogen,  nitrite,  leukocytesur    STUDIES: Nm Pet Image Initial (pi) Skull Base To Thigh  08/26/2013   CLINICAL DATA:  Initial treatment strategy for staging of breast cancer.  EXAM: NUCLEAR MEDICINE PET SKULL BASE TO THIGH  TECHNIQUE: 9.2 mCi F-18 FDG was injected intravenously. Full-ring PET imaging was performed from the skull base to thigh after the radiotracer. CT data was obtained and used for attenuation correction and anatomic localization.  FASTING BLOOD GLUCOSE:  Value: 90 mg/dl  COMPARISON:   MR BREAST BILAT WO/W CM dated 07/25/2013  FINDINGS: NECK  A left-sided level 2 node which measures 6 mm and a S.U.V. max of 3.9 on image 28/series 4.  CHEST  Left axillary node which  measures 9 mm and a S.U.V. max of 1.5 on image 55. This corresponds to the morphologically abnormal left axillary node on the prior MRI.  No internal mammary hypermetabolism, including at the site of small nodes.  Hypermetabolism about the medial left breast. This corresponds to an area of soft tissue thickening. Measures a S.U.V. max of 2.3 on image 81. This could be disease or biopsy related. This is medial to the more well-defined soft tissue component which measures 2.3 x 1.3 cm on image 80 (with a biopsy clip about its lateral and inferior portion).  ABDOMEN/PELVIS  No abnormal nodal activity within the abdomen or pelvis. There is hypermetabolism which corresponds to either a cystocele or a urethral diverticulum superimposed upon pelvic floor laxity. Example image 182/series 4.  SKELETON  No abnormal marrow activity.  CT IMAGES PERFORMED FOR ATTENUATION CORRECTION  No further findings within the neck.  Subcentimeter low-density right thyroid nodule, nonspecific. Mild cardiomegaly. Atherosclerosis, including within the coronary arteries. Minimal left upper lobe nodularity at 1 mm on image 26 and likely at 3 mm on image 35.  Normal adrenal glands. Left renal interpolar 13 mm lesion which is likely a cyst.  Multiple hepatic cysts, the largest of which measures 6.5 cm in the right lobe. Complex left hepatic lobe cyst with calcification. Old granulomatous disease in the spleen. Pelvic floor laxity. Scattered colonic diverticula. Scattered pelvic bone islands.  IMPRESSION: 1. Low-level, non malignant range hypermetabolism which corresponds to a subcentimeter rounded left axillary node. This is indeterminate. This could be sampled or re-evaluated on follow-up. 2. Hypermetabolic but not pathologically enlarged left-sided cervical node. Not  in the initial expected drainage CT for breast cancer. Considerations include an atypical appearance of nodal metastasis versus reactive node. As this would likely be too small to sampled percutaneously, imaging follow-up may be necessary. 3. Otherwise, no evidence of metastatic disease. 4. Nonspecific left upper lobe nodularity. 5. Pelvic floor laxity with superimposed urethral diverticulum versus cystocele.   Electronically Signed   By: Abigail Miyamoto M.D.   On: 08/26/2013 12:34  ASSESSMENT: 80 y.o. [place] woman status post left breast biopsy 07/16/2013 for a clinically multifocal T2 N0-3 (stage IIA-IIIC) invasive lobular breast cancer, estrogen receptor 100% positive, progesterone receptor 74% positive, with an MIB-1 of 16% and no HER-2 amplification  (1) biopsy of a suspicious left axillary lymph node 08/04/2013 was negative, but possibly discordant  (2) the patient has 2 suspicious left internal mammary lymph nodes as well as some indeterminate liver lesions which will require further evaluation  (3) MRI biopsy of a satellite lesion in the left breast 08/15/2013 showed atypical lobular hyperplasia.  (4) anastrozole started 08/13/2048  PLAN: Sarai January is tolerating the anastrozole well. There is clinical evidence of a good initial response. The plan is to continue anastrozole and she will see me again in 2 months. Before that visit we will have a repeat breast MRI. Likely we will continue anastrozole for approximately 6 months before proceeding to definitive surgery.  The patient and her daughter have a good understanding of the overall plan. They agree with it. They know to call for any problems that may develop before next visit here.   Chauncey Cruel, MD   09/16/2013 1:57 PM

## 2013-09-17 ENCOUNTER — Other Ambulatory Visit: Payer: Self-pay | Admitting: *Deleted

## 2013-09-17 ENCOUNTER — Telehealth: Payer: Self-pay | Admitting: *Deleted

## 2013-09-17 DIAGNOSIS — C50319 Malignant neoplasm of lower-inner quadrant of unspecified female breast: Secondary | ICD-10-CM

## 2013-09-17 NOTE — Telephone Encounter (Signed)
Scheduled breast MRI for patient at [facility] for Monday, June 8th at 08:00 am. Patient to have labs drawn at [facility] on Thursday, June 4th at 3:00 pm (per patient request). Patient to see Dr. Doris Cheadle on Wednesday, June 10th. Patient is aware of date and time for all appointments. Patient verbalized understanding.

## 2013-09-18 ENCOUNTER — Telehealth: Payer: Self-pay | Admitting: Oncology

## 2013-09-18 NOTE — Telephone Encounter (Signed)
, °

## 2013-10-23 ENCOUNTER — Other Ambulatory Visit (HOSPITAL_BASED_OUTPATIENT_CLINIC_OR_DEPARTMENT_OTHER): Payer: Medicare PPO

## 2013-10-23 DIAGNOSIS — C50319 Malignant neoplasm of lower-inner quadrant of unspecified female breast: Secondary | ICD-10-CM

## 2013-10-23 LAB — CBC WITH DIFFERENTIAL/PLATELET
BASO%: 1 % (ref 0.0–2.0)
Basophils Absolute: 0.1 10*3/uL (ref 0.0–0.1)
EOS ABS: 0.2 10*3/uL (ref 0.0–0.5)
EOS%: 2.7 % (ref 0.0–7.0)
HCT: 41.2 % (ref 34.8–46.6)
HEMOGLOBIN: 13.6 g/dL (ref 11.6–15.9)
LYMPH%: 31.5 % (ref 14.0–49.7)
MCH: 29.2 pg (ref 25.1–34.0)
MCHC: 32.9 g/dL (ref 31.5–36.0)
MCV: 88.8 fL (ref 79.5–101.0)
MONO#: 0.6 10*3/uL (ref 0.1–0.9)
MONO%: 6.6 % (ref 0.0–14.0)
NEUT#: 5.4 10*3/uL (ref 1.5–6.5)
NEUT%: 58.2 % (ref 38.4–76.8)
PLATELETS: 268 10*3/uL (ref 145–400)
RBC: 4.64 10*6/uL (ref 3.70–5.45)
RDW: 13.5 % (ref 11.2–14.5)
WBC: 9.2 10*3/uL (ref 3.9–10.3)
lymph#: 2.9 10*3/uL (ref 0.9–3.3)

## 2013-10-23 LAB — COMPREHENSIVE METABOLIC PANEL (CC13)
ALBUMIN: 4.1 g/dL (ref 3.5–5.0)
ALK PHOS: 80 U/L (ref 40–150)
ALT: 19 U/L (ref 0–55)
AST: 19 U/L (ref 5–34)
Anion Gap: 14 mEq/L — ABNORMAL HIGH (ref 3–11)
BUN: 20.1 mg/dL (ref 7.0–26.0)
CALCIUM: 9.7 mg/dL (ref 8.4–10.4)
CO2: 22 mEq/L (ref 22–29)
CREATININE: 0.8 mg/dL (ref 0.6–1.1)
Chloride: 107 mEq/L (ref 98–109)
Glucose: 104 mg/dl (ref 70–140)
POTASSIUM: 4.2 meq/L (ref 3.5–5.1)
Sodium: 144 mEq/L (ref 136–145)
Total Bilirubin: 0.36 mg/dL (ref 0.20–1.20)
Total Protein: 6.7 g/dL (ref 6.4–8.3)

## 2013-10-27 ENCOUNTER — Ambulatory Visit
Admission: RE | Admit: 2013-10-27 | Discharge: 2013-10-27 | Disposition: A | Discharge status: Home / Self Care | Payer: Medicare PPO | Source: Ambulatory Visit | Attending: Oncology | Admitting: Oncology

## 2013-10-27 DIAGNOSIS — I480 Paroxysmal atrial fibrillation: Secondary | ICD-10-CM

## 2013-10-27 DIAGNOSIS — C50319 Malignant neoplasm of lower-inner quadrant of unspecified female breast: Secondary | ICD-10-CM

## 2013-10-27 MED ORDER — GADOBENATE DIMEGLUMINE 529 MG/ML IV SOLN
15.0000 mL | Freq: Once | INTRAVENOUS | Status: AC | PRN
  Administered 2013-10-27: 15 mL via INTRAVENOUS

## 2013-10-28 ENCOUNTER — Ambulatory Visit: Payer: Medicare PPO | Admitting: Cardiovascular Disease

## 2013-10-29 ENCOUNTER — Other Ambulatory Visit: Payer: Medicare PPO

## 2013-10-29 ENCOUNTER — Ambulatory Visit (HOSPITAL_BASED_OUTPATIENT_CLINIC_OR_DEPARTMENT_OTHER): Payer: Medicare PPO | Admitting: Oncology

## 2013-10-29 VITALS — BP 125/75 | HR 70 | Temp 98.7°F | Resp 18 | Ht 66.0 in | Wt 171.1 lb

## 2013-10-29 DIAGNOSIS — C50319 Malignant neoplasm of lower-inner quadrant of unspecified female breast: Secondary | ICD-10-CM

## 2013-10-29 DIAGNOSIS — M810 Age-related osteoporosis without current pathological fracture: Secondary | ICD-10-CM

## 2013-10-29 DIAGNOSIS — Z170 Estrogen receptor positive status [ER+]: Secondary | ICD-10-CM

## 2013-10-29 NOTE — Progress Notes (Signed)
[place]  [phone] [phone]     ID: Terri Ponce OB: 03/03/1929  MR#: [id number]  NIO#:[id number]  PCP: Terri Argyle, MD GYN:   SU: Rolm Bookbinder OTHER MD: Thea Silversmith  CHIEF COMPLAINT: Locally advanced left breast cancer CURRENT TREATMENT: Neoadjuvant anastrozole   BREAST CANCER HISTORY: From the original intake Notes 08/13/2013:   Terri Ponce had screening mammography December of 2012 showing a low density oval nodule at the 9:00 position which was not palpable and which by ultrasound appeared to be a cyst. 6 month reevaluation along the 11/29/2011 showed a small nodule to have entirely resolved. Return to screening mammography was recommended.  However in January of 2015 the patient noted a change in her left breast which he initially felt was due to refer her bra rubbing on her skin. She was set up for bilateral diagnostic mammography and left breast ultrasonography at the breast center 06/20/2013. There was an asymmetry in the left breast area of palpable concern in exam showed a firm raised red area of spanning 3 cm along the far medial left breast inframammary fold. Ultrasound confirmed an area of skin thickening in ill-defined change measuring 1.7 cm. This extended to the skin. It was felt to be possibly due to an infected sebaceous cyst, and the patient was given antibiotics for a week  On 07/04/2013 repeat ultrasound of the left breast showed an irregular shadowing mass at the 8:00 position measuring 1.6 cm. The left axilla showed no lymphadenopathy. Physical exam showed a firm mass in this area. Biopsy was recommended and the patient's Rivaroxaban was temporarily stopped. On 07/16/2013, biopsy of the left breast 8:00 area in question showed ([id number]) and invasive lobular carcinoma (E-cadherin negative), grade 1, estrogen receptor 100% positive, progesterone receptor 74% positive, with an MIB-1 of 16% and no HER-2  amplification, the signals ratio being 1.29 and the number per cell 2.25.  On 07/25/2013 the patient underwent bilateral breast MRI. This showed in the lower inner quadrant of the left breast in the inframammary fold a 3.2 cm irregular enhancing mass containing a biopsy clip. Some spicules of enhancement extended posteriorly to the liver of the pectoralis. The mass abuts and appears to involve overlying skin. In addition, a 9 mm separate area of nodular enhancement was noted anterior and medial to the biopsied area. There were 2 left internal mammary lymph nodes measuring 4 mm and 2 mm. There was also a morphologically abnormal 1 cm lymph node in the left axilla. Incidental finding in the liver noted multiple masses favored to represent cysts.  On 08/04/2013 the patient underwent biopsy of the suspicious left axillary lymph node and this showed ([id number]) no evidence of malignancy. This was felt to be "possibly not concordant". Repeat MRI in 6 months (assuming neoadjuvant treatment) was recommended.  The patient has met with Dr. Donne Ponce and Dr. Pablo Ponce. Dr. Donne Ponce have set the patient up for biopsy of the satellite lesion in the left breast. She feels the patient would benefit from neoadjuvant endocrine therapy. Dr. Pablo Ponce feels the patient will need radiation, even if she eventually ends up having a mastectomy.  The patient's subsequent history is as detailed below.  INTERVAL HISTORY: "Terri Ponce" returns today for followup of her breast cancer accompanied by her daughter Terri Ponce. She continues on anastrozole, with very good tolerance.  REVIEW OF SYSTEMS:  She has some hot flashes which are more like "just a warmth". Vaginal dryness is not a major problem. Just some  joint pains, which are not more frequent or intense than prior. Her biggest problem is "taking care of my husband", who has significant plantar fasciitis problems. A detailed review of systems today was otherwise stable   PAST  MEDICAL HISTORY: Past Medical History  Diagnosis Date  . A-fib   . Multiple thyroid nodules   . Blood transfusion without reported diagnosis   . Hypertension    possible history of TIAs  PAST SURGICAL HISTORY: Past Surgical History  Procedure Laterality Date  . Ovarian cyst surgery  1956  . Intraocular lens insertion      right  . Wrist fracture surgery  July 2010    Left/titanium  . Left breast biopsy  07/16/2013    FAMILY HISTORY Family History  Problem Relation Age of Onset  . Arrhythmia Mother     A-Fib  . Hyperlipidemia Mother   . Hypertension Mother   . Cancer Mother     breast cancer  . Cancer Sister    the patient's father died at the age of 59, from complications of asbestosis. The patient's mother died at the age of 43. She had been diagnosed with breast cancer a few years before. The patient had no brothers, one sister. His sister died at age 72 from cancer which was metastatic but the patient does not know the primary  GYNECOLOGIC HISTORY:  Menarche age 51, first live birth age 35, the patient is Terri Ponce. She went through menopause at around age 78 or 72. She did not take hormone replacement. She took birth control pills for some years without any clotting complications  SOCIAL HISTORY:  Terri Ponce has a Masters degree in public health, specifically nutrition. Her husband Terri Ponce is an Chief Financial Officer and he worked for [employer] division. Daughter Terri Ponce (goes by "Terri Ponce") is a Architect with the [employer].  Son lives in [place] worry works as an Associate Professor. The patient has 2 grandchildren. She is a [employer]    ADVANCED DIRECTIVES:  in place    HEALTH MAINTENANCE: History  Substance Use Topics  . Smoking status: Never Smoker   . Smokeless tobacco: Not on file  . Alcohol Use: No     Colonoscopy: Remote  PAP: remote  Bone density: 2014; "good"   Lipid panel:  Allergies  Allergen Reactions  . Sulfonamide Derivatives   .  Amoxicillin Other (See Comments)    abdominal pain    Current Outpatient Prescriptions  Medication Sig Dispense Refill  . anastrozole (ARIMIDEX) 1 MG tablet Take 1 tablet (1 mg total) by mouth daily.  90 tablet  4  . apixaban (ELIQUIS) 5 MG TABS tablet Take 1 tablet (5 mg total) by mouth 2 (two) times daily.  60 tablet  6  . calcium & magnesium carbonates (MYLANTA) 311-232 MG per tablet Take 1 tablet by mouth daily.      Marland Kitchen CRANBERRY PO Take by mouth. 8 oz cranberry juice per day      . diltiazem (CARDIZEM) 30 MG tablet Take 30 mg by mouth daily.      . Multiple Vitamin (MULTIVITAMIN) capsule Take 1 capsule by mouth daily.      Marland Kitchen OVER THE COUNTER MEDICATION        No current facility-administered medications for this visit.    OBJECTIVE: Elderly white woman in no acute distress  Filed Vitals:   10/29/13 1408  BP: 125/75  Pulse: 70  Temp: 98.7 F (37.1 C)  Resp: 18  Body mass index is 27.63 kg/(m^2).    ECOG FS:1 - Symptomatic but completely ambulatory  Sclerae unicteric, EOMs intact Oropharynx clear and moist No cervical or supraclavicular adenopathy Lungs no rales or rhonchi Heart regular rate and rhythm Abd soft, nontender, positive bowel sounds MSK no focal spinal tenderness, no upper extremity lymphedema Neuro: nonfocal, well oriented, positive affect Breasts: The right breast is unremarkable. I do not palpate any clear mass in the left breast, except in the mid inframammary fold area, where there is approximately a 1 inch stretch of mild erythema and subjacent to that a little bit of induration which is hard to measure, perhaps 1 cm. The left axilla is benign.  LAB RESULTS:  CMP     Component Value Date/Time   NA 144 10/23/2013 1458    I No results found for this basename: SPEP,  UPEP,   kappa and lambda light chains    Lab Results  Component Value Date   WBC 9.2 10/23/2013      Chemistry      Component Value Date/Time   NA 144 10/23/2013 1458       Component Value Date/Time   CALCIUM 9.7 10/23/2013 1458       No results found for this basename: LABCA2    No components found with this basename: [id number]    No results found for this basename: INR,  in the last 168 hours  Urinalysis No results found for this basename: colorurine,  appearanceur,  labspec,  phurine,  glucoseu,  hgbur,  bilirubinur,  ketonesur,  proteinur,  urobilinogen,  nitrite,  leukocytesur    STUDIES: Mr Breast Bilateral W Wo Contrast  10/27/2013   CLINICAL DATA:  Patient with history of left breast invasive mammary carcinoma. Additionally there is a focus of adjacent atypical lobular hyperplasia. Patient is being treated with neoadjuvant chemotherapy.  EXAM: BILATERAL BREAST MRI WITH AND WITHOUT CONTRAST  TECHNIQUE: Multiplanar, multisequence MR images of both breasts were obtained prior to and following the intravenous administration of 58m of MultiHance.  THREE-DIMENSIONAL MR IMAGE RENDERING ON INDEPENDENT WORKSTATION:  Three-dimensional MR images were rendered by post-processing of the original MR data on an independent workstation. The three-dimensional MR images were interpreted, and findings are reported in the following complete MRI report for this study. Three dimensional images were evaluated at the independent DynaCad workstation  COMPARISON:  Prior breast MRI 07/25/2013.  FINDINGS: Breast composition: a.  Almost entirely fat.  Background parenchymal enhancement: Mild  Right breast: No mass or abnormal enhancement.  Left breast: Within the far lower inner quadrant of left breast near the inframammary fold is a 2.4 x 0.9 x 1.9 cm, previously 3.2 x 1.6 x 2.2 cm, irregular enhancing mass containing a central biopsy marking clip compatible with recently biopsy proven right breast carcinoma. This mass has decreased in size when compared to recent breast MRI and also demonstrates decreased enhancement. There are persistent spicules of enhancement extending posteriorly  to the level of the pectoralis muscle. Additionally this mass demonstrates abutment with the overlying skin.  Biopsy marking clip is demonstrated within the central lower left breast compatible with biopsy-proven atypical lobular hyperplasia. There is minimal surrounding enhancement as well as minimal surrounding intrinsic T1 bright material, suggestive of hematoma.  Lymph nodes: No abnormal appearing axillary lymph nodes. Unchanged 4 mm left internal mammary chain lymph node.  Ancillary findings: Re- demonstrated T2 bright masses within the liver, most compatible with cysts.  IMPRESSION: Interval decrease in size and enhancement of spiculated  mass within the lower inner quadrant of the left breast compatible with invasive carcinoma.  Biopsy marking clip with minimal surrounding enhancement at the site of biopsy-proven atypical lobular hyperplasia.  RECOMMENDATION: Treatment plan for left breast malignancy and ALH.  BI-RADS CATEGORY  6: Known biopsy-proven malignancy.   Electronically Signed   By: Lovey Newcomer M.D.   On: 10/27/2013 16:24  thral diverticulum versus cystocele.   Electronically Signed   By: Abigail Miyamoto M.D.   On: 08/26/2013 12:34    ASSESSMENT: 80 y.o. [place] woman status post left breast biopsy 07/16/2013 for a clinically multifocal T2 N0-3 (stage IIA-IIIC) invasive lobular breast cancer, estrogen receptor 100% positive, progesterone receptor 74% positive, with an MIB-1 of 16% and no HER-2 amplification  (1) biopsy of a suspicious left axillary lymph node 08/04/2013 was negative, but possibly discordant  (2) the patient has 2 suspicious left internal mammary lymph nodes as well as some indeterminate liver lesions which were negative on PET scan 08/26/2013 and by repeat breast MRI are c/w cysts  (3) MRI biopsy of a satellite lesion in the left breast 08/15/2013 showed atypical lobular hyperplasia.  (4) anastrozole started 08/13/2048  (5) DEXA scan 03/05/2013 at [facility] showed  osteoporosis with the lowest T score at -2.7  PLAN: Curtisha Bendix is having a good response to the anastrozole. Overall the mass in the left breast is shrunken approximately 1/3. We're going to continue this another 3 months, and then she will have a repeat breast MRI the first week in September. If she can see Dr. Donne Ponce shortly after that, very likely she can proceed to surgery.  Accordingly she will see me mid October. She can continue to take the anastrozole right through the surgery and the plan of course is to continue the drug for 5 years. I have encouraged her to exercise a little bit more. She is going to be discussing this with her cardiologist later this week.  The patient has a good understanding of the overall plan. She agrees with it. She knows the goal of treatment in her case is cure. She will call with any problems that may develop before her next visit here.    Chauncey Cruel, MD   10/29/2013 2:32 PM

## 2013-10-31 ENCOUNTER — Other Ambulatory Visit: Payer: Medicare PPO

## 2013-10-31 ENCOUNTER — Ambulatory Visit (INDEPENDENT_AMBULATORY_CARE_PROVIDER_SITE_OTHER): Payer: Medicare PPO | Admitting: Cardiovascular Disease

## 2013-10-31 ENCOUNTER — Encounter: Payer: Self-pay | Admitting: Cardiovascular Disease

## 2013-10-31 VITALS — BP 120/68 | HR 70 | Ht 66.0 in | Wt 169.5 lb

## 2013-10-31 DIAGNOSIS — I4891 Unspecified atrial fibrillation: Secondary | ICD-10-CM

## 2013-10-31 DIAGNOSIS — I480 Paroxysmal atrial fibrillation: Secondary | ICD-10-CM

## 2013-10-31 MED ORDER — DILTIAZEM HCL 30 MG PO TABS: 30.0000 mg | ORAL_TABLET | Freq: Every day | ORAL | Status: DC

## 2013-10-31 MED ORDER — APIXABAN 5 MG PO TABS: 5.0000 mg | ORAL_TABLET | Freq: Two times a day (BID) | ORAL | Status: DC

## 2013-10-31 NOTE — Patient Instructions (Signed)
Follow up with Dr. Acie Fredrickson in 6 months.

## 2013-10-31 NOTE — Progress Notes (Signed)
Terri Ponce Date of Birth  [birth year]       [facility]    [employer] [address], [address], [place], [place]  [address]   [place], [place]  [address] [phone]     [phone]   Fax  [phone]    Fax [phone]  Problem List: 1. Paroxysmal atrial fibrillation 2. Thyroid nodules 3. Hx of Hypertension  History of Present Illness:  Terri Ponce is an 80 year old female (mother of Dorsie Sethi)  who is seen today for further evaluation of paroxysmal atrial fibrillation.  She has had smptoms for years - palps off and on for years.   The palpitations are sporatic.  She thinks they may be related to stress.    She has a sensation in her left ribs.  She is able to push through the palpitations and denies any CP or dyspnea associated with the arrhythmia.  These palpitatioins   Recently she had some dyspnea at the beach and last week when she went to see Dr. Felipa Eth.    She seems to have some slight dizziness and unsteadiness recently.    She has had HTN in the past - resolved with a better diet and exercise and weight loss program.  BP now is ok.   She goes to the silver sneakers program  And measures her BP several times a week. She has been having night sweats for the past several years.      Dec. 3, 2014:  Terri Ponce is seen today with her daughter Terri Ponce).  She has tapered her Diltiazem 30 mg a day because of dizziness and nausea.   We also tried slow release Cardizem but she had dizziness and nausea with that as well.     October 31, 2013:  Terri Ponce is doing well.  Has a few palpitations - no prolonged episodes of Afib    Current Outpatient Prescriptions on File Prior to Visit  Medication Sig Dispense Refill  . anastrozole (ARIMIDEX) 1 MG tablet Take 1 tablet (1 mg total) by mouth daily.  90 tablet  4  . apixaban (ELIQUIS) 5 MG TABS tablet Take 1 tablet (5 mg total) by mouth 2 (two) times daily.  60 tablet  6  . calcium &  magnesium carbonates (MYLANTA) 311-232 MG per tablet Take 1 tablet by mouth daily.      Marland Kitchen CRANBERRY PO Take by mouth. 8 oz cranberry juice per day      . diltiazem (CARDIZEM) 30 MG tablet Take 30 mg by mouth daily.      . Multiple Vitamin (MULTIVITAMIN) capsule Take 1 capsule by mouth daily.      Marland Kitchen OVER THE COUNTER MEDICATION        No current facility-administered medications on file prior to visit.    Allergies  Allergen Reactions  . Sulfonamide Derivatives   . Amoxicillin Other (See Comments)    abdominal pain    Past Medical History  Diagnosis Date  . A-fib   . Multiple thyroid nodules   . Blood transfusion without reported diagnosis   . Hypertension   . Breast cancer     Left breast     Past Surgical History  Procedure Laterality Date  . Ovarian cyst surgery  1956  . Intraocular lens insertion      right  . Wrist fracture surgery  July 2010    Left/titanium  . Left breast biopsy  07/16/2013  History  Smoking status  . Never Smoker   Smokeless tobacco  . Not on file    History  Alcohol Use No    Family History  Problem Relation Age of Onset  . Arrhythmia Mother     A-Fib  . Hyperlipidemia Mother   . Hypertension Mother   . Cancer Mother     breast cancer  . Cancer Sister     Reviw of Systems:  Reviewed in the HPI.  All other systems are negative.  Physical Exam: Height 5\' 6"  (1.676 m), weight 169 lb 8 oz (76.885 kg). General: Well developed, well nourished, in no acute distress.  Head: Normocephalic, atraumatic, sclera non-icteric, mucus membranes are moist,   Neck: Supple. Carotids are 2 + without bruits. No JVD   Lungs: Clear   Heart: RR, normal S1, S2, no murmurs  Abdomen: Soft, non-tender, non-distended with normal bowel sounds.  Msk:  Strength and tone are normal   Extremities: No clubbing or cyanosis. No edema.  Distal pedal pulses are 2+ and equal    Neuro: CN II - XII intact.  Alert and oriented X 3.   Psych:  Normal    ECG: October 31, 2013: :  NSR at 70. No St / T wave abn.   Assessment / Plan:

## 2013-10-31 NOTE — Assessment & Plan Note (Signed)
Terri Ponce is doing very well. She still in normal sinus rhythm. She's not had any symptoms. She does have some generalized fatigue but is only on low-dose diltiazem.  She seems to be doing quite well. She will probably need to have breast surgery in September. I think it if she stays is stable and she is, she'll be at low risk for upcoming breast surgery. It'll be okay to hold her Eliquis for 2 days prior to surgery.  I'll see her again in 6 months.

## 2013-11-01 ENCOUNTER — Telehealth: Payer: Self-pay | Admitting: Oncology

## 2013-11-01 NOTE — Telephone Encounter (Signed)
, °

## 2014-01-06 ENCOUNTER — Telehealth: Payer: Self-pay | Admitting: *Deleted

## 2014-01-06 NOTE — Telephone Encounter (Signed)
Received call from patient stating she had questions concerning her schedule.  Confirmed her appointments with Dr. Jana Hakim and gave her phone number to GI to get her MRI breast scheduled for the 1st week in September.  Informed her I would contact Dr. Cristal Generous office about an appointment with him after her MRI and that they would call her with an appointment.  Patient verbalized understanding.

## 2014-01-07 ENCOUNTER — Other Ambulatory Visit: Payer: Self-pay | Admitting: Oncology

## 2014-01-07 DIAGNOSIS — Z853 Personal history of malignant neoplasm of breast: Secondary | ICD-10-CM

## 2014-01-12 ENCOUNTER — Other Ambulatory Visit: Payer: Self-pay

## 2014-01-12 ENCOUNTER — Other Ambulatory Visit: Payer: Self-pay | Admitting: Oncology

## 2014-01-12 DIAGNOSIS — Z853 Personal history of malignant neoplasm of breast: Secondary | ICD-10-CM

## 2014-01-15 ENCOUNTER — Encounter (INDEPENDENT_AMBULATORY_CARE_PROVIDER_SITE_OTHER): Payer: Self-pay

## 2014-01-15 ENCOUNTER — Ambulatory Visit
Admission: RE | Admit: 2014-01-15 | Discharge: 2014-01-15 | Disposition: A | Discharge status: Home / Self Care | Payer: Medicare HMO | Source: Ambulatory Visit | Attending: Oncology | Admitting: Oncology

## 2014-01-15 DIAGNOSIS — Z853 Personal history of malignant neoplasm of breast: Secondary | ICD-10-CM

## 2014-01-20 ENCOUNTER — Ambulatory Visit
Admission: RE | Admit: 2014-01-20 | Discharge: 2014-01-20 | Disposition: A | Discharge status: Home / Self Care | Payer: Medicare HMO | Source: Ambulatory Visit | Attending: Oncology | Admitting: Oncology

## 2014-01-20 DIAGNOSIS — C50319 Malignant neoplasm of lower-inner quadrant of unspecified female breast: Secondary | ICD-10-CM

## 2014-01-20 MED ORDER — GADOBENATE DIMEGLUMINE 529 MG/ML IV SOLN
19.0000 mL | Freq: Once | INTRAVENOUS | Status: AC | PRN
  Administered 2014-01-20: 19 mL via INTRAVENOUS

## 2014-01-22 ENCOUNTER — Encounter (INDEPENDENT_AMBULATORY_CARE_PROVIDER_SITE_OTHER): Payer: Medicare PPO | Admitting: General Surgery

## 2014-02-03 ENCOUNTER — Telehealth (INDEPENDENT_AMBULATORY_CARE_PROVIDER_SITE_OTHER): Payer: Self-pay

## 2014-02-03 NOTE — Telephone Encounter (Signed)
Called pt to advise her that Dr Donne Hazel is aware about the pt' s last message to our office that she can't have breast cancer sx now due to her husband having a major stroke on Labor Day and he is in ICU. The pt said she would call us back sometime when she knows more about her husbands situation and what she will be dealing with long term for his care before she can even think about doing surgery. I did send a message to [facility] at [facility] to notify Dr Jana Hakim with Tivis Ringer his RN. The pt is supposed to have a f/u appt with Dr Jana Hakim in October. I will wait to hear back from the pt.

## 2014-03-03 ENCOUNTER — Other Ambulatory Visit: Payer: Self-pay | Admitting: *Deleted

## 2014-03-03 ENCOUNTER — Telehealth: Payer: Self-pay

## 2014-03-03 DIAGNOSIS — C50319 Malignant neoplasm of lower-inner quadrant of unspecified female breast: Secondary | ICD-10-CM

## 2014-03-03 NOTE — Telephone Encounter (Signed)
Pt called and is experiencing numbness and tingling in her feet, radiates from her toes to her hip. Worse in the mornings. also SOB. Please call.

## 2014-03-04 ENCOUNTER — Ambulatory Visit (HOSPITAL_COMMUNITY)
Admission: RE | Admit: 2014-03-04 | Discharge: 2014-03-04 | Disposition: A | Discharge status: Home / Self Care | Payer: Medicare HMO | Source: Ambulatory Visit | Attending: Oncology | Admitting: Oncology

## 2014-03-04 ENCOUNTER — Ambulatory Visit (HOSPITAL_BASED_OUTPATIENT_CLINIC_OR_DEPARTMENT_OTHER): Payer: Medicare HMO | Admitting: Oncology

## 2014-03-04 ENCOUNTER — Other Ambulatory Visit (HOSPITAL_BASED_OUTPATIENT_CLINIC_OR_DEPARTMENT_OTHER): Payer: Medicare HMO

## 2014-03-04 ENCOUNTER — Telehealth: Payer: Self-pay | Admitting: Oncology

## 2014-03-04 VITALS — BP 141/65 | HR 74 | Temp 98.5°F | Resp 18 | Ht 66.0 in | Wt 159.9 lb

## 2014-03-04 DIAGNOSIS — R0602 Shortness of breath: Secondary | ICD-10-CM | POA: Diagnosis not present

## 2014-03-04 DIAGNOSIS — I4891 Unspecified atrial fibrillation: Secondary | ICD-10-CM | POA: Diagnosis not present

## 2014-03-04 DIAGNOSIS — C50319 Malignant neoplasm of lower-inner quadrant of unspecified female breast: Secondary | ICD-10-CM

## 2014-03-04 DIAGNOSIS — C50912 Malignant neoplasm of unspecified site of left female breast: Secondary | ICD-10-CM

## 2014-03-04 DIAGNOSIS — C50312 Malignant neoplasm of lower-inner quadrant of left female breast: Secondary | ICD-10-CM

## 2014-03-04 DIAGNOSIS — Z170 Estrogen receptor positive status [ER+]: Secondary | ICD-10-CM

## 2014-03-04 DIAGNOSIS — M810 Age-related osteoporosis without current pathological fracture: Secondary | ICD-10-CM

## 2014-03-04 LAB — COMPREHENSIVE METABOLIC PANEL (CC13)
ALK PHOS: 83 U/L (ref 40–150)
ALT: 18 U/L (ref 0–55)
AST: 18 U/L (ref 5–34)
Albumin: 3.8 g/dL (ref 3.5–5.0)
Anion Gap: 9 mEq/L (ref 3–11)
BILIRUBIN TOTAL: 0.35 mg/dL (ref 0.20–1.20)
BUN: 17.3 mg/dL (ref 7.0–26.0)
CO2: 27 mEq/L (ref 22–29)
Calcium: 9.7 mg/dL (ref 8.4–10.4)
Chloride: 107 mEq/L (ref 98–109)
Creatinine: 0.9 mg/dL (ref 0.6–1.1)
Glucose: 120 mg/dl (ref 70–140)
Potassium: 4.1 mEq/L (ref 3.5–5.1)
Sodium: 144 mEq/L (ref 136–145)
Total Protein: 6.5 g/dL (ref 6.4–8.3)

## 2014-03-04 LAB — CBC WITH DIFFERENTIAL/PLATELET
BASO%: 0.9 % (ref 0.0–2.0)
Basophils Absolute: 0.1 10*3/uL (ref 0.0–0.1)
EOS%: 1.6 % (ref 0.0–7.0)
Eosinophils Absolute: 0.2 10*3/uL (ref 0.0–0.5)
HEMATOCRIT: 39.9 % (ref 34.8–46.6)
HEMOGLOBIN: 12.8 g/dL (ref 11.6–15.9)
LYMPH%: 26.1 % (ref 14.0–49.7)
MCH: 28.7 pg (ref 25.1–34.0)
MCHC: 32.1 g/dL (ref 31.5–36.0)
MCV: 89.4 fL (ref 79.5–101.0)
MONO#: 0.5 10*3/uL (ref 0.1–0.9)
MONO%: 5.2 % (ref 0.0–14.0)
NEUT#: 6.2 10*3/uL (ref 1.5–6.5)
NEUT%: 66.2 % (ref 38.4–76.8)
Platelets: 272 10*3/uL (ref 145–400)
RBC: 4.47 10*6/uL (ref 3.70–5.45)
RDW: 13.6 % (ref 11.2–14.5)
WBC: 9.4 10*3/uL (ref 3.9–10.3)
lymph#: 2.4 10*3/uL (ref 0.9–3.3)

## 2014-03-04 NOTE — Telephone Encounter (Signed)
per pof to sch pt appt-gave pt copy of sch °

## 2014-03-04 NOTE — Telephone Encounter (Signed)
Patient called and stated she is having numbness and tingling in her feet in the mornings  This has been going on for a month  She is also having some shortness of breath at times She wants to see someone right away to make sure she is not having circulation issues   She has appt with her PCP in 1 hour  I advised her discuss this with her PCP today  Appt made with Christell Faith for 10/19 Advised her to cancel appt if PCP addresses issue   Advised patient to contact EMS if her situation becomes emergent

## 2014-03-04 NOTE — Progress Notes (Signed)
[place]  [phone] [phone]     ID: [name] OB: 15-Oct-1996  MR#: [id number]  HAL#:[id number]  PCP: Mathews Argyle, MD GYN:   SU: Rolm Bookbinder OTHER MD: Thea Silversmith  CHIEF COMPLAINT: Locally advanced left breast cancer CURRENT TREATMENT: Neoadjuvant anastrozole   BREAST CANCER HISTORY: From the original intake Notes 08/13/2013:   Ms. Robeck had screening mammography December of 2012 showing a low density oval nodule at the 9:00 position which was not palpable and which by ultrasound appeared to be a cyst. 6 month reevaluation along the 11/29/2011 showed a small nodule to have entirely resolved. Return to screening mammography was recommended.  However in January of 2015 the patient noted a change in her left breast which he initially felt was due to refer her bra rubbing on her skin. She was set up for bilateral diagnostic mammography and left breast ultrasonography at the breast center 06/20/2013. There was an asymmetry in the left breast area of palpable concern in exam showed a firm raised red area of spanning 3 cm along the far medial left breast inframammary fold. Ultrasound confirmed an area of skin thickening in ill-defined change measuring 1.7 cm. This extended to the skin. It was felt to be possibly due to an infected sebaceous cyst, and the patient was given antibiotics for a week  On 07/04/2013 repeat ultrasound of the left breast showed an irregular shadowing mass at the 8:00 position measuring 1.6 cm. The left axilla showed no lymphadenopathy. Physical exam showed a firm mass in this area. Biopsy was recommended and the patient's Rivaroxaban was temporarily stopped. On 07/16/2013, biopsy of the left breast 8:00 area in question showed ([id number]) and invasive lobular carcinoma (E-cadherin negative), grade 1, estrogen receptor 100% positive, progesterone receptor 74% positive, with an MIB-1 of 16% and no HER-2  amplification, the signals ratio being 1.29 and the number per cell 2.25.  On 07/25/2013 the patient underwent bilateral breast MRI. This showed in the lower inner quadrant of the left breast in the inframammary fold a 3.2 cm irregular enhancing mass containing a biopsy clip. Some spicules of enhancement extended posteriorly to the liver of the pectoralis. The mass abuts and appears to involve overlying skin. In addition, a 9 mm separate area of nodular enhancement was noted anterior and medial to the biopsied area. There were 2 left internal mammary lymph nodes measuring 4 mm and 2 mm. There was also a morphologically abnormal 1 cm lymph node in the left axilla. Incidental finding in the liver noted multiple masses favored to represent cysts.  On 08/04/2013 the patient underwent biopsy of the suspicious left axillary lymph node and this showed ([id number]) no evidence of malignancy. This was felt to be "possibly not concordant". Repeat MRI in 6 months (assuming neoadjuvant treatment) was recommended.  The patient has met with Dr. Donne Hazel and Dr. Pablo Ledger. Dr. Donne Hazel have set the patient up for biopsy of the satellite lesion in the left breast. She feels the patient would benefit from neoadjuvant endocrine therapy. Dr. Pablo Ledger feels the patient will need radiation, even if she eventually ends up having a mastectomy.  The patient's subsequent history is as detailed below.  INTERVAL HISTORY: "Tonya Wantz" returns today for followup of her breast cancer accompanied by her daughter [name]. The patient continues on anastrozole, with excellent tolerance. However since her last visit here the her husband had a stroke. This took him to the intensive care unit" he struggles for 15 days". She  is blaming herself because they did not put him under hospice care  Earlier. She is very clear in her own mind now that under similar circumstances she would not want to be put through all the interventions her husband  underwent. Her daughter is her healthcare power of attorney and they seem to have a good understanding of what is appropriate in the [facility] view at this point  REVIEW OF SYSTEMS:  She has some hot flashes which are more like "just a warmth". Vaginal dryness is not a major problem. Just some joint pains, which are not more frequent or intense than prior. Her biggest problem is "taking care of my husband", who has significant plantar fasciitis problems. A detailed review of systems today was otherwise stable   PAST MEDICAL HISTORY: Past Medical History  Diagnosis Date  . A-fib   . Multiple thyroid nodules   . Blood transfusion without reported diagnosis   . Hypertension   . Breast cancer     Left breast    possible history of TIAs  PAST SURGICAL HISTORY: Past Surgical History  Procedure Laterality Date  . Ovarian cyst surgery  1956  . Intraocular lens insertion      right  . Wrist fracture surgery  July 2010    Left/titanium  . Left breast biopsy  07/16/2013    FAMILY HISTORY Family History  Problem Relation Age of Onset  . Arrhythmia Mother     A-Fib  . Hyperlipidemia Mother   . Hypertension Mother   . Cancer Mother     breast cancer  . Cancer Sister    the patient's father died at the age of 77, from complications of asbestosis. The patient's mother died at the age of 66. She had been diagnosed with breast cancer a few years before. The patient had no brothers, one sister. His sister died at age 8 from cancer which was metastatic but the patient does not know the primary  GYNECOLOGIC HISTORY:  Menarche age 45, first live birth age 79, the patient is GX P2. She went through menopause at around age 51 or 71. She did not take hormone replacement. She took birth control pills for some years without any clotting complications  SOCIAL HISTORY:  Melaya Hoselton has a Masters degree in public health, specifically nutrition. Her husband Weston Brass was an Art gallery manager and he worked for [employer]  division. Daughter Bradleigh Sonnen (goes by "Byrd Hesselbach") is a Gaffer with the [employer].  Son lives in [place] worry works as an Contractor. The patient has 2 grandchildren. She is a [employer]    ADVANCED DIRECTIVES:  in place; the patient's daughter is her healthcare power of attorney   HEALTH MAINTENANCE: History  Substance Use Topics  . Smoking status: Never Smoker   . Smokeless tobacco: Not on file  . Alcohol Use: No     Colonoscopy: Remote  PAP: remote  Bone density: 2014; "good"   Lipid panel:  Allergies  Allergen Reactions  . Sulfonamide Derivatives   . Amoxicillin Other (See Comments)    abdominal pain    Current Outpatient Prescriptions  Medication Sig Dispense Refill  . anastrozole (ARIMIDEX) 1 MG tablet Take 1 tablet (1 mg total) by mouth daily.  90 tablet  4  . apixaban (ELIQUIS) 5 MG TABS tablet Take 1 tablet (5 mg total) by mouth 2 (two) times daily.  180 tablet  3  . calcium & magnesium carbonates (MYLANTA) 758-262 MG per tablet Take 1  tablet by mouth daily.      Marland Kitchen CRANBERRY PO Take by mouth. 8 oz cranberry juice per day      . diltiazem (CARDIZEM) 30 MG tablet Take 1 tablet (30 mg total) by mouth daily.  90 tablet  3  . Multiple Vitamin (MULTIVITAMIN) capsule Take 1 capsule by mouth daily.      Marland Kitchen OVER THE COUNTER MEDICATION        No current facility-administered medications for this visit.    OBJECTIVE: Elderly white woman who was appropriately tearful during today's visit Filed Vitals:   03/04/14 1422  BP: 141/65  Pulse: 74  Temp: 98.5 F (36.9 C)  Resp: 18     Body mass index is 25.82 kg/(m^2).    ECOG FS:1 - Symptomatic but completely ambulatory  Sclerae unicteric, pupils round and equal Oropharynx clear, teeth in good repair No cervical or supraclavicular adenopathy Lungs no rales or rhonchi Heart regular rate and rhythm Abd soft, nontender, positive bowel sounds MSK no focal spinal tenderness, no upper extremity  lymphedema Neuro: nonfocal, well oriented, appropriate affect Breasts: The right breast is unremarkable. In the left breast I do not palpate any mass and I do not see any skin or nipple change of concern.. The left axilla is benign.  LAB RESULTS:  CMP     Component Value Date/Time   NA 144 10/23/2013 1458    I No results found for this basename: SPEP,  UPEP,   kappa and lambda light chains    Lab Results  Component Value Date   WBC 9.4 03/04/2014      Chemistry      Component Value Date/Time   NA 144 10/23/2013 1458      Component Value Date/Time   CALCIUM 9.7 10/23/2013 1458       No results found for this basename: LABCA2    No components found with this basename: LABCA125    No results found for this basename: INR,  in the last 168 hours  Urinalysis No results found for this basename: colorurine,  appearanceur,  labspec,  phurine,  glucoseu,  hgbur,  bilirubinur,  ketonesur,  proteinur,  urobilinogen,  nitrite,  leukocytesur    STUDIES: ADDENDUM REPORT: 01/21/2014 10:07  ADDENDUM:  Patient had labs prior to MRI. BUN: 19 creatinine: 0.8. GFR: 69  Electronically Signed  By: Lajean Manes M.D.  On: 01/21/2014 10:07       Study Result    CLINICAL DATA: Patient treated for left breast invasive mammary  carcinoma and atypical lobular hyperplasia. Follow-up from prior  breast MRI.  LABS: None  EXAM:  BILATERAL BREAST MRI WITH AND WITHOUT CONTRAST  TECHNIQUE:  Multiplanar, multisequence MR images of both breasts were obtained  prior to and following the intravenous administration of 83m of  MultiHance.  THREE-DIMENSIONAL MR IMAGE RENDERING ON INDEPENDENT WORKSTATION:  Three-dimensional MR images were rendered by post-processing of the  original MR data on an independent workstation. The  three-dimensional MR images were interpreted, and findings are  reported in the following complete MRI report for this study. Three  dimensional images were evaluated at  the independent DynaCad  workstation  COMPARISON: Prior mammograms, the most recent dated 01/15/2014.  Previous breast MRI, 10/27/2013.  FINDINGS:  Breast composition: a. Almost entirely fat.  Background parenchymal enhancement: Minimal  Right breast: No mass or abnormal enhancement.  Left breast: Clip artifact from the inferior left breast, stable.  Previously seen enhancing, 2.4 cm x 0.9 cm x 1.9 cm  mass containing  a central biopsy clip, is no longer present. Other inferior left  breast enhancement seen on the prior MRI, extending to the  inferomedial subcutaneous breast margin, has nearly resolved. There  is only mild, residual, hazy non masslike enhancement. There are no  new areas of abnormal enhancement.  Lymph nodes: No abnormal appearing lymph nodes.  Ancillary findings: None.  IMPRESSION:  Interval continued improvement consistent with a positive response  to therapy. Previously seen enhancing, spiculated mass in the lower  inner left breast is no longer evident. There is now only mild,  residual hazy non masslike enhancement. No new abnormalities.  RECOMMENDATION:  Continue with current treatment plan.  BI-RADS CATEGORY 6: Known biopsy-proven malignancy.  Electronically Signed:  By: Lajean Manes M.D.  On: 01/21/2014 09:41       ASSESSMENT: 80 y.o. [place] woman status post left breast biopsy 07/16/2013 for a clinically multifocal T2 N0-3 (stage IIA-IIIC) invasive lobular breast cancer, estrogen receptor 100% positive, progesterone receptor 74% positive, with an MIB-1 of 16% and no HER-2 amplification  (1) biopsy of a suspicious left axillary lymph node 08/04/2013 was negative, but possibly discordant  (2) the patient has 2 suspicious left internal mammary lymph nodes as well as some indeterminate liver lesions which were negative on PET scan 08/26/2013 and by repeat breast MRI are c/w cysts  (3) MRI biopsy of a satellite lesion in the left breast 08/15/2013 showed  atypical lobular hyperplasia.  (4) anastrozole started 08/13/2048  (5) DEXA scan 03/05/2013 at [facility] showed osteoporosis with the lowest T score at -2.7  PLAN: Lorie Cleckley is doing terrific on the anastrozole. The mass has essentially disappeared radiologically. She does still need surgery and she might as well try to get that and the radiation accomplished before the end of the year. She will be contacting Dr. Cristal Generous office regarding an appointment. She understands we don't need to stop the anastrozole. She can simply continue that right on through the other treatments.  She is grieving very appropriately. Her husband's death was abrupt so there was not a lot of time to prepare. She is overwhelmed with all the paperwork and legal problems but luckily her daughter is a great help. I suggested that Theodis Sato participate in the grief counseling program from hospice and she is favorably inclined to that.  I am not sure I understand the reason for her legs feeling numb in the morning before she gets out of bed and then improving quickly with activity. She tells me that if she doesn't take Cardizem in the evening the symptoms don't occur. At least that's what happened one time. I suggested she do the experiment one more time and not take her Cardizem tonight. If that takes care of the problem then she needs to discuss it with her cardiologist only confined a different medication for her. In any case we're going to obtain a chest x-ray today to make sure there is no other obvious problem going on.  She will return to see me in 3 months. She knows to call for any problems that may develop before that visit.    Chauncey Cruel, MD   03/04/2014 2:41 PM

## 2014-03-09 ENCOUNTER — Ambulatory Visit: Payer: Self-pay

## 2014-03-09 ENCOUNTER — Encounter: Payer: Self-pay | Admitting: Physician Assistant

## 2014-03-09 ENCOUNTER — Ambulatory Visit (INDEPENDENT_AMBULATORY_CARE_PROVIDER_SITE_OTHER): Payer: Medicare HMO | Admitting: Physician Assistant

## 2014-03-09 VITALS — BP 132/68 | HR 71 | Ht 66.0 in | Wt 160.0 lb

## 2014-03-09 DIAGNOSIS — C50919 Malignant neoplasm of unspecified site of unspecified female breast: Secondary | ICD-10-CM

## 2014-03-09 DIAGNOSIS — R202 Paresthesia of skin: Secondary | ICD-10-CM

## 2014-03-09 DIAGNOSIS — M79605 Pain in left leg: Secondary | ICD-10-CM

## 2014-03-09 DIAGNOSIS — R252 Cramp and spasm: Secondary | ICD-10-CM

## 2014-03-09 DIAGNOSIS — I480 Paroxysmal atrial fibrillation: Secondary | ICD-10-CM

## 2014-03-09 DIAGNOSIS — M79604 Pain in right leg: Secondary | ICD-10-CM

## 2014-03-09 DIAGNOSIS — R0602 Shortness of breath: Secondary | ICD-10-CM

## 2014-03-09 LAB — COMPREHENSIVE METABOLIC PANEL
Albumin: 4.2 g/dL (ref 3.4–5.0)
Alkaline Phosphatase: 99 U/L
Anion Gap: 8 (ref 7–16)
BILIRUBIN TOTAL: 0.3 mg/dL (ref 0.2–1.0)
BUN: 20 mg/dL — AB (ref 7–18)
Calcium, Total: 8.8 mg/dL (ref 8.5–10.1)
Chloride: 104 mmol/L (ref 98–107)
Co2: 27 mmol/L (ref 21–32)
Creatinine: 0.89 mg/dL (ref 0.60–1.30)
EGFR (African American): >60
EGFR (Non-African Amer.): >60
Glucose: 87 mg/dL (ref 65–99)
OSMOLALITY: 280 (ref 275–301)
Potassium: 4.1 mmol/L (ref 3.5–5.1)
SGOT(AST): 30 U/L (ref 15–37)
SGPT (ALT): 30 U/L
Sodium: 139 mmol/L (ref 136–145)
Total Protein: 7.6 g/dL (ref 6.4–8.2)

## 2014-03-09 LAB — CBC WITH DIFFERENTIAL/PLATELET
BASOS ABS: 0.1 10*3/uL (ref 0.0–0.1)
Basophil %: 0.7 %
EOS ABS: 0.2 10*3/uL (ref 0.0–0.7)
Eosinophil %: 1.7 %
HCT: 43.7 % (ref 35.0–47.0)
HGB: 14.1 g/dL (ref 12.0–16.0)
Lymphocyte #: 3.7 10*3/uL — ABNORMAL HIGH (ref 1.0–3.6)
Lymphocyte %: 31.4 %
MCH: 29 pg (ref 26.0–34.0)
MCHC: 32.2 g/dL (ref 32.0–36.0)
MCV: 90 fL (ref 80–100)
Monocyte #: 0.6 x10 3/mm (ref 0.2–0.9)
Monocyte %: 5.3 %
Neutrophil #: 7.1 10*3/uL — ABNORMAL HIGH (ref 1.4–6.5)
Neutrophil %: 60.9 %
Platelet: 289 10*3/uL (ref 150–440)
RBC: 4.85 10*6/uL (ref 3.80–5.20)
RDW: 13.8 % (ref 11.5–14.5)
WBC: 11.7 10*3/uL — ABNORMAL HIGH (ref 3.6–11.0)

## 2014-03-09 LAB — T4, FREE: FREE THYROXINE: 0.94 ng/dL (ref 0.76–1.46)

## 2014-03-09 LAB — FOLATE: Folic Acid: 16.6 ng/mL (ref 3.1–100.0)

## 2014-03-09 LAB — TSH: Thyroid Stimulating Horm: 1.74 u[IU]/mL

## 2014-03-09 NOTE — Progress Notes (Signed)
Patient Name: Terri Ponce, Terri Ponce 08/30/1928, MRN [id number]  Date of Encounter: 03/09/2014  Primary Care Provider:  Mathews Argyle, MD Primary Cardiologist:  Dr. Acie Fred[place], MD  Patient Profile:  80 y.o. female with history below presents with 1 month history of bilateral lower extremity paresthesias only in the morning and SOB.    Problem List:   Past Medical History  Diagnosis Date  . A-fib   . Multiple thyroid nodules   . Blood transfusion without reported diagnosis   . Hypertension   . Breast cancer     Left breast    Past Surgical History  Procedure Laterality Date  . Ovarian cyst surgery  1956  . Intraocular lens insertion      right  . Wrist fracture surgery  July 2010    Left/titanium  . Left breast biopsy  07/16/2013     Allergies:  Allergies  Allergen Reactions  . Sulfonamide Derivatives   . Amoxicillin Other (See Comments)    abdominal pain     HPI:  80 y.o. female with the above problem list who presents to clinic today with a 1 month history of bilateral lower extremity paresthesias and SOB only in the morning.   Patient with known history of PAF (has had palpitations off and on for years). Currently on diltiazem 30 mg daily and apixaban 5 mg bid. She also has a history of thyroid nodules and HTN.   She comes in today stating for the past 2 months she has noted intermittent bilateral leg cramping in the early morning hours between 12 AM and 3 AM. Movement seems to improve these leg cramps. She denies any claudication. She did have follow up with her oncologist - she was doing well at that time. There was some question of diltiazem interacting with her anastrozole, which she previously took together. She now takes her diltiazem around noon and feels like her symptoms have improved but she cannot be for certain. Her symptoms come on between the hours of 12 AM and 3 AM, interestingly this is the same time frame that she takes her anastrozole. She  has not seen her PCP regarding this issue.     Home Medications:  Prior to Admission medications   Medication Sig Start Date End Date Taking? Authorizing Provider  anastrozole (ARIMIDEX) 1 MG tablet Take 1 tablet (1 mg total) by mouth daily. 08/13/13   Chauncey Cruel, MD  apixaban (ELIQUIS) 5 MG TABS tablet Take 1 tablet (5 mg total) by mouth 2 (two) times daily. 10/31/13   Thayer Headings, MD  calcium & magnesium carbonates (MYLANTA) [phone] MG per tablet Take 1 tablet by mouth daily.    Historical Provider, MD  CRANBERRY PO Take by mouth. 8 oz cranberry juice per day    Historical Provider, MD  diltiazem (CARDIZEM) 30 MG tablet Take 1 tablet (30 mg total) by mouth daily. 10/31/13   Thayer Headings, MD  Multiple Vitamin (MULTIVITAMIN) capsule Take 1 capsule by mouth daily.    Historical Provider, MD  [place]     Historical Provider, MD     Weights: Filed Weights   03/09/14 1414  Weight: 160 lb (72.576 kg)     Review of Systems:  All other systems reviewed and are otherwise negative except as noted above.  Physical Exam:  Blood pressure 132/68, pulse 71, height 5\' 6"  (1.676 m), weight 160 lb (72.576 kg).  General: Pleasant, NAD Psych: Normal affect. Neuro: Alert and  oriented X 3. Moves all extremities spontaneously. HEENT: Normal  Neck: Supple without bruits or JVD. Lungs:  Resp regular and unlabored, CTA. Heart: RRR no s3, s4, or murmurs. Abdomen: Soft, non-tender, non-distended, BS + x 4.  Extremities: No clubbing, cyanosis or edema. DP/PT/Radials 2+ and equal bilaterally.   Accessory Clinical Findings:  EKG - NSR, 71, left axis, no st/t changes  Assessment & Plan:  1. Bilateral lower extremity paresthesias/cramping : -Symptoms have been present for the past couple of months, are only present from the hours of 12 AM - 3 AM (this also happens to be the time frame when she takes her anastrozole).   -Check bilateral arterial duplex  -Check CBC,  CMET, B12, folate, MMA, TSH, free T4, total T3 -Encouraged her to rejoin her [employer] groups - she could gain quite a lot from this (physically, mentally, and spiritually). She endorses still being depressed from her husband's passing.    2. PAF: -Currently in NSR -Continue Eliquis 5 mg bid -Continue diltiazem 30 mg daily - patient does not want to make any medication changes at this time, secondly I am unaware of diltiazem causing leg cramping   3. HTN: -Well controlled -Continue current medications  4. Thyroid nodule: -Thyrois [place] 02/18/2013 indicated stable nodules - however if patient is high risk for thyroid carcinoma consider follow up US in 12 months - if hyperthyroid may need dedicated thyroid uptake scan  -Labs per above -Follow up with PCP   Christell Faith, PA-C [place], [place] [address] [phone] [facility] 03/09/2014, 3:48 PM

## 2014-03-09 NOTE — Patient Instructions (Signed)
Please go to the [place] entrance of [facility] for labs: CBC, CMET, B12, Folate, MMA, TSH, Free T4, Total T3,   We will schedule an ABI in [place]  Please follow up in 3 months w/ Dr. Acie Fredrickson

## 2014-03-16 ENCOUNTER — Ambulatory Visit (HOSPITAL_COMMUNITY): Payer: Medicare HMO | Attending: Cardiology | Admitting: Cardiology

## 2014-03-16 DIAGNOSIS — R200 Anesthesia of skin: Secondary | ICD-10-CM | POA: Diagnosis present

## 2014-03-16 DIAGNOSIS — I10 Essential (primary) hypertension: Secondary | ICD-10-CM | POA: Diagnosis not present

## 2014-03-16 DIAGNOSIS — M79605 Pain in left leg: Secondary | ICD-10-CM

## 2014-03-16 DIAGNOSIS — I4891 Unspecified atrial fibrillation: Secondary | ICD-10-CM | POA: Diagnosis not present

## 2014-03-16 DIAGNOSIS — R208 Other disturbances of skin sensation: Secondary | ICD-10-CM | POA: Diagnosis not present

## 2014-03-16 DIAGNOSIS — M79604 Pain in right leg: Secondary | ICD-10-CM

## 2014-03-16 DIAGNOSIS — R0602 Shortness of breath: Secondary | ICD-10-CM

## 2014-03-16 DIAGNOSIS — I7090 Unspecified atherosclerosis: Secondary | ICD-10-CM

## 2014-03-16 NOTE — Progress Notes (Signed)
LEA Doppler/ABI's performed

## 2014-04-01 ENCOUNTER — Encounter: Payer: Self-pay | Admitting: Cardiovascular Disease

## 2014-04-01 ENCOUNTER — Ambulatory Visit (INDEPENDENT_AMBULATORY_CARE_PROVIDER_SITE_OTHER): Payer: Medicare HMO | Admitting: Cardiovascular Disease

## 2014-04-01 VITALS — BP 124/72 | HR 66 | Ht 66.0 in | Wt 157.8 lb

## 2014-04-01 DIAGNOSIS — I480 Paroxysmal atrial fibrillation: Secondary | ICD-10-CM | POA: Insufficient documentation

## 2014-04-01 NOTE — Assessment & Plan Note (Addendum)
Terri Ponce is seen with her daughter, [name],  today. She's done well. She's not had any significant palpitations. She has had some problems with peripheral neuropathy. Her initial labs were normal. Her ankle-brachial indexes were normal. I've encouraged her to contact her medical doctor for further evaluation of her peripheral neuropathy.  She remains in sinus rhythm by exam. Continue same medications.  She is on Eliquis.    I'll see her again in 6 months for followup visit.

## 2014-04-01 NOTE — Progress Notes (Signed)
Terri Ponce Date of Birth  Sep 20, 1928       [facility]    [employer] [address], [address], [place], [place]  [address]   [place], [place]  [address] [phone]     [phone]   Fax  [phone]    Fax [phone]  Problem List: 1. Paroxysmal atrial fibrillation 2. Thyroid nodules 3. Hx of Hypertension  History of Present Illness:  Terri Ponce is an 80 year old female ([name])  who is seen today for further evaluation of paroxysmal atrial fibrillation.  She has had smptoms for years - palps off and on for years.   The palpitations are sporatic.  She thinks they may be related to stress.    She has a sensation in her left ribs.  She is able to push through the palpitations and denies any CP or dyspnea associated with the arrhythmia.  These palpitatioins   Recently she had some dyspnea at the beach and last week when she went to see Dr. Felipa Eth.    She seems to have some slight dizziness and unsteadiness recently.    She has had HTN in the past - resolved with a better diet and exercise and weight loss program.  BP now is ok.   She goes to the silver sneakers program  And measures her BP several times a week. She has been having night sweats for the past several years.      Dec. 3, 2014:  Terri Ponce is seen today with her daughter Terri Ponce).  She has tapered her Diltiazem 30 mg a day because of dizziness and nausea.   We also tried slow release Cardizem but she had dizziness and nausea with that as well.     October 31, 2013:  Terri Ponce is doing well.  Has a few palpitations - no prolonged episodes of Afib  04/01/2014:  Terri Ponce is seen  today for a followup of her paroxysmal atrial fibrillation. She also has a history of hypertension. She has been depressed since 01-23-2023 after her husband died. She has had lots of burning and tingling sensation in her legs - especially at night.  She was seen by Terri Ponce in  the [facility] office.  Arterial  ( ABI ) was normal. Labs were unremarkable.   She finds that her peripheral her neuropathy symptoms would typically resolve if she moves her legs around in a bicycle type pattern.   Current Outpatient Prescriptions on File Prior to Visit  Medication Sig Dispense Refill  . anastrozole (ARIMIDEX) 1 MG tablet Take 1 tablet (1 mg total) by mouth daily. 90 tablet 4  . apixaban (ELIQUIS) 5 MG TABS tablet Take 1 tablet (5 mg total) by mouth 2 (two) times daily. 180 tablet 3  . calcium & magnesium carbonates (MYLANTA) 311-232 MG per tablet Take 1 tablet by mouth daily.    Marland Kitchen CRANBERRY PO Take by mouth. 8 oz cranberry juice per day    . diltiazem (CARDIZEM) 30 MG tablet Take 1 tablet (30 mg total) by mouth daily. 90 tablet 3  . Multiple Vitamin (MULTIVITAMIN) capsule Take 1 capsule by mouth daily.    Marland Kitchen OVER THE COUNTER MEDICATION daily.      No current facility-administered medications on file prior to visit.    Allergies  Allergen Reactions  . Sulfonamide Derivatives   . Amoxicillin Other (See Comments)    abdominal pain    Past Medical  History  Diagnosis Date  . A-fib   . Multiple thyroid nodules   . Blood transfusion without reported diagnosis   . Hypertension   . Breast cancer     Left breast     Past Surgical History  Procedure Laterality Date  . Ovarian cyst surgery  1956  . Intraocular lens insertion      right  . Wrist fracture surgery  July 2010    Left/titanium  . Left breast biopsy  07/16/2013    History  Smoking status  . Never Smoker   Smokeless tobacco  . Not on file    History  Alcohol Use No    Family History  Problem Relation Age of Onset  . Arrhythmia Mother     A-Fib  . Hyperlipidemia Mother   . Hypertension Mother   . Cancer Mother     breast cancer  . Cancer Sister     Reviw of Systems:  Reviewed in the HPI.  All other systems are negative.  Physical Exam: Blood pressure 124/72, pulse 66, height 5\' 6"   (1.676 m), weight 157 lb 12.8 oz (71.578 kg). General: Well developed, well nourished, in no acute distress.  Head: Normocephalic, atraumatic, sclera non-icteric, mucus membranes are moist,   Neck: Supple. Carotids are 2 + without bruits. No JVD   Lungs: Clear   Heart: RR, normal S1, S2, no murmurs  Abdomen: Soft, non-tender, non-distended with normal bowel sounds.  Msk:  Strength and tone are normal   Extremities: No clubbing or cyanosis. No edema.  Distal pedal pulses are 2+ and equal    Neuro: CN II - XII intact.  Alert and oriented X 3.   Psych:  Normal   ECG: October 31, 2013: :  NSR at 70. No St / T wave abn.   Assessment / Plan:

## 2014-04-01 NOTE — Patient Instructions (Signed)
Your physician recommends that you continue on your current medications as directed. Please refer to the Current Medication list given to you today.  Your physician wants you to follow-up in: 6 months with Dr. Nahser.  You will receive a reminder letter in the mail two months in advance. If you don't receive a letter, please call our office to schedule the follow-up appointment.  

## 2014-04-03 DIAGNOSIS — E042 Nontoxic multinodular goiter: Secondary | ICD-10-CM | POA: Insufficient documentation

## 2014-04-03 DIAGNOSIS — I10 Essential (primary) hypertension: Secondary | ICD-10-CM | POA: Insufficient documentation

## 2014-04-03 DIAGNOSIS — Z5189 Encounter for other specified aftercare: Secondary | ICD-10-CM | POA: Insufficient documentation

## 2014-04-29 ENCOUNTER — Other Ambulatory Visit: Payer: Self-pay | Admitting: Geriatric Medicine

## 2014-04-29 DIAGNOSIS — E041 Nontoxic single thyroid nodule: Secondary | ICD-10-CM

## 2014-05-05 ENCOUNTER — Ambulatory Visit
Admission: RE | Admit: 2014-05-05 | Discharge: 2014-05-05 | Disposition: A | Discharge status: Home / Self Care | Payer: Self-pay | Source: Ambulatory Visit | Attending: Geriatric Medicine | Admitting: Geriatric Medicine

## 2014-05-05 DIAGNOSIS — E041 Nontoxic single thyroid nodule: Secondary | ICD-10-CM

## 2014-05-20 ENCOUNTER — Telehealth: Payer: Self-pay | Admitting: Oncology

## 2014-05-20 NOTE — Telephone Encounter (Signed)
S/w pt advised appt chg from 1/27 (md pal) to 3/22 lab @ 3.15 and 3/29 md @ 4pm. Pt verbalized understanding.

## 2014-05-28 ENCOUNTER — Ambulatory Visit: Payer: Medicare HMO | Admitting: Cardiovascular Disease

## 2014-05-29 ENCOUNTER — Emergency Department (HOSPITAL_COMMUNITY)
Admission: EM | Admit: 2014-05-29 | Discharge: 2014-05-29 | Disposition: A | Discharge status: Home / Self Care | Payer: Medicare HMO | Attending: Emergency Medicine | Admitting: Emergency Medicine

## 2014-05-29 ENCOUNTER — Encounter (HOSPITAL_COMMUNITY): Payer: Self-pay | Admitting: Emergency Medicine

## 2014-05-29 DIAGNOSIS — Z880 Allergy status to penicillin: Secondary | ICD-10-CM | POA: Diagnosis not present

## 2014-05-29 DIAGNOSIS — I4891 Unspecified atrial fibrillation: Secondary | ICD-10-CM | POA: Insufficient documentation

## 2014-05-29 DIAGNOSIS — I10 Essential (primary) hypertension: Secondary | ICD-10-CM | POA: Diagnosis not present

## 2014-05-29 DIAGNOSIS — R040 Epistaxis: Secondary | ICD-10-CM | POA: Diagnosis present

## 2014-05-29 DIAGNOSIS — Z853 Personal history of malignant neoplasm of breast: Secondary | ICD-10-CM | POA: Insufficient documentation

## 2014-05-29 DIAGNOSIS — Z79899 Other long term (current) drug therapy: Secondary | ICD-10-CM | POA: Insufficient documentation

## 2014-05-29 DIAGNOSIS — Z8639 Personal history of other endocrine, nutritional and metabolic disease: Secondary | ICD-10-CM | POA: Insufficient documentation

## 2014-05-29 DIAGNOSIS — Z7902 Long term (current) use of antithrombotics/antiplatelets: Secondary | ICD-10-CM | POA: Diagnosis not present

## 2014-05-29 LAB — CBC WITH DIFFERENTIAL/PLATELET
Basophils Absolute: 0 10*3/uL (ref 0.0–0.1)
Basophils Relative: 0 % (ref 0–1)
Eosinophils Absolute: 0.2 10*3/uL (ref 0.0–0.7)
Eosinophils Relative: 2 % (ref 0–5)
HCT: 40.4 % (ref 36.0–46.0)
Hemoglobin: 13.1 g/dL (ref 12.0–15.0)
LYMPHS ABS: 2.4 10*3/uL (ref 0.7–4.0)
LYMPHS PCT: 27 % (ref 12–46)
MCH: 29.4 pg (ref 26.0–34.0)
MCHC: 32.4 g/dL (ref 30.0–36.0)
MCV: 90.8 fL (ref 78.0–100.0)
MONO ABS: 0.5 10*3/uL (ref 0.1–1.0)
MONOS PCT: 5 % (ref 3–12)
NEUTROS PCT: 66 % (ref 43–77)
Neutro Abs: 6 10*3/uL (ref 1.7–7.7)
Platelets: 285 10*3/uL (ref 150–400)
RBC: 4.45 MIL/uL (ref 3.87–5.11)
RDW: 13.5 % (ref 11.5–15.5)
WBC: 9.1 10*3/uL (ref 4.0–10.5)

## 2014-05-29 LAB — BASIC METABOLIC PANEL
Anion gap: 8 (ref 5–15)
BUN: 17 mg/dL (ref 6–23)
CHLORIDE: 106 meq/L (ref 96–112)
CO2: 24 mmol/L (ref 19–32)
Calcium: 9.6 mg/dL (ref 8.4–10.5)
Creatinine, Ser: 0.72 mg/dL (ref 0.50–1.10)
GFR calc Af Amer: 90 mL/min — ABNORMAL LOW (ref >90)
GFR, EST NON AFRICAN AMERICAN: 77 mL/min — AB (ref >90)
Glucose, Bld: 103 mg/dL — ABNORMAL HIGH (ref 70–99)
POTASSIUM: 3.8 mmol/L (ref 3.5–5.1)
Sodium: 138 mmol/L (ref 135–145)

## 2014-05-29 LAB — PROTIME-INR
INR: 1.02 (ref 0.00–1.49)
Prothrombin Time: 13.5 seconds (ref 11.6–15.2)

## 2014-05-29 MED ORDER — OXYMETAZOLINE HCL 0.05 % NA SOLN
1.0000 | Freq: Once | NASAL | Status: AC
  Administered 2014-05-29: 1 via NASAL
  Filled 2014-05-29: qty 15

## 2014-05-29 NOTE — ED Provider Notes (Signed)
CSN: [id number]     Arrival date & time 05/29/14  1025 History   First MD Initiated Contact with Patient 05/29/14 1031     Chief Complaint  Patient presents with  . Epistaxis     (Consider location/radiation/quality/duration/timing/severity/associated sxs/prior Treatment) Patient is a 81 y.o. female presenting with nosebleeds.  Epistaxis Location:  L nare Severity:  Moderate Duration:  1 hour Timing:  Constant Progression:  Resolved Chronicity:  New Context: anticoagulants (on eliquis)   Context comment:  While showering Relieved by:  Nothing Worsened by:  Nothing tried Ineffective treatments:  None tried Associated symptoms: blood in oropharynx   Associated symptoms: no facial pain, no fever, no headaches and no sinus pain     Past Medical History  Diagnosis Date  . A-fib   . Multiple thyroid nodules   . Blood transfusion without reported diagnosis   . Hypertension   . Breast cancer     Left breast    Past Surgical History  Procedure Laterality Date  . Ovarian cyst surgery  1956  . Intraocular lens insertion      right  . Wrist fracture surgery  July 2010    Left/titanium  . Left breast biopsy  07/16/2013   Family History  Problem Relation Age of Onset  . Arrhythmia Mother     A-Fib  . Hyperlipidemia Mother   . Hypertension Mother   . Cancer Mother     breast cancer  . Cancer Sister    History  Substance Use Topics  . Smoking status: Never Smoker   . Smokeless tobacco: Not on file  . Alcohol Use: No   OB History    No data available     Review of Systems  Constitutional: Negative for fever.  HENT: Positive for nosebleeds.   Neurological: Negative for headaches.  All other systems reviewed and are negative.     Allergies  Sulfonamide derivatives and Amoxicillin  Home Medications   Prior to Admission medications   Medication Sig Start Date End Date Taking? Authorizing Provider  anastrozole (ARIMIDEX) 1 MG tablet Take 1 tablet (1 mg total)  by mouth daily. 08/13/13  Yes Chauncey Cruel, MD  apixaban (ELIQUIS) 5 MG TABS tablet Take 1 tablet (5 mg total) by mouth 2 (two) times daily. 10/31/13  Yes Thayer Headings, MD  Artificial Tear Ointment (DRY EYES OP) Apply 1 drop to eye daily as needed (dry eyes).   Yes Historical Provider, MD  calcium & magnesium carbonates (MYLANTA) 311-232 MG per tablet Take 1 tablet by mouth daily.   Yes Historical Provider, MD  CRANBERRY PO Take by mouth. 8 oz cranberry juice per day and alternates with Cranberry Pill 1 tablet once a day   Yes Historical Provider, MD  diltiazem (CARDIZEM) 30 MG tablet Take 1 tablet (30 mg total) by mouth daily. 10/31/13  Yes Thayer Headings, MD  Multiple Vitamin (MULTIVITAMIN) capsule Take 1 capsule by mouth daily.   Yes Historical Provider, MD  OVER THE COUNTER MEDICATION daily.    Yes Historical Provider, MD   BP 138/72 mmHg  Pulse 63  Temp(Src) 98.4 F (36.9 C) (Oral)  Resp 18  SpO2 96% Physical Exam  Constitutional: She is oriented to person, place, and time. She appears well-developed and well-nourished.  HENT:  Head: Normocephalic and atraumatic.  Right Ear: External ear normal.  Left Ear: External ear normal.  Resolved very minimal bleeding at L nare, no blood in posterior oropharynx  Eyes: Conjunctivae and EOM  are normal. Pupils are equal, round, and reactive to light.  Neck: Normal range of motion. Neck supple.  Cardiovascular: Normal rate, regular rhythm, normal heart sounds and intact distal pulses.   Pulmonary/Chest: Effort normal and breath sounds normal.  Abdominal: Soft. Bowel sounds are normal. There is no tenderness.  Musculoskeletal: Normal range of motion.  Neurological: She is alert and oriented to person, place, and time.  Skin: Skin is warm and dry.  Vitals reviewed.   ED Course  Procedures (including critical care time) Labs Review Labs Reviewed  BASIC METABOLIC PANEL - Abnormal; Notable for the following:    Glucose, Bld 103 (*)     GFR calc non Af Amer 77 (*)    GFR calc Af Amer 90 (*)    All other components within normal limits  CBC WITH DIFFERENTIAL  PROTIME-INR    Imaging Review No results found.   EKG Interpretation None      MDM   Final diagnoses:  Anterior epistaxis    81 y.o. female with pertinent PMH of afib on eliquis presents with left epistaxis. Symptoms lasted approximately 1 hour and resolved on pressure. On arrival she has a very small amount of dried blood in the left narrowing without obvious anterior source. She had a significant amount of bleeding in her oropharynx, however was leaning backwards and not applying pressure during this period on arrival her vital signs and physical exam are as above. No tachycardia. Lab work unremarkable. Discussed the possibility of posterior bleed with patient, however consider this unlikely given quick control with pressure.  Pt given afrin and ambulated without recurrence.  ENT fu provided.  Strict return precautions given.  I have reviewed all laboratory and imaging studies if ordered as above  1. Anterior epistaxis         Debby Freiberg, MD 05/30/14 [phone]

## 2014-05-29 NOTE — Discharge Instructions (Signed)

## 2014-05-29 NOTE — ED Notes (Signed)
Pt cleaned and gauze removed from nose and replaced. Bleeding is control with only mild oozing noted at this time. Pt is A&O and in NAD

## 2014-05-29 NOTE — ED Notes (Addendum)
Pt from home via EMS-Per EMS, pt c/o epistaxis that started 15 minutes prior to EMS arrival. Pt does not have hx of the same. Pt does take Eliquis for afib. Pt has no other c/o, denies pain. Pt is ambulatory, is A&Ox4 and in NAD. EMS has packed pt nose with gauze which has slowed bleeding

## 2014-05-29 NOTE — ED Notes (Signed)
Bed: CB83 Expected date:  Expected time:  Means of arrival:  Comments: EMS-nose bleed

## 2014-06-10 ENCOUNTER — Other Ambulatory Visit: Payer: Medicare HMO

## 2014-06-17 ENCOUNTER — Ambulatory Visit: Payer: Medicare HMO | Admitting: Oncology

## 2014-07-07 DIAGNOSIS — H2512 Age-related nuclear cataract, left eye: Secondary | ICD-10-CM | POA: Diagnosis not present

## 2014-07-07 DIAGNOSIS — Z961 Presence of intraocular lens: Secondary | ICD-10-CM | POA: Diagnosis not present

## 2014-07-07 DIAGNOSIS — H52201 Unspecified astigmatism, right eye: Secondary | ICD-10-CM | POA: Diagnosis not present

## 2014-07-07 DIAGNOSIS — H5202 Hypermetropia, left eye: Secondary | ICD-10-CM | POA: Diagnosis not present

## 2014-08-10 ENCOUNTER — Other Ambulatory Visit: Payer: Self-pay | Admitting: *Deleted

## 2014-08-10 DIAGNOSIS — C50319 Malignant neoplasm of lower-inner quadrant of unspecified female breast: Secondary | ICD-10-CM

## 2014-08-11 ENCOUNTER — Other Ambulatory Visit (HOSPITAL_BASED_OUTPATIENT_CLINIC_OR_DEPARTMENT_OTHER): Payer: Medicare PPO

## 2014-08-11 DIAGNOSIS — C50312 Malignant neoplasm of lower-inner quadrant of left female breast: Secondary | ICD-10-CM | POA: Diagnosis not present

## 2014-08-11 DIAGNOSIS — C50319 Malignant neoplasm of lower-inner quadrant of unspecified female breast: Secondary | ICD-10-CM

## 2014-08-11 LAB — CBC WITH DIFFERENTIAL/PLATELET
BASO%: 0.3 % (ref 0.0–2.0)
Basophils Absolute: 0 10*3/uL (ref 0.0–0.1)
EOS%: 1.9 % (ref 0.0–7.0)
Eosinophils Absolute: 0.2 10*3/uL (ref 0.0–0.5)
HCT: 41.8 % (ref 34.8–46.6)
HGB: 13.9 g/dL (ref 11.6–15.9)
LYMPH%: 30.3 % (ref 14.0–49.7)
MCH: 30 pg (ref 25.1–34.0)
MCHC: 33.3 g/dL (ref 31.5–36.0)
MCV: 90.1 fL (ref 79.5–101.0)
MONO#: 0.6 10*3/uL (ref 0.1–0.9)
MONO%: 5.3 % (ref 0.0–14.0)
NEUT%: 62.2 % (ref 38.4–76.8)
NEUTROS ABS: 6.5 10*3/uL (ref 1.5–6.5)
Platelets: 290 10*3/uL (ref 145–400)
RBC: 4.64 10*6/uL (ref 3.70–5.45)
RDW: 13.3 % (ref 11.2–14.5)
WBC: 10.4 10*3/uL — AB (ref 3.9–10.3)
lymph#: 3.1 10*3/uL (ref 0.9–3.3)

## 2014-08-11 LAB — COMPREHENSIVE METABOLIC PANEL (CC13)
ALBUMIN: 4.1 g/dL (ref 3.5–5.0)
ALT: 21 U/L (ref 0–55)
ANION GAP: 10 meq/L (ref 3–11)
AST: 21 U/L (ref 5–34)
Alkaline Phosphatase: 90 U/L (ref 40–150)
BUN: 21.1 mg/dL (ref 7.0–26.0)
CO2: 26 meq/L (ref 22–29)
Calcium: 9.8 mg/dL (ref 8.4–10.4)
Chloride: 108 mEq/L (ref 98–109)
Creatinine: 0.8 mg/dL (ref 0.6–1.1)
EGFR: 64 mL/min/{1.73_m2} — ABNORMAL LOW (ref >90)
Glucose: 115 mg/dl (ref 70–140)
POTASSIUM: 4.1 meq/L (ref 3.5–5.1)
SODIUM: 143 meq/L (ref 136–145)
TOTAL PROTEIN: 6.6 g/dL (ref 6.4–8.3)
Total Bilirubin: 0.3 mg/dL (ref 0.20–1.20)

## 2014-08-18 ENCOUNTER — Telehealth: Payer: Self-pay | Admitting: Oncology

## 2014-08-18 ENCOUNTER — Ambulatory Visit (HOSPITAL_BASED_OUTPATIENT_CLINIC_OR_DEPARTMENT_OTHER): Payer: Medicare PPO | Admitting: Oncology

## 2014-08-18 VITALS — BP 134/64 | HR 74 | Temp 98.4°F | Resp 18 | Ht 66.0 in | Wt 159.8 lb

## 2014-08-18 DIAGNOSIS — C50912 Malignant neoplasm of unspecified site of left female breast: Secondary | ICD-10-CM | POA: Diagnosis not present

## 2014-08-18 NOTE — Telephone Encounter (Signed)
per pof to sch pt appt-sch mamma-gave pt copy of sch

## 2014-08-18 NOTE — Progress Notes (Signed)
[facility]  [phone] [phone]     ID: Terri Ponce OB: [birth year]  MR#: [id number]  OXB#:[id number]  PCP: Terri Argyle, MD GYN:   SU: Terri Ponce OTHER MD: Terri Ponce  CHIEF COMPLAINT: Locally advanced left breast cancer  CURRENT TREATMENT: Neoadjuvant anastrozole   BREAST CANCER HISTORY: From the original intake Notes 08/13/2013:   Terri Ponce had screening mammography December of 2012 showing a low density oval nodule at the 9:00 position which was not palpable and which by ultrasound appeared to be a cyst. 6 month reevaluation along the 11/29/2011 showed a small nodule to have entirely resolved. Return to screening mammography was recommended.  However in January of 2015 the patient noted a change in her left breast which he initially felt was due to refer her bra rubbing on her skin. She was set up for bilateral diagnostic mammography and left breast ultrasonography at the breast center 06/20/2013. There was an asymmetry in the left breast area of palpable concern in exam showed a firm raised red area of spanning 3 cm along the far medial left breast inframammary fold. Ultrasound confirmed an area of skin thickening in ill-defined change measuring 1.7 cm. This extended to the skin. It was felt to be possibly due to an infected sebaceous cyst, and the patient was given antibiotics for a week  On 07/04/2013 repeat ultrasound of the left breast showed an irregular shadowing mass at the 8:00 position measuring 1.6 cm. The left axilla showed no lymphadenopathy. Physical exam showed a firm mass in this area. Biopsy was recommended and the patient's Rivaroxaban was temporarily stopped. On 07/16/2013, biopsy of the left breast 8:00 area in question showed ([id number]) and invasive lobular carcinoma (E-cadherin negative), grade 1, estrogen receptor 100% positive, progesterone receptor 74% positive, with an MIB-1 of 16% and no HER-2  amplification, the signals ratio being 1.29 and the number per cell 2.25.  On 07/25/2013 the patient underwent bilateral breast MRI. This showed in the lower inner quadrant of the left breast in the inframammary fold a 3.2 cm irregular enhancing mass containing a biopsy clip. Some spicules of enhancement extended posteriorly to the liver of the pectoralis. The mass abuts and appears to involve overlying skin. In addition, a 9 mm separate area of nodular enhancement was noted anterior and medial to the biopsied area. There were 2 left internal mammary lymph nodes measuring 4 mm and 2 mm. There was also a morphologically abnormal 1 cm lymph node in the left axilla. Incidental finding in the liver noted multiple masses favored to represent cysts.  On 08/04/2013 the patient underwent biopsy of the suspicious left axillary lymph node and this showed ([id number]) no evidence of malignancy. This was felt to be "possibly not concordant". Repeat MRI in 6 months (assuming neoadjuvant treatment) was recommended.  The patient has met with Dr. Donne Ponce and Dr. Pablo Ponce. Dr. Donne Ponce have set the patient up for biopsy of the satellite lesion in the left breast. She feels the patient would benefit from neoadjuvant endocrine therapy. Dr. Pablo Ponce feels the patient will need radiation, even if she eventually ends up having a mastectomy.  The patient's subsequent history is as detailed below.  INTERVAL HISTORY: "Terri Ponce" returns today for followup of her breast cancer accompanied by her daughter Terri Ponce. She continues on anastrozole, which she is tolerating well. Hot flashes are much diminished. Vaginal dryness is not a problem. She has not had the arthralgias and myalgias that many patients can experience  on this medication. She is obtaining it for about $6 a month.  REVIEW OF SYSTEMS: Terri Ponce continues to grieve appropriately for her husband. She is participating in a grade counseling group through hospice. She  has moved to a one floor house and is going through her old house belongings and settling that. This is a healing process for her even though of course it is difficult. She does have night sweats at times. She has some sinus issues including occasional mild epistaxis, hoarseness, and a rare dry cough. She can be short of breath when walking up a slope or up stairs. She has stress urinary incontinence. She bruises easily. She feels anxious, but not depressed. She is exercising by doing water aerobics and yoga. A detailed review of systems today was otherwise noncontributory  PAST MEDICAL HISTORY: Past Medical History  Diagnosis Date  . A-fib   . Multiple thyroid nodules   . Blood transfusion without reported diagnosis   . Hypertension   . Breast cancer     Left breast    possible history of TIAs  PAST SURGICAL HISTORY: Past Surgical History  Procedure Laterality Date  . Ovarian cyst surgery  1956  . Intraocular lens insertion      right  . Wrist fracture surgery  July 2010    Left/titanium  . Left breast biopsy  07/16/2013    FAMILY HISTORY Family History  Problem Relation Age of Onset  . Arrhythmia Mother     A-Fib  . Hyperlipidemia Mother   . Hypertension Mother   . Cancer Mother     breast cancer  . Cancer Sister    the patient's father died at the age of 65, from complications of asbestosis. The patient's mother died at the age of 85. She had been diagnosed with breast cancer a few years before. The patient had no brothers, one sister. His sister died at age 87 from cancer which was metastatic but the patient does not know the primary  GYNECOLOGIC HISTORY:  Menarche age 77, first live birth age 55, the patient is [place] P2. She went through menopause at around age 74 or 35. She did not take hormone replacement. She took birth control pills for some years without any clotting complications  SOCIAL HISTORY:  Terri Ponce has a Masters degree in public health, specifically nutrition.  Her husband Terri Ponce was an Chief Financial Officer and he worked for [employer] division. Daughter Terri Ponce (goes by "Terri Ponce") is a Architect with the [employer].  Son lives in [place] worry works as an Associate Professor. The patient has 2 grandchildren. She is a [employer]    ADVANCED DIRECTIVES:  in place; the patient's daughter is her healthcare power of attorney   HEALTH MAINTENANCE: History  Substance Use Topics  . Smoking status: Never Smoker   . Smokeless tobacco: Not on file  . Alcohol Use: No     Colonoscopy: Remote  PAP: remote  Bone density: 2014; "good"   Lipid panel:  Allergies  Allergen Reactions  . Sulfonamide Derivatives   . Amoxicillin Other (See Comments)    abdominal pain    Current Outpatient Prescriptions  Medication Sig Dispense Refill  . anastrozole (ARIMIDEX) 1 MG tablet Take 1 tablet (1 mg total) by mouth daily. 90 tablet 4  . apixaban (ELIQUIS) 5 MG TABS tablet Take 1 tablet (5 mg total) by mouth 2 (two) times daily. 180 tablet 3  . Artificial Tear Ointment (DRY EYES OP) Apply 1 drop to  eye daily as needed (dry eyes).    . calcium & magnesium carbonates (MYLANTA) 311-232 MG per tablet Take 1 tablet by mouth daily.    Marland Kitchen CRANBERRY PO Take by mouth. 8 oz cranberry juice per day and alternates with Cranberry Pill 1 tablet once a day    . diltiazem (CARDIZEM) 30 MG tablet Take 1 tablet (30 mg total) by mouth daily. 90 tablet 3  . Multiple Vitamin (MULTIVITAMIN) capsule Take 1 capsule by mouth daily.    Marland Kitchen OVER THE COUNTER MEDICATION daily.      No current facility-administered medications for this visit.    OBJECTIVE: Elderly white woman in no acute distress Filed Vitals:   08/18/14 1554  BP: 134/64  Pulse: 74  Temp: 98.4 F (36.9 C)  Resp: 18     Body mass index is 25.8 kg/(m^2).    ECOG FS:0 - Asymptomatic  Sclerae unicteric, EOMs intact Oropharynx clear, good dentition No cervical or supraclavicular adenopathy Lungs no rales or  rhonchi Heart regular rate and rhythm Abd soft, nontender, positive bowel sounds MSK no focal spinal tenderness, no upper extremity lymphedema Neuro: nonfocal, well oriented, appropriate affect Breasts: The right breast is unremarkable. I do not palpate any mass in the left breast and there are no skin or nipple changes of concern. The left axilla is benign.  LAB RESULTS:  CMP     Component Value Date/Time   NA 143 08/11/2014 1513   NA 138 05/29/2014 1056    I No results found for: SPEP  Lab Results  Component Value Date   WBC 10.4* 08/11/2014      Chemistry      Component Value Date/Time   NA 143 08/11/2014 1513   NA 138 05/29/2014 1056      Component Value Date/Time   CALCIUM 9.8 08/11/2014 1513   CALCIUM 9.6 05/29/2014 1056       No results found for: LABCA2  No components found for: LABCA125  No results for input(s): INR in the last 168 hours.  Urinalysis No results found for: COLORURINE  STUDIES: CLINICAL DATA: Thyroid nodule. Initial visit.  EXAM: THYROID ULTRASOUND  TECHNIQUE: Ultrasound examination of the thyroid gland and adjacent soft tissues was performed.  COMPARISON: None.  FINDINGS: Right thyroid lobe  Measurements: 4.2 x 1.4 x 1.7 cm. Heterogeneous tissue. 1.3 x 1.0 x 0.9 cm solid isoechoic nodule posteriorly. Other smaller nodules are present.  Left thyroid lobe  Measurements: 4.6 x 1.6 x 1.1 cm. Heterogeneous gland. 7 mm lower pole hypoechoic nodule. Other smaller upper pole nodules.  Isthmus  Thickness: 2 mm. No nodules visualized.  Lymphadenopathy  None visualized.  IMPRESSION: Small bilateral nodules. The dominant nodule is 1.3 cm in the right lobe. Findings do not meet current SRU consensus criteria for biopsy. Follow-up by clinical exam is recommended. If patient has known risk factors for thyroid carcinoma, consider follow-up ultrasound in 12 months. If patient is clinically  hyperthyroid, consider nuclear medicine thyroid uptake and scan.Reference: Management of Thyroid Nodules Detected at [place]: Society of Radiologists in [place]. Radiology 2005; [id number].   Electronically Signed  By: Maryclare Bean M.D.  On: 05/05/2014 16:03     ASSESSMENT: 81 y.o. [place] woman status post left breast biopsy 07/16/2013 for a clinically multifocal T2 N0-3 (stage IIA-IIIC) invasive lobular breast cancer, estrogen receptor 100% positive, progesterone receptor 74% positive, with an MIB-1 of 16% and no HER-2 amplification  (1) biopsy of a suspicious left axillary lymph node 08/04/2013 was  negative, but possibly discordant  (2) the patient has 2 suspicious left internal mammary lymph nodes as well as some indeterminate liver lesions which were negative on PET scan 08/26/2013 and by repeat breast MRI are c/w cysts  (3) MRI biopsy of a satellite lesion in the left breast 08/15/2013 showed atypical lobular hyperplasia.  (4) anastrozole started 08/13/2013  (a) breast MRI 01/20/2014 shows near complete resolution of the measurable mass  (5) DEXA scan 03/05/2013 at [facility] showed osteoporosis with the lowest T score at -2.7  PLAN: Hien Cunliffe is tolerating the anastrozole well. She essentially had a complete radiologic response after 6 months and that correlates 90% a time with a complete pathologic response. She knows whether or not she undergoes surgery she still going to need to take anastrozole for 5 years.  The big difference between having surgery are not is the fact that if she once to simply continue to follow the mass we will have to do more intensive surveillance. Specifically I would propose repeating an MRI this year and then possibly again 2 years later, of course doing mammography and ultrasonography yearly as needed.  At this point Sharlyne Koeneman is leaning towards continuing observation, but she will be meeting with Dr. Aura Dials in June to discuss that further. She will see me again in September after she has her mammography and breast MRI this year.  She has a good understanding of this plan. She agrees with it. She knows to call for any problems that may develop before her next visit here.   Chauncey Cruel, MD   08/18/2014 3:56 PM

## 2014-09-04 ENCOUNTER — Other Ambulatory Visit: Payer: Self-pay | Admitting: Dermatology

## 2014-09-20 HISTORY — PX: BASAL CELL CARCINOMA EXCISION: SHX1214

## 2014-09-20 HISTORY — PX: SQUAMOUS CELL CARCINOMA EXCISION: SHX2433

## 2014-09-21 ENCOUNTER — Ambulatory Visit (INDEPENDENT_AMBULATORY_CARE_PROVIDER_SITE_OTHER): Payer: Medicare PPO | Admitting: Cardiovascular Disease

## 2014-09-21 ENCOUNTER — Encounter: Payer: Self-pay | Admitting: Cardiovascular Disease

## 2014-09-21 VITALS — BP 130/64 | HR 70 | Ht 66.0 in | Wt 161.4 lb

## 2014-09-21 DIAGNOSIS — I480 Paroxysmal atrial fibrillation: Secondary | ICD-10-CM | POA: Diagnosis not present

## 2014-09-21 NOTE — Progress Notes (Signed)
Cardiology Office Note   Date:  09/21/2014   ID:  Terri Ponce, DOB [birth year], MRN [id number]  PCP:  Mathews Argyle, MD  Cardiologist:  Thayer Headings, MD   Chief Complaint  Patient presents with  . Atrial Fibrillation   1. Paroxysmal atrial fibrillation 2. Thyroid nodules 3. Hx of Hypertension  History of Present Illness:  Terri Ponce is an 81 year old female ([name]) who is seen today for further evaluation of paroxysmal atrial fibrillation. She has had smptoms for years - palps off and on for years. The palpitations are sporatic. She thinks they may be related to stress. She has a sensation in her left ribs. She is able to push through the palpitations and denies any CP or dyspnea associated with the arrhythmia. These palpitatioins   Recently she had some dyspnea at the beach and last week when she went to see Dr. Felipa Eth. She seems to have some slight dizziness and unsteadiness recently.   She has had HTN in the past - resolved with a better diet and exercise and weight loss program. BP now is ok.  She goes to the [employer] sne[employer] program And measures her BP several times a week. She has been having night sweats for the past several years.   Dec. 3, 2014:  Terri Ponce is seen today with her daughter Terri Ponce). She has tapered her Diltiazem 30 mg a day because of dizziness and nausea. We also tried slow release Cardizem but she had dizziness and nausea with that as well.   October 31, 2013:  Terri Ponce is doing well. Has a few palpitations - no prolonged episodes of Afib  04/01/2014:  Terri Ponce is seen today for a followup of her paroxysmal atrial fibrillation. She also has a history of hypertension. She has been depressed since 2023/02/26 after her husband died. She has had lots of burning and tingling sensation in her legs - especially at night.  She was seen by Christell Faith in the [place] office. Arterial ( ABI ) was  normal. Labs were unremarkable.   She finds that her peripheral her neuropathy symptoms would typically resolve if she moves her legs around in a bicycle type pattern.   Sep 21, 2014:  : Terri Ponce is a 81 y.o. female who presents for her hypertension and atrial fibrillation.  She is doing well. She has had some palpitations at night.  Lasted only a few minutes.  Took several deep breaths and felt better.  Turned over and felt better. Has had some nose bleeds in January.  No further episodes since .    Past Medical History  Diagnosis Date  . A-fib   . Multiple thyroid nodules   . Blood transfusion without reported diagnosis   . Hypertension   . Breast cancer     Left breast     Past Surgical History  Procedure Laterality Date  . Ovarian cyst surgery  1956  . Intraocular lens insertion      right  . Wrist fracture surgery  July 2010    Left/titanium  . Left breast biopsy  07/16/2013     Current Outpatient Prescriptions  Medication Sig Dispense Refill  . anastrozole (ARIMIDEX) 1 MG tablet Take 1 tablet (1 mg total) by mouth daily. 90 tablet 4  . apixaban (ELIQUIS) 5 MG TABS tablet Take 1 tablet (5 mg total) by mouth 2 (two) times daily. 180 tablet 3  . Artificial Tear Ointment (DRY EYES OP) Apply 1 drop  to eye daily as needed (dry eyes).    . calcium & magnesium carbonates (MYLANTA) 311-232 MG per tablet Take 1 tablet by mouth daily.    Marland Kitchen CRANBERRY PO Take by mouth. 8 oz cranberry juice per day and alternates with Cranberry Pill 1 tablet once a day    . diltiazem (CARDIZEM) 30 MG tablet Take 1 tablet (30 mg total) by mouth daily. 90 tablet 3  . Multiple Vitamin (MULTIVITAMIN) capsule Take 1 capsule by mouth daily.    Marland Kitchen OVER THE COUNTER MEDICATION daily.      No current facility-administered medications for this visit.    Allergies:   Sulfonamide derivatives and Amoxicillin    Social History:  The patient  reports that she has never smoked. She does not have any  smokeless tobacco history on file. She reports that she does not drink alcohol or use illicit drugs.   Family History:  The patient's family history includes Arrhythmia in her mother; Cancer in her mother and sister; Hyperlipidemia in her mother; Hypertension in her mother.    ROS:  Please see the history of present illness.    Review of Systems: Constitutional:  denies fever, chills, diaphoresis, appetite change and fatigue.  HEENT: denies photophobia, eye pain, redness, hearing loss, ear pain, congestion, sore throat, rhinorrhea, sneezing, neck pain, neck stiffness and tinnitus.  Respiratory: denies SOB, DOE, cough, chest tightness, and wheezing.  Cardiovascular: denies chest pain, palpitations and leg swelling.  Gastrointestinal: denies nausea, vomiting, abdominal pain, diarrhea, constipation, blood in stool.  Genitourinary: denies dysuria, urgency, frequency, hematuria, flank pain and difficulty urinating.  Musculoskeletal: denies  myalgias, back pain, joint swelling, arthralgias and gait problem.   Skin: denies pallor, rash and wound.  Neurological: denies dizziness, seizures, syncope, weakness, light-headedness, numbness and headaches.   Hematological: denies adenopathy, easy bruising, personal or family bleeding history.  Psychiatric/ Behavioral: denies suicidal ideation, mood changes, confusion, nervousness, sleep disturbance and agitation.       All other systems are reviewed and negative.    PHYSICAL EXAM: VS:  BP 130/64 mmHg  Pulse 70  Ht 5\' 6"  (1.676 m)  Wt 161 lb 6.4 oz (73.211 kg)  BMI 26.06 kg/m2 , BMI Body mass index is 26.06 kg/(m^2). GEN: Well nourished, well developed, in no acute distress HEENT: normal Neck: no JVD, carotid bruits, or masses Cardiac: RRR; no murmurs, rubs, or gallops,no edema  Respiratory:  clear to auscultation bilaterally, normal work of breathing GI: soft, nontender, nondistended, + BS MS: no deformity or atrophy Skin: warm and dry, no  rash Neuro:  Strength and sensation are intact Psych: normal   EKG:  EKG is not ordered today. The ekg ordered today demonstrates    Recent Labs: 08/11/2014: ALT 21; BUN 21.1; Creatinine 0.8; Hemoglobin 13.9; Platelets 290; Potassium 4.1; Sodium 143    Lipid Panel No results found for: CHOL, TRIG, HDL, CHOLHDL, VLDL, LDLCALC, LDLDIRECT    Wt Readings from Last 3 Encounters:  09/21/14 161 lb 6.4 oz (73.211 kg)  08/18/14 159 lb 12.8 oz (72.485 kg)  04/01/14 157 lb 12.8 oz (71.578 kg)      Other studies Reviewed: Additional studies/ records that were reviewed today include: . Review of the above records demonstrates:    ASSESSMENT AND PLAN:  1. Paroxysmal atrial fibrillation- is maintaining NSR , doing well.   Will see her in 1 year.  2. Thyroid nodules  3. Hx of Hypertension -  BP is well controlloled.   Current medicines are reviewed  at length with the patient today.  The patient has concerns regarding medicines.  The following changes have been made:  no change  Labs/ tests ordered today include:  No orders of the defined types were placed in this encounter.     Disposition:   FU with me in 1 year      Nahser, Wonda Cheng, MD  09/21/2014 9:31 AM    [address], [place], [place]  [address] Phone: [phone]; Fax: [phone]   [facility]  [address] [place], [place]  [address] [phone]    Fax [phone]

## 2014-09-21 NOTE — Patient Instructions (Signed)
Medication Instructions:  Your physician recommends that you continue on your current medications as directed. Please refer to the Current Medication list given to you today.   Labwork: None  Testing/Procedures: None  Follow-Up: Your physician wants you to follow-up in: 1 year with Dr. Nahser.  You will receive a reminder letter in the mail two months in advance. If you don't receive a letter, please call our office to schedule the follow-up appointment.      

## 2014-10-10 ENCOUNTER — Other Ambulatory Visit: Payer: Self-pay | Admitting: Oncology

## 2014-10-29 ENCOUNTER — Telehealth: Payer: Self-pay

## 2014-10-29 NOTE — Telephone Encounter (Signed)
Office note dtd 10/29/14 rcvd from [facility].  Reviewed by Dr. Jana Hakim.  Sent to scan.

## 2014-12-04 ENCOUNTER — Other Ambulatory Visit: Payer: Self-pay | Admitting: Cardiovascular Disease

## 2014-12-04 ENCOUNTER — Other Ambulatory Visit: Payer: Self-pay

## 2014-12-04 MED ORDER — DILTIAZEM HCL 30 MG PO TABS: 30.0000 mg | ORAL_TABLET | Freq: Every day | ORAL | Status: DC

## 2015-01-14 DIAGNOSIS — J040 Acute laryngitis: Secondary | ICD-10-CM | POA: Diagnosis not present

## 2015-01-18 ENCOUNTER — Ambulatory Visit
Admission: RE | Admit: 2015-01-18 | Discharge: 2015-01-18 | Disposition: A | Discharge status: Home / Self Care | Payer: Medicare PPO | Source: Ambulatory Visit | Attending: Oncology | Admitting: Oncology

## 2015-01-18 DIAGNOSIS — C50912 Malignant neoplasm of unspecified site of left female breast: Secondary | ICD-10-CM

## 2015-01-18 DIAGNOSIS — C50312 Malignant neoplasm of lower-inner quadrant of left female breast: Secondary | ICD-10-CM | POA: Diagnosis not present

## 2015-01-27 ENCOUNTER — Ambulatory Visit
Admission: RE | Admit: 2015-01-27 | Discharge: 2015-01-27 | Disposition: A | Discharge status: Home / Self Care | Payer: Medicare PPO | Source: Ambulatory Visit | Attending: Oncology | Admitting: Oncology

## 2015-01-27 DIAGNOSIS — C50919 Malignant neoplasm of unspecified site of unspecified female breast: Secondary | ICD-10-CM | POA: Diagnosis not present

## 2015-01-27 DIAGNOSIS — C50912 Malignant neoplasm of unspecified site of left female breast: Secondary | ICD-10-CM

## 2015-01-27 MED ORDER — GADOBENATE DIMEGLUMINE 529 MG/ML IV SOLN
15.0000 mL | Freq: Once | INTRAVENOUS | Status: AC | PRN
  Administered 2015-01-27: 15 mL via INTRAVENOUS

## 2015-01-28 ENCOUNTER — Other Ambulatory Visit: Payer: Self-pay | Admitting: Oncology

## 2015-02-01 ENCOUNTER — Other Ambulatory Visit: Payer: Self-pay | Admitting: *Deleted

## 2015-02-01 DIAGNOSIS — C50212 Malignant neoplasm of upper-inner quadrant of left female breast: Secondary | ICD-10-CM

## 2015-02-02 ENCOUNTER — Other Ambulatory Visit (HOSPITAL_BASED_OUTPATIENT_CLINIC_OR_DEPARTMENT_OTHER): Payer: Medicare PPO

## 2015-02-02 DIAGNOSIS — C50212 Malignant neoplasm of upper-inner quadrant of left female breast: Secondary | ICD-10-CM

## 2015-02-02 DIAGNOSIS — C50312 Malignant neoplasm of lower-inner quadrant of left female breast: Secondary | ICD-10-CM

## 2015-02-02 LAB — COMPREHENSIVE METABOLIC PANEL (CC13)
ALBUMIN: 4.1 g/dL (ref 3.5–5.0)
ALK PHOS: 87 U/L (ref 40–150)
ALT: 16 U/L (ref 0–55)
AST: 18 U/L (ref 5–34)
Anion Gap: 8 mEq/L (ref 3–11)
BILIRUBIN TOTAL: 0.51 mg/dL (ref 0.20–1.20)
BUN: 20.5 mg/dL (ref 7.0–26.0)
CALCIUM: 10.3 mg/dL (ref 8.4–10.4)
CO2: 30 mEq/L — ABNORMAL HIGH (ref 22–29)
Chloride: 105 mEq/L (ref 98–109)
Creatinine: 1 mg/dL (ref 0.6–1.1)
EGFR: 54 mL/min/{1.73_m2} — AB (ref >90)
Glucose: 129 mg/dl (ref 70–140)
POTASSIUM: 4.6 meq/L (ref 3.5–5.1)
Sodium: 142 mEq/L (ref 136–145)
TOTAL PROTEIN: 6.6 g/dL (ref 6.4–8.3)

## 2015-02-02 LAB — CBC WITH DIFFERENTIAL/PLATELET
BASO%: 0.9 % (ref 0.0–2.0)
Basophils Absolute: 0.1 10*3/uL (ref 0.0–0.1)
EOS ABS: 0.2 10*3/uL (ref 0.0–0.5)
EOS%: 2.5 % (ref 0.0–7.0)
HEMATOCRIT: 41 % (ref 34.8–46.6)
HEMOGLOBIN: 13.6 g/dL (ref 11.6–15.9)
LYMPH#: 2.6 10*3/uL (ref 0.9–3.3)
LYMPH%: 30.5 % (ref 14.0–49.7)
MCH: 29.1 pg (ref 25.1–34.0)
MCHC: 33.1 g/dL (ref 31.5–36.0)
MCV: 87.9 fL (ref 79.5–101.0)
MONO#: 0.5 10*3/uL (ref 0.1–0.9)
MONO%: 5.7 % (ref 0.0–14.0)
NEUT#: 5.2 10*3/uL (ref 1.5–6.5)
NEUT%: 60.4 % (ref 38.4–76.8)
Platelets: 286 10*3/uL (ref 145–400)
RBC: 4.66 10*6/uL (ref 3.70–5.45)
RDW: 13.7 % (ref 11.2–14.5)
WBC: 8.6 10*3/uL (ref 3.9–10.3)

## 2015-02-03 ENCOUNTER — Other Ambulatory Visit: Payer: Self-pay | Admitting: Oncology

## 2015-02-09 ENCOUNTER — Ambulatory Visit (HOSPITAL_BASED_OUTPATIENT_CLINIC_OR_DEPARTMENT_OTHER): Payer: Medicare PPO | Admitting: Oncology

## 2015-02-09 VITALS — BP 135/70 | HR 71 | Temp 97.7°F | Resp 18 | Ht 66.0 in | Wt 161.9 lb

## 2015-02-09 DIAGNOSIS — C50212 Malignant neoplasm of upper-inner quadrant of left female breast: Secondary | ICD-10-CM

## 2015-02-09 MED ORDER — ANASTROZOLE 1 MG PO TABS: 1.0000 mg | ORAL_TABLET | Freq: Every day | ORAL | Status: DC

## 2015-02-09 NOTE — Progress Notes (Signed)
[place]  [phone] [phone]     ID: Terri Ponce OB: 1928/09/05  MR#: [id number]  OBS#:[id number]  PCP: Terri Argyle, MD GYN:   SU: Terri Ponce OTHER MD: Terri Ponce  CHIEF COMPLAINT: Locally advanced left breast cancer  CURRENT TREATMENT: Neoadjuvant anastrozole   BREAST CANCER HISTORY: From the original intake Notes 08/13/2013:   Terri Ponce had screening mammography December of 2012 showing a low density oval nodule at the 9:00 position which was not palpable and which by ultrasound appeared to be a cyst. 6 month reevaluation along the 11/29/2011 showed a small nodule to have entirely resolved. Return to screening mammography was recommended.  However in January of 2015 the patient noted a change in her left breast which he initially felt was due to refer her bra rubbing on her skin. She was set up for bilateral diagnostic mammography and left breast ultrasonography at the breast center 06/20/2013. There was an asymmetry in the left breast area of palpable concern in exam showed a firm raised red area of spanning 3 cm along the far medial left breast inframammary fold. Ultrasound confirmed an area of skin thickening in ill-defined change measuring 1.7 cm. This extended to the skin. It was felt to be possibly due to an infected sebaceous cyst, and the patient was given antibiotics for a week  On 07/04/2013 repeat ultrasound of the left breast showed an irregular shadowing mass at the 8:00 position measuring 1.6 cm. The left axilla showed no lymphadenopathy. Physical exam showed a firm mass in this area. Biopsy was recommended and the patient's Rivaroxaban was temporarily stopped. On 07/16/2013, biopsy of the left breast 8:00 area in question showed ([id number]) and invasive lobular carcinoma (E-cadherin negative), grade 1, estrogen receptor 100% positive, progesterone receptor 74% positive, with an MIB-1 of 16% and no HER-2  amplification, the signals ratio being 1.29 and the number per cell 2.25.  On 07/25/2013 the patient underwent bilateral breast MRI. This showed in the lower inner quadrant of the left breast in the inframammary fold a 3.2 cm irregular enhancing mass containing a biopsy clip. Some spicules of enhancement extended posteriorly to the liver of the pectoralis. The mass abuts and appears to involve overlying skin. In addition, a 9 mm separate area of nodular enhancement was noted anterior and medial to the biopsied area. There were 2 left internal mammary lymph nodes measuring 4 mm and 2 mm. There was also a morphologically abnormal 1 cm lymph node in the left axilla. Incidental finding in the liver noted multiple masses favored to represent cysts.  On 08/04/2013 the patient underwent biopsy of the suspicious left axillary lymph node and this showed ([id number]) no evidence of malignancy. This was felt to be "possibly not concordant". Repeat MRI in 6 months (assuming neoadjuvant treatment) was recommended.  The patient has met with Dr. Donne Ponce and Dr. Pablo Ponce. Dr. Donne Ponce have set the patient up for biopsy of the satellite lesion in the left breast. She feels the patient would benefit from neoadjuvant endocrine therapy. Dr. Pablo Ponce feels the patient will need radiation, even if she eventually ends up having a mastectomy.  The patient's subsequent history is as detailed below.  INTERVAL HISTORY: "Terri Ponce" returns today for followup of her breast cancer accompanied by her daughter Terri Ponce.  Terri Ponce continues to tolerate the anastrozole without any side effects she is aware of. Hot flashes, vaginal dryness, and arthralgias/myalgias are either not an issue at all or no change from  prior baseline. The interval history is also significant for the adoption of a mixed poodle-longhaired chihuahua called Terri Ponce, which is causing Terri Ponce to walk around the neighborhood three times daily.  The patient just had  mammography with tomosynthesis 01/18/2015 which was negative (density category B). However the breast MRI shows increased enhancement in the left brest lower outer quadrant, suggesting progression.  REVIEW OF SYSTEMS: Terri Ponce has a variety of minor symptoms which are not new and not indicative of cancer spread. Occasionally she feels chilled. She describes herself as mildly fatigued. She has problems with her hearing and seasonal sinus issues. Occasionally she has a little bit of a dry cough. She has slight stress urinary incontinence. She tells me she has been depressed since the death of her spouse but she does enjoy the new dog which is company and exercise. Terri Ponce continues on apixaban, with no bleeding complications. A detailed review of systems today was otherwise stable  PAST MEDICAL HISTORY: Past Medical History  Diagnosis Date  . A-fib   . Multiple thyroid nodules   . Blood transfusion without reported diagnosis   . Hypertension   . Breast cancer     Left breast    possible history of TIAs  PAST SURGICAL HISTORY: Past Surgical History  Procedure Laterality Date  . Ovarian cyst surgery  1956  . Intraocular lens insertion      right  . Wrist fracture surgery  July 2010    Left/titanium  . Left breast biopsy  07/16/2013    FAMILY HISTORY Family History  Problem Relation Age of Onset  . Arrhythmia Mother     A-Fib  . Hyperlipidemia Mother   . Hypertension Mother   . Cancer Mother     breast cancer  . Cancer Sister    the patient's father died at the age of 96, from complications of asbestosis. The patient's mother died at the age of 4. She had been diagnosed with breast cancer a few years before. The patient had no brothers, one sister. His sister died at age 49 from cancer which was metastatic but the patient does not know the primary  GYNECOLOGIC HISTORY:  Menarche age 15, first live birth age 66, the patient is [place] P2. She went through menopause at around age 33 or  32. She did not take hormone replacement. She took birth control pills for some years without any clotting complications  SOCIAL HISTORY:  Terri Ponce has a Masters degree in public health, specifically nutrition. Her husband Merrilee Seashore was an Chief Financial Officer and he worked for [employer] division. Daughter Alantra Popoca (goes by "Terri Ponce") is a Architect with the [employer].  Son lives in [place] worry works as an Associate Professor. The patient has 2 grandchildren. She is a [employer]    ADVANCED DIRECTIVES:  in place; the patient's daughter is her healthcare power of attorney   HEALTH MAINTENANCE: Social History  Substance Use Topics  . Smoking status: Never Smoker   . Smokeless tobacco: Not on file  . Alcohol Use: No     Colonoscopy: Remote  PAP: remote  Bone density: 2014; "good"   Lipid panel:  Allergies  Allergen Reactions  . Sulfonamide Derivatives   . Amoxicillin Other (See Comments)    abdominal pain    Current Outpatient Prescriptions  Medication Sig Dispense Refill  . anastrozole (ARIMIDEX) 1 MG tablet TAKE ONE TABLET BY MOUTH ONCE DAILY 90 tablet 0  . apixaban (ELIQUIS) 5 MG TABS tablet Take  1 tablet (5 mg total) by mouth 2 (two) times daily. 180 tablet 3  . Artificial Tear Ointment (DRY EYES OP) Apply 1 drop to eye daily as needed (dry eyes).    . calcium & magnesium carbonates (MYLANTA) 311-232 MG per tablet Take 1 tablet by mouth daily.    Marland Kitchen CRANBERRY PO Take by mouth. 8 oz cranberry juice per day and alternates with Cranberry Pill 1 tablet once a day    . diltiazem (CARDIZEM) 30 MG tablet Take 1 tablet (30 mg total) by mouth daily. 90 tablet 3  . Multiple Vitamin (MULTIVITAMIN) capsule Take 1 capsule by mouth daily.    Marland Kitchen OVER THE COUNTER MEDICATION daily.      No current facility-administered medications for this visit.    OBSERVATION: Terri Ponce in no acute distress Filed Vitals:   02/09/15 1521  BP: 135/70  Pulse: 71  Temp: 97.7 F (36.5  C)  Resp: 18     Body mass index is 26.14 kg/(m^2).    ECOG FS:1 - Symptomatic but completely ambulatory  Sclerae unicteric, pupils round and equal Oropharynx clear and moist-- no thrush or other lesions No cervical or supraclavicular adenopathy Lungs no rales or rhonchi Heart regular rate and rhythm Abd soft, nontender, positive bowel sounds MSK no focal spinal tenderness, no upper extremity lymphedema Neuro: nonfocal, well oriented, positive affect Breasts: The right breast is unremarkable. I do not palpate a mass in the left breast and there are no skin or nipple changes of concern. The left axilla is benign.    LAB RESULTS:  CMP     Component Value Date/Time   NA 142 02/02/2015 1438   NA 138 05/29/2014 1056   NA 139 03/09/2014 1625    I No results found for: SPEP  Lab Results  Component Value Date   WBC 8.6 02/02/2015      Chemistry      Component Value Date/Time   NA 142 02/02/2015 1438   NA 138 05/29/2014 1056   NA 139 03/09/2014 1625      Component Value Date/Time   CALCIUM 10.3 02/02/2015 1438   CALCIUM 9.6 05/29/2014 1056   CALCIUM 8.8 03/09/2014 1625       No results found for: LABCA2  No components found for: LABCA125  No results for input(s): INR in the last 168 hours.  Urinalysis No results found for: COLORURINE  STUDIES: Mr Breast Bilateral W Wo Contrast  01/27/2015   CLINICAL DATA:  81 year old female with biopsy-proven invasive lobular carcinoma and LCIS of the lower-inner quadrant of the left breast diagnosed on 07/16/2013 by ultrasound-guided biopsy (ribbon shaped marking clip). Subsequently, a MRI guided biopsy was performed of a 0.9 cm focus of nodular enhancement anterior to the original biopsy (dumbell shaped clip), which demonstrated atypical lobular hyperplasia. The patient is currently on neoadjuvant anastrozole, currently without plans for surgery.  LABS:  The most recent laboratory values were obtained on 08/11/2014 with  creatinine of 0.9 mg/dL and GFR greater than 60.  EXAM: BILATERAL BREAST MRI WITH AND WITHOUT CONTRAST  TECHNIQUE: Multiplanar, multisequence MR images of both breasts were obtained prior to and following the intravenous administration of 15 ml of MultiHance.  THREE-DIMENSIONAL MR IMAGE RENDERING ON INDEPENDENT WORKSTATION:  Three-dimensional MR images were rendered by post-processing of the original MR data on an independent workstation. The three-dimensional MR images were interpreted, and findings are reported in the following complete MRI report for this study. Three dimensional images were evaluated at the  independent DynaCad workstation  COMPARISON:  Previous exam(s).  FINDINGS: Breast composition: b. Scattered fibroglandular tissue.  Background parenchymal enhancement: Mild.  Right breast:  No masses or suspicious non mass enhancement.  Left breast: In the lower inner quadrant of the left breast at approximately 8 o'clock, posterior depth at the site of biopsy proven malignancy, there is an irregular 2.8 x 2.7 cm (axial plane) enhancing mass, with extension to the skin. This area was barely visible on the most recent prior MRI. No definite enhancement of the pectoralis musculature. Susceptibility is seen at this site, compatible with a biopsy marking clip. There is also susceptibility at the approximate 6 o'clock location, middle depth with minimal surrounding enhancement, compatible with the biopsy-proven site of [facility]. The appearance is not significantly changed from prior. No new suspicious areas of enhancement are seen in the left breast.  Lymph nodes: No abnormal appearing lymph nodes.  Ancillary findings: Multiple large T2 bright lesions in the liver are stable, likely representing cysts.  IMPRESSION: 1. Interval increase in enhancement at the site of biopsy proven malignancy in the lower inner quadrant of the left breast concerning for worsening disease as the enhancement had near completely resolved on  the prior exam. A enhancement again is noted to extend to the skin. No new sites of disease are seen in the left breast.  2.  No MRI evidence of malignancy in the right breast.  RECOMMENDATION: Continued treatment plan.  BI-RADS CATEGORY  BI-RADS 6:  Known malignancy.   Electronically Signed   By: Ammie Ferrier M.D.   On: 01/27/2015 16:50   Mm Diag Breast Tomo Bilateral  01/18/2015   CLINICAL DATA:  81 year old female presenting for monitoring of known cancer in the lower inner quadrant of the left breast, posterior depth. This was diagnosed by biopsy of the left breast in February of 2015. Currently, no surgery is planned, and the patient is being treated with anastrozole.  EXAM: DIGITAL DIAGNOSTIC BILATERAL MAMMOGRAM WITH 3D TOMOSYNTHESIS AND CAD  COMPARISON:  Previous exam(s).  ACR Breast Density Category b: There are scattered areas of fibroglandular density.  FINDINGS: Two biopsy marking clips are seen in the lower inner quadrant of the left breast, stable in position. There is interval decrease in size and density of the the irregular mass in the lower inner quadrant of the left breast adjacent to the biopsy marking clips. No discrete measurable mass is visualized. No new masses, areas of distortion or suspicious calcifications are identified.  Mammographic images were processed with CAD.  IMPRESSION: Interval decrease in size and density of the irregular mass in the lower inner quadrant of the left breast, compatible with improved biopsy proven malignancy. No discrete measurable mass is visualized.  RECOMMENDATION: Continue treatment plan.  I have discussed the findings and recommendations with the patient. Results were also provided in writing at the conclusion of the visit. If applicable, a reminder letter will be sent to the patient regarding the next appointment.  BI-RADS CATEGORY  6: Known biopsy-proven malignancy.   Electronically Signed   By: Ammie Ferrier M.D.   On: 01/18/2015 16:48     ASSESSMENT: 81 y.o. Hidalgo Ponce status post left breast biopsy 07/16/2013 for a clinically multifocal T2 N0-3 (stage IIA-IIIC) invasive lobular breast cancer, estrogen receptor 100% positive, progesterone receptor 74% positive, with an MIB-1 of 16% and no HER-2 amplification  (1) biopsy of a suspicious left axillary lymph node 08/04/2013 was negative, but possibly discordant  (2) the patient has 2 suspicious  left internal mammary lymph nodes as well as some indeterminate liver lesions which were negative on PET scan 08/26/2013 and by repeat breast MRI are c/w cysts  (3) MRI biopsy of a satellite lesion in the left breast 08/15/2013 showed atypical lobular hyperplasia.  (4) anastrozole started 08/13/2013  (a) breast MRI 01/20/2014 shows near complete resolution of the measurable mass  (b) breast MRI 01/27/2015 shows evidence of progression  (5) DEXA scan 03/05/2013 at [place] showed osteoporosis with the lowest T score at -2.7  PLAN: We spent quite a bit of time today going over all the MRI images. The change described is very slight to my eye, but I don't have all the images the radiologist is looking at. I think it significant that we see nothing on the mammogram even with tomosynthesis and even in a category B density breast. I also do not palpate a mass in the area in question.  Nevertheless the MRI suggests that there may be slight progression on anastrozole. At this point I think it would be most prudent to proceed to lumpectomy.  We don't see any axillary adenopathy by mammogram or MRI and there is no palpable adenopathy in the axilla. Since there would be no plan for chemotherapy even if we documented axillary lymph nodes, most likely sentinel lymph node biopsy will not be necessary. The patient is interested in undergoing any surgery under local if at all possible. She will be discussing all these options with Dr. Donne Ponce when she sees in the near future  She is on  apixaban and we can easily do a Lovenox bridge to optimize her coagulation therapy and minimize her bleeding from the surgery once we have the date.  At this point, then, the plan is to refer the patient back to Dr. Donne Ponce for consideration of surgery. I have requested that her case be presented at the September 28 multidisciplinary clinic so we can all review the case. She will see me again in about 2 months. I do not see much point in switching from anastrozole to another aromatase inhibitor and she is not a good candidate for tamoxifen. Accordingly most likely we will continue on anastrozole until there is evidence of further progression. Chauncey Cruel, MD   02/09/2015 4:08 PM

## 2015-02-10 ENCOUNTER — Telehealth: Payer: Self-pay | Admitting: Oncology

## 2015-02-10 NOTE — Telephone Encounter (Signed)
No answer, unable to leave voicemail. Mailed calendar for November

## 2015-03-01 DIAGNOSIS — G43909 Migraine, unspecified, not intractable, without status migrainosus: Secondary | ICD-10-CM | POA: Diagnosis not present

## 2015-03-11 DIAGNOSIS — C50312 Malignant neoplasm of lower-inner quadrant of left female breast: Secondary | ICD-10-CM | POA: Diagnosis not present

## 2015-03-18 ENCOUNTER — Encounter: Payer: Self-pay | Admitting: Nurse Practitioner

## 2015-03-25 ENCOUNTER — Other Ambulatory Visit: Payer: Self-pay | Admitting: General Surgery

## 2015-03-25 DIAGNOSIS — C50212 Malignant neoplasm of upper-inner quadrant of left female breast: Secondary | ICD-10-CM

## 2015-03-29 DIAGNOSIS — H906 Mixed conductive and sensorineural hearing loss, bilateral: Secondary | ICD-10-CM | POA: Diagnosis not present

## 2015-03-29 DIAGNOSIS — H6123 Impacted cerumen, bilateral: Secondary | ICD-10-CM | POA: Diagnosis not present

## 2015-04-02 ENCOUNTER — Other Ambulatory Visit: Payer: Self-pay | Admitting: General Surgery

## 2015-04-02 DIAGNOSIS — C50212 Malignant neoplasm of upper-inner quadrant of left female breast: Secondary | ICD-10-CM

## 2015-04-05 ENCOUNTER — Other Ambulatory Visit (HOSPITAL_COMMUNITY): Payer: Self-pay | Admitting: *Deleted

## 2015-04-05 ENCOUNTER — Encounter (HOSPITAL_COMMUNITY)
Admission: RE | Admit: 2015-04-05 | Discharge: 2015-04-05 | Disposition: A | Discharge status: Home / Self Care | Payer: Medicare PPO | Source: Ambulatory Visit | Attending: General Surgery | Admitting: General Surgery

## 2015-04-05 ENCOUNTER — Encounter (HOSPITAL_COMMUNITY): Payer: Self-pay

## 2015-04-05 DIAGNOSIS — Z170 Estrogen receptor positive status [ER+]: Secondary | ICD-10-CM | POA: Diagnosis not present

## 2015-04-05 DIAGNOSIS — Z7982 Long term (current) use of aspirin: Secondary | ICD-10-CM | POA: Diagnosis not present

## 2015-04-05 DIAGNOSIS — I4891 Unspecified atrial fibrillation: Secondary | ICD-10-CM | POA: Diagnosis not present

## 2015-04-05 DIAGNOSIS — I10 Essential (primary) hypertension: Secondary | ICD-10-CM | POA: Diagnosis not present

## 2015-04-05 DIAGNOSIS — Z880 Allergy status to penicillin: Secondary | ICD-10-CM | POA: Diagnosis not present

## 2015-04-05 DIAGNOSIS — Z887 Allergy status to serum and vaccine status: Secondary | ICD-10-CM | POA: Diagnosis not present

## 2015-04-05 DIAGNOSIS — C50312 Malignant neoplasm of lower-inner quadrant of left female breast: Secondary | ICD-10-CM | POA: Diagnosis not present

## 2015-04-05 DIAGNOSIS — K219 Gastro-esophageal reflux disease without esophagitis: Secondary | ICD-10-CM | POA: Diagnosis not present

## 2015-04-05 DIAGNOSIS — Z882 Allergy status to sulfonamides status: Secondary | ICD-10-CM | POA: Diagnosis not present

## 2015-04-05 DIAGNOSIS — Z9889 Other specified postprocedural states: Secondary | ICD-10-CM | POA: Diagnosis not present

## 2015-04-05 DIAGNOSIS — Z7901 Long term (current) use of anticoagulants: Secondary | ICD-10-CM | POA: Diagnosis not present

## 2015-04-05 DIAGNOSIS — I2510 Atherosclerotic heart disease of native coronary artery without angina pectoris: Secondary | ICD-10-CM | POA: Diagnosis not present

## 2015-04-05 HISTORY — DX: Major depressive disorder, single episode, unspecified: F32.9

## 2015-04-05 HISTORY — DX: Anemia, unspecified: D64.9

## 2015-04-05 HISTORY — DX: Other specified postprocedural states: Z98.890

## 2015-04-05 HISTORY — DX: Nausea with vomiting, unspecified: R11.2

## 2015-04-05 HISTORY — DX: Depression, unspecified: F32.A

## 2015-04-05 HISTORY — DX: Reserved for inherently not codable concepts without codable children: IMO0001

## 2015-04-05 LAB — CBC WITH DIFFERENTIAL/PLATELET
BASOS ABS: 0 10*3/uL (ref 0.0–0.1)
BASOS PCT: 0 %
EOS ABS: 0.3 10*3/uL (ref 0.0–0.7)
Eosinophils Relative: 3 %
HCT: 43.2 % (ref 36.0–46.0)
HEMOGLOBIN: 14.2 g/dL (ref 12.0–15.0)
Lymphocytes Relative: 33 %
Lymphs Abs: 3.1 10*3/uL (ref 0.7–4.0)
MCH: 29.5 pg (ref 26.0–34.0)
MCHC: 32.9 g/dL (ref 30.0–36.0)
MCV: 89.8 fL (ref 78.0–100.0)
Monocytes Absolute: 0.6 10*3/uL (ref 0.1–1.0)
Monocytes Relative: 6 %
NEUTROS PCT: 58 %
Neutro Abs: 5.3 10*3/uL (ref 1.7–7.7)
PLATELETS: 283 10*3/uL (ref 150–400)
RBC: 4.81 MIL/uL (ref 3.87–5.11)
RDW: 13.7 % (ref 11.5–15.5)
WBC: 9.3 10*3/uL (ref 4.0–10.5)

## 2015-04-05 LAB — BASIC METABOLIC PANEL
Anion gap: 8 (ref 5–15)
BUN: 15 mg/dL (ref 6–20)
CALCIUM: 10.5 mg/dL — AB (ref 8.9–10.3)
CHLORIDE: 104 mmol/L (ref 101–111)
CO2: 26 mmol/L (ref 22–32)
CREATININE: 0.81 mg/dL (ref 0.44–1.00)
Glucose, Bld: 94 mg/dL (ref 65–99)
Potassium: 4.3 mmol/L (ref 3.5–5.1)
SODIUM: 138 mmol/L (ref 135–145)

## 2015-04-05 NOTE — Pre-Procedure Instructions (Signed)
Terri Ponce  04/05/2015     Your procedure is scheduled on Thursday, April 08, 2015 at 9:30 AM.   Report to [facility] Entrance "A" Admitting Office at 7:30 AM.   Call this number if you have problems the morning of surgery: [phone]   Any questions prior to day of surgery, please call [phone] between 8 & 4 PM.   Remember:  Do not eat food or drink liquids after midnight Wednesday, 04/07/15.  Take these medicines the morning of surgery with A SIP OF WATER: Tylenol - if needed, Saline drops - if needed  Stop Multivitamins and Herbal medications as of today.   Do not wear jewelry, make-up or nail polish.  Do not wear lotions, powders, or perfumes.  You may NOT wear deodorant.  Do not shave 48 hours prior to surgery.    Do not bring valuables to the hospital.  [facility] is not responsible for any belongings or valuables.  Contacts, dentures or bridgework may not be worn into surgery.  Leave your suitcase in the car.  After surgery it may be brought to your room.  For patients admitted to the hospital, discharge time will be determined by your treatment team.  Special instructions:  [facility] - Preparing for Surgery  Before surgery, you can play an important role.  Because skin is not sterile, your skin needs to be as free of germs as possible.  You can reduce the number of germs on you skin by washing with CHG (chlorahexidine gluconate) soap before surgery.  CHG is an antiseptic cleaner which kills germs and bonds with the skin to continue killing germs even after washing.  Please DO NOT use if you have an allergy to CHG or antibacterial soaps.  If your skin becomes reddened/irritated stop using the CHG and inform your nurse when you arrive at Short Stay.  Do not shave (including legs and underarms) for at least 48 hours prior to the first CHG shower.  You may shave your face.  Please follow these instructions carefully:   1.  Shower with CHG Soap  the night before surgery and the                                morning of Surgery.  2.  If you choose to wash your hair, wash your hair first as usual with your       normal shampoo.  3.  After you shampoo, rinse your hair and body thoroughly to remove the                      Shampoo.  4.  Use CHG as you would any other liquid soap.  You can apply chg directly       to the skin and wash gently with scrungie or a clean washcloth.  5.  Apply the CHG Soap to your body ONLY FROM THE NECK DOWN.        Do not use on open wounds or open sores.  Avoid contact with your eyes, ears, mouth and genitals (private parts).  Wash genitals (private parts) with your normal soap.  6.  Wash thoroughly, paying special attention to the area where your surgery        will be performed.  7.  Thoroughly rinse your body with warm water from the neck down.  8.  DO NOT shower/wash with  your normal soap after using and rinsing off       the CHG Soap.  9.  Pat yourself dry with a clean towel.            10.  Wear clean pajamas.            11.  Place clean sheets on your bed the night of your first shower and do not        sleep with pets.  Day of Surgery  Do not apply any lotions/deodorants the morning of surgery.  Please wear clean clothes to the hospital.   Please read over the following fact sheets that you were given. Pain Booklet, Coughing and Deep Breathing and Surgical Site Infection Prevention

## 2015-04-05 NOTE — Progress Notes (Signed)
Pt has history of Atrial Fib and sees Dr. Acie Fredrickson. She denies chest pain or sob. She states Dr. Acie Fredrickson instructed her to stop Eliquis 2 days prior to surgery. Her last dose will be tonight.

## 2015-04-06 ENCOUNTER — Ambulatory Visit
Admission: RE | Admit: 2015-04-06 | Discharge: 2015-04-06 | Disposition: A | Discharge status: Home / Self Care | Payer: Medicare PPO | Source: Ambulatory Visit | Attending: General Surgery | Admitting: General Surgery

## 2015-04-06 DIAGNOSIS — Z9012 Acquired absence of left breast and nipple: Secondary | ICD-10-CM | POA: Diagnosis not present

## 2015-04-06 DIAGNOSIS — C50212 Malignant neoplasm of upper-inner quadrant of left female breast: Secondary | ICD-10-CM

## 2015-04-06 DIAGNOSIS — Z853 Personal history of malignant neoplasm of breast: Secondary | ICD-10-CM | POA: Diagnosis not present

## 2015-04-08 ENCOUNTER — Ambulatory Visit (HOSPITAL_COMMUNITY): Payer: Medicare PPO | Admitting: Certified Registered Nurse Anesthetist

## 2015-04-08 ENCOUNTER — Encounter (HOSPITAL_COMMUNITY): Payer: Self-pay | Admitting: Certified Registered Nurse Anesthetist

## 2015-04-08 ENCOUNTER — Encounter (HOSPITAL_COMMUNITY)
Admission: RE | Disposition: A | Discharge status: Home / Self Care | Payer: Self-pay | Source: Ambulatory Visit | Attending: General Surgery

## 2015-04-08 ENCOUNTER — Observation Stay (HOSPITAL_COMMUNITY)
Admission: RE | Admit: 2015-04-08 | Discharge: 2015-04-09 | Disposition: A | Discharge status: Home / Self Care | Payer: Medicare PPO | Source: Ambulatory Visit | Attending: General Surgery | Admitting: General Surgery

## 2015-04-08 ENCOUNTER — Ambulatory Visit
Admission: RE | Admit: 2015-04-08 | Discharge: 2015-04-08 | Disposition: A | Discharge status: Home / Self Care | Payer: Medicare PPO | Source: Ambulatory Visit | Attending: General Surgery | Admitting: General Surgery

## 2015-04-08 DIAGNOSIS — C50312 Malignant neoplasm of lower-inner quadrant of left female breast: Secondary | ICD-10-CM | POA: Diagnosis not present

## 2015-04-08 DIAGNOSIS — C50919 Malignant neoplasm of unspecified site of unspecified female breast: Secondary | ICD-10-CM | POA: Diagnosis present

## 2015-04-08 DIAGNOSIS — Z880 Allergy status to penicillin: Secondary | ICD-10-CM | POA: Insufficient documentation

## 2015-04-08 DIAGNOSIS — Z7901 Long term (current) use of anticoagulants: Secondary | ICD-10-CM | POA: Insufficient documentation

## 2015-04-08 DIAGNOSIS — K219 Gastro-esophageal reflux disease without esophagitis: Secondary | ICD-10-CM | POA: Insufficient documentation

## 2015-04-08 DIAGNOSIS — Z882 Allergy status to sulfonamides status: Secondary | ICD-10-CM | POA: Diagnosis not present

## 2015-04-08 DIAGNOSIS — I4891 Unspecified atrial fibrillation: Secondary | ICD-10-CM | POA: Insufficient documentation

## 2015-04-08 DIAGNOSIS — C50912 Malignant neoplasm of unspecified site of left female breast: Secondary | ICD-10-CM | POA: Diagnosis not present

## 2015-04-08 DIAGNOSIS — Z9889 Other specified postprocedural states: Secondary | ICD-10-CM | POA: Insufficient documentation

## 2015-04-08 DIAGNOSIS — I2510 Atherosclerotic heart disease of native coronary artery without angina pectoris: Secondary | ICD-10-CM | POA: Insufficient documentation

## 2015-04-08 DIAGNOSIS — Z9012 Acquired absence of left breast and nipple: Secondary | ICD-10-CM | POA: Diagnosis not present

## 2015-04-08 DIAGNOSIS — Z7982 Long term (current) use of aspirin: Secondary | ICD-10-CM | POA: Insufficient documentation

## 2015-04-08 DIAGNOSIS — C50212 Malignant neoplasm of upper-inner quadrant of left female breast: Secondary | ICD-10-CM

## 2015-04-08 DIAGNOSIS — Z887 Allergy status to serum and vaccine status: Secondary | ICD-10-CM | POA: Insufficient documentation

## 2015-04-08 DIAGNOSIS — R928 Other abnormal and inconclusive findings on diagnostic imaging of breast: Secondary | ICD-10-CM | POA: Diagnosis not present

## 2015-04-08 DIAGNOSIS — Z170 Estrogen receptor positive status [ER+]: Secondary | ICD-10-CM | POA: Insufficient documentation

## 2015-04-08 DIAGNOSIS — I10 Essential (primary) hypertension: Secondary | ICD-10-CM | POA: Insufficient documentation

## 2015-04-08 HISTORY — DX: Malignant neoplasm of unspecified site of left female breast: C50.912

## 2015-04-08 HISTORY — DX: Unspecified osteoarthritis, unspecified site: M19.90

## 2015-04-08 HISTORY — DX: Anxiety disorder, unspecified: F41.9

## 2015-04-08 HISTORY — DX: Transient visual loss, right eye: H53.121

## 2015-04-08 HISTORY — PX: BREAST LUMPECTOMY: SHX2

## 2015-04-08 HISTORY — DX: Personal history of other medical treatment: Z92.89

## 2015-04-08 HISTORY — PX: BREAST LUMPECTOMY WITH RADIOACTIVE SEED LOCALIZATION: SHX6424

## 2015-04-08 HISTORY — DX: Hepatitis a without hepatic coma: B15.9

## 2015-04-08 HISTORY — DX: Pneumonia, unspecified organism: J18.9

## 2015-04-08 HISTORY — DX: Other subjective visual disturbances: H53.19

## 2015-04-08 LAB — PROTIME-INR
INR: 1.01 (ref 0.00–1.49)
Prothrombin Time: 13.5 seconds (ref 11.6–15.2)

## 2015-04-08 SURGERY — BREAST LUMPECTOMY WITH RADIOACTIVE SEED LOCALIZATION
Anesthesia: General | Site: Breast | Laterality: Left

## 2015-04-08 MED ORDER — ACETAMINOPHEN 325 MG PO TABS
650.0000 mg | ORAL_TABLET | Freq: Four times a day (QID) | ORAL | Status: DC | PRN
  Administered 2015-04-08 – 2015-04-09 (×3): 650 mg via ORAL
  Filled 2015-04-08 (×4): qty 2

## 2015-04-08 MED ORDER — ONDANSETRON HCL 4 MG/2ML IJ SOLN: 4.0000 mg | Freq: Four times a day (QID) | INTRAMUSCULAR | Status: DC | PRN

## 2015-04-08 MED ORDER — ASPIRIN EC 81 MG PO TBEC: 81.0000 mg | DELAYED_RELEASE_TABLET | Freq: Every day | ORAL | Status: DC

## 2015-04-08 MED ORDER — PHENYLEPHRINE HCL 10 MG/ML IJ SOLN
INTRAMUSCULAR | Status: DC | PRN
  Administered 2015-04-08: 40 ug via INTRAVENOUS

## 2015-04-08 MED ORDER — CEFAZOLIN SODIUM-DEXTROSE 2-3 GM-% IV SOLR
INTRAVENOUS | Status: DC | PRN
  Administered 2015-04-08: 2 g via INTRAVENOUS

## 2015-04-08 MED ORDER — PROPOFOL 10 MG/ML IV BOLUS
INTRAVENOUS | Status: DC | PRN
  Administered 2015-04-08: 120 mg via INTRAVENOUS
  Administered 2015-04-08: 30 mg via INTRAVENOUS

## 2015-04-08 MED ORDER — HYDROCODONE-ACETAMINOPHEN 5-325 MG PO TABS: 1.0000 | ORAL_TABLET | ORAL | Status: DC | PRN

## 2015-04-08 MED ORDER — SODIUM CHLORIDE 0.9 % IV SOLN
INTRAVENOUS | Status: DC
  Administered 2015-04-08 – 2015-04-09 (×2): via INTRAVENOUS

## 2015-04-08 MED ORDER — FENTANYL CITRATE (PF) 100 MCG/2ML IJ SOLN
INTRAMUSCULAR | Status: AC
  Filled 2015-04-08: qty 2

## 2015-04-08 MED ORDER — FENTANYL CITRATE (PF) 100 MCG/2ML IJ SOLN
25.0000 ug | INTRAMUSCULAR | Status: DC | PRN
  Administered 2015-04-08 (×2): 50 ug via INTRAVENOUS

## 2015-04-08 MED ORDER — EPHEDRINE SULFATE 50 MG/ML IJ SOLN
INTRAMUSCULAR | Status: DC | PRN
  Administered 2015-04-08: 10 mg via INTRAVENOUS

## 2015-04-08 MED ORDER — GLYCOPYRROLATE 0.2 MG/ML IJ SOLN
INTRAMUSCULAR | Status: DC | PRN
  Administered 2015-04-08: .1 mg via INTRAVENOUS

## 2015-04-08 MED ORDER — HYDROCODONE-ACETAMINOPHEN 7.5-325 MG PO TABS
ORAL_TABLET | ORAL | Status: AC
  Filled 2015-04-08: qty 1

## 2015-04-08 MED ORDER — FENTANYL CITRATE (PF) 100 MCG/2ML IJ SOLN
INTRAMUSCULAR | Status: DC | PRN
  Administered 2015-04-08: 25 ug via INTRAVENOUS
  Administered 2015-04-08 (×2): 50 ug via INTRAVENOUS

## 2015-04-08 MED ORDER — ACETAMINOPHEN 325 MG PO TABS: 325.0000 mg | ORAL_TABLET | Freq: Every day | ORAL | Status: DC | PRN

## 2015-04-08 MED ORDER — BUPIVACAINE-EPINEPHRINE (PF) 0.25% -1:200000 IJ SOLN
INTRAMUSCULAR | Status: AC
  Filled 2015-04-08: qty 30

## 2015-04-08 MED ORDER — LACTATED RINGERS IV SOLN
INTRAVENOUS | Status: DC
  Administered 2015-04-08: 09:00:00 via INTRAVENOUS

## 2015-04-08 MED ORDER — DILTIAZEM HCL 60 MG PO TABS
30.0000 mg | ORAL_TABLET | Freq: Every day | ORAL | Status: DC
  Administered 2015-04-08: 30 mg via ORAL
  Filled 2015-04-08: qty 1

## 2015-04-08 MED ORDER — ONDANSETRON 4 MG PO TBDP: 4.0000 mg | ORAL_TABLET | Freq: Four times a day (QID) | ORAL | Status: DC | PRN

## 2015-04-08 MED ORDER — DEXAMETHASONE SODIUM PHOSPHATE 4 MG/ML IJ SOLN
INTRAMUSCULAR | Status: DC | PRN
  Administered 2015-04-08: 4 mg via INTRAVENOUS

## 2015-04-08 MED ORDER — FENTANYL CITRATE (PF) 250 MCG/5ML IJ SOLN
INTRAMUSCULAR | Status: AC
  Filled 2015-04-08: qty 5

## 2015-04-08 MED ORDER — LIDOCAINE HCL (CARDIAC) 20 MG/ML IV SOLN
INTRAVENOUS | Status: DC | PRN
  Administered 2015-04-08: 40 mg via INTRAVENOUS

## 2015-04-08 MED ORDER — PHENYLEPHRINE HCL 10 MG/ML IJ SOLN
INTRAMUSCULAR | Status: DC | PRN
  Administered 2015-04-08: 40 ug via INTRAVENOUS
  Administered 2015-04-08: 80 ug via INTRAVENOUS

## 2015-04-08 MED ORDER — MENTHOL 3 MG MT LOZG
1.0000 | LOZENGE | OROMUCOSAL | Status: DC | PRN
  Filled 2015-04-08: qty 9

## 2015-04-08 MED ORDER — 0.9 % SODIUM CHLORIDE (POUR BTL) OPTIME
TOPICAL | Status: DC | PRN
  Administered 2015-04-08: 1000 mL

## 2015-04-08 MED ORDER — ONDANSETRON HCL 4 MG/2ML IJ SOLN
INTRAMUSCULAR | Status: DC | PRN
  Administered 2015-04-08: 4 mg via INTRAVENOUS

## 2015-04-08 MED ORDER — HYDROCODONE-ACETAMINOPHEN 7.5-325 MG PO TABS
1.0000 | ORAL_TABLET | Freq: Once | ORAL | Status: AC | PRN
  Administered 2015-04-08: 1 via ORAL

## 2015-04-08 MED ORDER — PROMETHAZINE HCL 25 MG/ML IJ SOLN: 6.2500 mg | INTRAMUSCULAR | Status: DC | PRN

## 2015-04-08 MED ORDER — ACETAMINOPHEN 650 MG RE SUPP: 650.0000 mg | Freq: Four times a day (QID) | RECTAL | Status: DC | PRN

## 2015-04-08 MED ORDER — BUPIVACAINE-EPINEPHRINE 0.25% -1:200000 IJ SOLN
INTRAMUSCULAR | Status: DC | PRN
  Administered 2015-04-08: 7 mL

## 2015-04-08 SURGICAL SUPPLY — 43 items
APPLIER CLIP 9.375 MED OPEN (MISCELLANEOUS)
BINDER BREAST LRG (GAUZE/BANDAGES/DRESSINGS) IMPLANT
BINDER BREAST XLRG (GAUZE/BANDAGES/DRESSINGS) ×2 IMPLANT
BLADE SURG 10 STRL SS (BLADE) ×2 IMPLANT
BLADE SURG 15 STRL LF DISP TIS (BLADE) ×1 IMPLANT
BLADE SURG 15 STRL SS (BLADE) ×1
CANISTER SUCTION 2500CC (MISCELLANEOUS) IMPLANT
CHLORAPREP W/TINT 26ML (MISCELLANEOUS) ×2 IMPLANT
CLIP APPLIE 9.375 MED OPEN (MISCELLANEOUS) IMPLANT
COVER PROBE W GEL 5X96 (DRAPES) ×2 IMPLANT
COVER SURGICAL LIGHT HANDLE (MISCELLANEOUS) ×2 IMPLANT
DEVICE DUBIN SPECIMEN MAMMOGRA (MISCELLANEOUS) ×2 IMPLANT
DRAPE CHEST BREAST 15X10 FENES (DRAPES) ×2 IMPLANT
DRAPE UTILITY XL STRL (DRAPES) ×2 IMPLANT
ELECT COATED BLADE 2.86 ST (ELECTRODE) ×2 IMPLANT
ELECT REM PT RETURN 9FT ADLT (ELECTROSURGICAL) ×2
ELECTRODE REM PT RTRN 9FT ADLT (ELECTROSURGICAL) ×1 IMPLANT
GLOVE BIO SURGEON STRL SZ7 (GLOVE) ×4 IMPLANT
GLOVE BIOGEL PI IND STRL 7.5 (GLOVE) ×1 IMPLANT
GLOVE BIOGEL PI INDICATOR 7.5 (GLOVE) ×1
GOWN STRL REUS W/ TWL LRG LVL3 (GOWN DISPOSABLE) ×2 IMPLANT
GOWN STRL REUS W/TWL LRG LVL3 (GOWN DISPOSABLE) ×2
KIT BASIN OR (CUSTOM PROCEDURE TRAY) ×2 IMPLANT
KIT MARKER MARGIN INK (KITS) ×2 IMPLANT
LIQUID BAND (GAUZE/BANDAGES/DRESSINGS) ×2 IMPLANT
NEEDLE HYPO 25X1 1.5 SAFETY (NEEDLE) ×2 IMPLANT
NS IRRIG 1000ML POUR BTL (IV SOLUTION) ×2 IMPLANT
PACK SURGICAL SETUP 50X90 (CUSTOM PROCEDURE TRAY) ×2 IMPLANT
PEN SKIN MARKING BROAD (MISCELLANEOUS) ×2 IMPLANT
PENCIL BUTTON HOLSTER BLD 10FT (ELECTRODE) ×2 IMPLANT
SPONGE LAP 18X18 X RAY DECT (DISPOSABLE) ×2 IMPLANT
SUT MNCRL AB 4-0 PS2 18 (SUTURE) ×2 IMPLANT
SUT SILK 2 0 SH (SUTURE) ×2 IMPLANT
SUT VIC AB 2-0 SH 27 (SUTURE) ×1
SUT VIC AB 2-0 SH 27XBRD (SUTURE) ×1 IMPLANT
SUT VIC AB 3-0 SH 27 (SUTURE) ×1
SUT VIC AB 3-0 SH 27X BRD (SUTURE) ×1 IMPLANT
SYR BULB 3OZ (MISCELLANEOUS) ×2 IMPLANT
SYR CONTROL 10ML LL (SYRINGE) ×2 IMPLANT
TOWEL OR 17X24 6PK STRL BLUE (TOWEL DISPOSABLE) ×2 IMPLANT
TOWEL OR 17X26 10 PK STRL BLUE (TOWEL DISPOSABLE) ×2 IMPLANT
TUBE CONNECTING 12X1/4 (SUCTIONS) IMPLANT
YANKAUER SUCT BULB TIP NO VENT (SUCTIONS) IMPLANT

## 2015-04-08 NOTE — Interval H&P Note (Signed)
History and Physical Interval Note:  04/08/2015 8:26 AM  Terri Ponce  has presented today for surgery, with the diagnosis of LEFT BREAST CANCER  The various methods of treatment have been discussed with the patient and family. After consideration of risks, benefits and other options for treatment, the patient has consented to  Procedure(s): BREAST LUMPECTOMY WITH RADIOACTIVE SEED LOCALIZATION (Left) as a surgical intervention .  The patient's history has been reviewed, patient examined, no change in status, stable for surgery.  I have reviewed the patient's chart and labs.  Questions were answered to the patient's satisfaction.     Neisha Hinger

## 2015-04-08 NOTE — H&P (Signed)
Terri Ponce is an 81 y.o. female.   Chief Complaint: breast cancer HPI:  32 yof who I saw in March 2015 for left breast mass with skin changes. MR showed a left breast lower inner quadrant breast mass that measured 3.2x1.6x2.2 cm in size. there was some enhancement involving the pec and the skin. next to this about 1.5 cm away there was a 9x60m area of enhancement. there was a 1 cm node in the left axilla. biopsy was done and showed ILC that is er/pr positive with a low proliferation index and her2 was not amplified. [facility]. this got better and then recently on an mri this is larger. there initially was interval decrease in size and density of the mass by mm and clinically. Then she underwent another mr and this showed an enlarging area measuring 2.8x2xl7 cm in size with extension to the skin. the other area that was alh remains. the other breast and nodes are all fine. she is here with her daughter to discuss surgery  Past Medical History  Diagnosis Date  . [place]-fib ([place]   . Multiple thyroid nodules   . Blood transfusion without reported diagnosis   . Hypertension   . [place]     [place]   . Shortness of breath dyspnea     with exertion or excitement  . Pneumonia     "walking" pneumonia  . Multiple thyroid nodules   . Depression     still grieving husband's death from S10-14-2015 . Headache     hx of migraines that affects eyes   . Anemia   . Hepatitis     hepatitis A  . PONV (postoperative nausea and vomiting)     Back in 1956, none since    Past Surgical History  Procedure Laterality Date  . Ovarian cyst surgery  1956  . Intraocular lens insertion      right  . Wrist fracture surgery  July 2010    Left/titanium  . Left breast biopsy  07/16/2013  . Tonsillectomy      and adenoidectomy  . Appendectomy      Family History  Problem Relation Age of Onset  . Arrhythmia Mother     A-Fib  .  Hyperlipidemia Mother   . Hypertension Mother   . Cancer Mother     breast cancer  . Cancer Sister    Social History:  reports that she has never smoked. She has never used smokeless tobacco. She reports that she does not drink alcohol or use illicit drugs.  Allergies:  Allergies  Allergen Reactions  . Sulfonamide Derivatives Other (See Comments)    Extreme anxiety  . Other Rash    Shingles vaccine  . Amoxicillin Other (See Comments)    abdominal pain    Medications Prior to Admission  Medication Sig Dispense Refill  . anastrozole (ARIMIDEX) 1 MG tablet Take 1 tablet (1 mg total) by mouth daily. (Patient taking differently: Take 1 mg by mouth See admin instructions. Take 1 tablet (1 mg) by mouth at midnight or 1am) 90 tablet 0  . apixaban (ELIQUIS) 5 MG TABS tablet Take 1 tablet (5 mg total) by mouth 2 (two) times daily. (Patient taking differently: Take 5 mg by mouth every evening. ) 180 tablet 3  . aspirin 81 MG tablet Take 81 mg by mouth daily.    . Calcium-Magnesium 500-250 MG TABS Take 1 tablet by mouth  daily.    . Cholecalciferol (VITAMIN D3) 2000 UNITS TABS Take 2,000 Units by mouth daily.    Marland Kitchen CRANBERRY PO Take 1 tablet by mouth every other day.     . diltiazem (CARDIZEM) 30 MG tablet Take 1 tablet (30 mg total) by mouth daily. (Patient taking differently: Take 30 mg by mouth at bedtime. ) 90 tablet 3  . Multiple Vitamin (MULTIVITAMIN WITH MINERALS) TABS tablet Take 1 tablet by mouth daily.    Marland Kitchen OVER THE COUNTER MEDICATION Place 1 drop into both eyes daily. Over the counter Bausch & Lomb lubricating eye drop    . Saline 0.9 % (SOLN) SOLN Place 1 drop into both nostrils daily as needed (dry nasal passages).    Marland Kitchen acetaminophen (TYLENOL) 325 MG tablet Take 325 mg by mouth daily as needed (pain).      No results found for this or any previous visit (from the past 48 hour(s)). Mm Lt Radioactive Seed Loc Mammo Guide  04/06/2015  CLINICAL DATA:  81 year old female for  radioactive seed localization of left breast cancer prior to left lumpectomy. EXAM: MAMMOGRAPHIC GUIDED RADIOACTIVE SEED LOCALIZATION OF THE LEFT BREAST COMPARISON:  Previous exam(s). FINDINGS: Patient presents for radioactive seed localization prior to left lumpectomy. I met with the patient and we discussed the procedure of seed localization including benefits and alternatives. We discussed the high likelihood of a successful procedure. We discussed the risks of the procedure including infection, bleeding, tissue injury and further surgery. We discussed the low dose of radioactivity involved in the procedure. Informed, written consent was given. The usual time-out protocol was performed immediately prior to the procedure. Using mammographic guidance, sterile technique, 2% lidocaine and an I-125 radioactive seed, the ribbon shaped biopsy clip corresponding to biopsy-proven invasive lobular carcinoma was localized using a medial approach. The follow-up mammogram images confirm the seed in the expected location and were marked for Dr. Donne Hazel. Follow-up survey of the patient confirms presence of the radioactive seed. Order number of I-125 seed:  [id number]. Total activity:  7.322 millicuries  Reference Date: 03/22/2015 The patient tolerated the procedure well and was released from the [facility]. She was given instructions regarding seed removal. IMPRESSION: Radioactive seed localization left breast. No apparent complications. Electronically Signed   By: Margarette Canada M.D.   On: 04/06/2015 14:55    ROS General Present- Appetite Loss, Chills, Fatigue, Night Sweats and Weight Loss. Not Present- Fever and Weight Gain. Skin Present- Change in Wart/Mole and New Lesions. Not Present- Dryness, Hives, Jaundice, Non-Healing Wounds, Rash and Ulcer. HEENT Present- Hoarseness and Seasonal Allergies. Not Present- Earache, Hearing Loss, Nose Bleed, Oral Ulcers, Ringing in the Ears, Sinus Pain, Sore Throat, Visual  Disturbances, Wears glasses/contact lenses and Yellow Eyes. Breast Present- Breast Pain. Not Present- Breast Mass, Nipple Discharge and Skin Changes. Cardiovascular Present- Palpitations. Not Present- Chest Pain, Difficulty Breathing Lying Down, Leg Cramps, Rapid Heart Rate, Shortness of Breath and Swelling of Extremities. Gastrointestinal Not Present- Abdominal Pain, Bloating, Bloody Stool, Change in Bowel Habits, Chronic diarrhea, Constipation, Difficulty Swallowing, Excessive gas, Gets full quickly at meals, Hemorrhoids, Indigestion, Nausea, Rectal Pain and Vomiting. Female Genitourinary Not Present- Frequency, Nocturia, Painful Urination, Pelvic Pain and Urgency. Musculoskeletal Not Present- Back Pain, Joint Pain, Joint Stiffness, Muscle Pain, Muscle Weakness and Swelling of Extremities. Neurological Not Present- Decreased Memory, Fainting, Headaches, Numbness, Seizures, Tingling, Tremor, Trouble walking and Weakness. Psychiatric Not Present- Anxiety, Bipolar, Change in Sleep Pattern, Depression, Fearful and Frequent crying. Endocrine Present- Hot flashes. Not Present-  Cold Intolerance, Excessive Hunger, Hair Changes, Heat Intolerance and New Diabetes. Hematology Present- Easy Bruising. Not Present- Excessive bleeding, Gland problems, HIV and Persistent Infections. There were no vitals taken for this visit. Physical Exam  General Mental Status-Alert. Orientation-Oriented X3.  Chest and Lung Exam Chest and lung exam reveals -on auscultation, normal breath sounds, no adventitious sounds and normal vocal resonance.  Breast Nipples-No Discharge. 2.5 cm left breast lower inner quadrant mass  Cardiovascular Cardiovascular examination reveals -normal heart sounds, regular rate and rhythm with no murmurs.  Lymphatic Head & Neck  General Head & Neck Lymphatics: Bilateral - Description - Normal. Axillary  General Axillary Region: Bilateral - Description -  Normal.  Assessment/Plan Left breast lumpectomy I think reasonable to do surgery at this point given enlargement. This is in a position that it could cause local issues. I dont think given comorbidities, age etc that anything more would be indicated. we discussed positive margins and chance of more surgery. I told her would do under anesthesia at [facility] and keep overnight. will have pt see prior to dc to see if she has home needs.   Jefferie Holston 04/08/2015, 8:23 AM

## 2015-04-08 NOTE — Op Note (Signed)
Preoperative diagnosis: locally advanced breast cancer Postoperative diagnosis: same as above Procedure:  Left breast seed guided lumpectomy Surgeon: Dr Serita Grammes EBL: minimal Anesthesia general  Specimens  1. Left breast marked with paint 2. Additional anterior/inferior margin marked short superior, long lateral Drains none Complications none Sponge count correct dispo to pacu stable  Indications: This is a 20 yof with locally advanced left breast cancer that initially improved but now has increased in size.   She is scheduled for lumpectomy. She underwent seed placement prior to surgery. I had her mm in the OR.   Procedure: After informed consent was obtained she was taken to the or. She was given ancef and scds were in place. She was placed under general anesthesia. She was prepped and draped in the standard sterile surgical fashion. Timeout was performed.  I then located the seed. This was in the left lower inner quadrant. I made an elliptical incision to include the skin that appeared to have a close margin. after infiltrating with local anesthetic. I then removed the seed and surrounding tissue with attempt to get clear margin. The deep margin is the muscle.I did remove a little more anterior and inferior margin. I then did a mammogram which confirmed removal of the seed and clip. the prior clip from the alh biopsy was also removed.  I closed the incision with 2-0 vicryl, 3-0 vicryl and 4-0 monocryl Glue was placed. steristrips were placed. She had a breast binder applied. She was awakened and taken to recovery stable.

## 2015-04-08 NOTE — Discharge Instructions (Signed)
[facility] °Office Phone Number [phone] ° °BREAST BIOPSY/ PARTIAL MASTECTOMY: POST OP INSTRUCTIONS ° °Always review your discharge instruction sheet given to you by the facility where your surgery was performed. ° °IF YOU HAVE DISABILITY OR FAMILY LEAVE FORMS, YOU MUST BRING THEM TO THE OFFICE FOR PROCESSING.  DO NOT GIVE THEM TO YOUR DOCTOR. ° °1. A prescription for pain medication may be given to you upon discharge.  Take your pain medication as prescribed, if needed.  If narcotic pain medicine is not needed, then you may take acetaminophen (Tylenol), naprosyn (Alleve) or ibuprofen (Advil) as needed. °2. Take your usually prescribed medications unless otherwise directed °3. If you need a refill on your pain medication, please contact your pharmacy.  They will contact our office to request authorization.  Prescriptions will not be filled after 5pm or on week-ends. °4. You should eat very light the first 24 hours after surgery, such as soup, crackers, pudding, etc.  Resume your normal diet the day after surgery. °5. Most patients will experience some swelling and bruising in the breast.  Ice packs and a good support bra will help.  Wear the breast binder provided or a sports bra for 72 hours day and night.  After that wear a sports bra during the day until you return to the office. Swelling and bruising can take several days to resolve.  °6. It is common to experience some constipation if taking pain medication after surgery.  Increasing fluid intake and taking a stool softener will usually help or prevent this problem from occurring.  A mild laxative (Milk of Magnesia or Miralax) should be taken according to package directions if there are no bowel movements after 48 hours. °7. Unless discharge instructions indicate otherwise, you may remove your bandages 48 hours after surgery and you may shower at that time.  You may have steri-strips (small skin tapes) in place directly over the incision.   These strips should be left on the skin for 7-10 days and will come off on their own.  If your surgeon used skin glue on the incision, you may shower in 24 hours.  The glue will flake off over the next 2-3 weeks.  Any sutures or staples will be removed at the office during your follow-up visit. °8. ACTIVITIES:  You may resume regular daily activities (gradually increasing) beginning the next day.  Wearing a good support bra or sports bra minimizes pain and swelling.  You may have sexual intercourse when it is comfortable. °a. You may drive when you no longer are taking prescription pain medication, you can comfortably wear a seatbelt, and you can safely maneuver your car and apply brakes. °b. RETURN TO WORK:  ______________________________________________________________________________________ °9. You should see your doctor in the office for a follow-up appointment approximately two weeks after your surgery.  Your doctor’s nurse will typically make your follow-up appointment when she calls you with your pathology report.  Expect your pathology report 3-4 business days after your surgery.  You may call to check if you do not hear from us after three days. °10. OTHER INSTRUCTIONS: _______________________________________________________________________________________________ _____________________________________________________________________________________________________________________________________ °_____________________________________________________________________________________________________________________________________ °_____________________________________________________________________________________________________________________________________ ° °WHEN TO CALL DR Yashica Sterbenz: °1. Fever over 101.0 °2. Nausea and/or vomiting. °3. Extreme swelling or bruising. °4. Continued bleeding from incision. °5. Increased pain, redness, or drainage from the incision. ° °The clinic staff is available to  answer your questions during regular business hours.  Please don’t hesitate to call and ask to speak to one of the nurses for   clinical concerns.  If you have a medical emergency, go to the nearest emergency room or call 911.  A surgeon from [facility] is always on call at the hospital. ° °For further questions, please visit [email or web address] ° °

## 2015-04-08 NOTE — Anesthesia Procedure Notes (Signed)
Procedure Name: LMA Insertion Date/Time: 04/08/2015 9:48 AM Performed by: Merdis Delay Pre-anesthesia Checklist: Patient identified, Timeout performed, Emergency Drugs available, Suction available and Patient being monitored Patient Re-evaluated:Patient Re-evaluated prior to inductionOxygen Delivery Method: Circle system utilized Preoxygenation: Pre-oxygenation with 100% oxygen Intubation Type: IV induction LMA: LMA inserted LMA Size: 4.0 Number of attempts: 1 Placement Confirmation: positive ETCO2,  CO2 detector and breath sounds checked- equal and bilateral Tube secured with: Tape

## 2015-04-08 NOTE — Anesthesia Preprocedure Evaluation (Addendum)
Anesthesia Evaluation  Patient identified by MRN, date of birth, ID band Patient awake    Reviewed: Allergy & Precautions, NPO status , Patient's Chart, lab work & pertinent test results  History of Anesthesia Complications (+) PONV  Airway Mallampati: II  TM Distance: >3 FB Neck ROM: Full    Dental  (+) Dental Advisory Given   Pulmonary neg pulmonary ROS,    breath sounds clear to auscultation       Cardiovascular hypertension, Pt. on medications  Rhythm:Regular Rate:Normal     Neuro/Psych Depression negative neurological ROS     GI/Hepatic GERD  ,(+) Hepatitis -, A  Endo/Other  negative endocrine ROS  Renal/GU negative Renal ROS     Musculoskeletal   Abdominal   Peds  Hematology negative hematology ROS (+)   Anesthesia Other Findings   Reproductive/Obstetrics                            Lab Results  Component Value Date   WBC 9.3 04/05/2015   HGB 14.2 04/05/2015   HCT 43.2 04/05/2015   MCV 89.8 04/05/2015   PLT 283 04/05/2015   Lab Results  Component Value Date   CREATININE 0.81 04/05/2015   BUN 15 04/05/2015   NA 138 04/05/2015   K 4.3 04/05/2015   CL 104 04/05/2015   CO2 26 04/05/2015   Lab Results  Component Value Date   INR 1.01 04/08/2015   INR 1.02 05/29/2014    Anesthesia Physical Anesthesia Plan  ASA: II  Anesthesia Plan: General   Post-op Pain Management:    Induction: Intravenous  Airway Management Planned: LMA  Additional Equipment:   Intra-op Plan:   Post-operative Plan: Extubation in OR  Informed Consent: I have reviewed the patients History and Physical, chart, labs and discussed the procedure including the risks, benefits and alternatives for the proposed anesthesia with the patient or authorized representative who has indicated his/her understanding and acceptance.     Plan Discussed with: CRNA  Anesthesia Plan Comments:          Anesthesia Quick Evaluation

## 2015-04-08 NOTE — Transfer of Care (Signed)
Immediate Anesthesia Transfer of Care Note  Patient: Terri Ponce  Procedure(s) Performed: Procedure(s): BREAST LUMPECTOMY WITH RADIOACTIVE SEED LOCALIZATION (Left)  Patient Location: PACU  Anesthesia Type:General  Level of Consciousness: awake, alert  and oriented  Airway & Oxygen Therapy: Patient Spontanous Breathing- N/C removed and pt spo2 96% on room air   Post-op Assessment: Report given to RN and Post -op Vital signs reviewed and stable  Post vital signs: Reviewed and stable  Last Vitals: There were no vitals filed for this visit.  Complications: No apparent anesthesia complications

## 2015-04-08 NOTE — Anesthesia Postprocedure Evaluation (Signed)
  Anesthesia Post-op Note  Patient: Terri Ponce  Procedure(s) Performed: Procedure(s): BREAST LUMPECTOMY WITH RADIOACTIVE SEED LOCALIZATION (Left)  Patient Location: PACU  Anesthesia Type:General  Level of Consciousness: awake and alert   Airway and Oxygen Therapy: Patient Spontanous Breathing  Post-op Pain: mild  Post-op Assessment: Post-op Vital signs reviewed              Post-op Vital Signs: Reviewed  Last Vitals:  Filed Vitals:   04/08/15 1111  BP: 165/68  Pulse: 58  Temp: 36.4 C  Resp: 18    Complications: No apparent anesthesia complications

## 2015-04-09 ENCOUNTER — Encounter (HOSPITAL_COMMUNITY): Payer: Self-pay | Admitting: General Surgery

## 2015-04-09 DIAGNOSIS — C50312 Malignant neoplasm of lower-inner quadrant of left female breast: Secondary | ICD-10-CM | POA: Diagnosis not present

## 2015-04-09 MED ORDER — HYDROCODONE-ACETAMINOPHEN 5-325 MG PO TABS: 1.0000 | ORAL_TABLET | Freq: Four times a day (QID) | ORAL | Status: DC | PRN

## 2015-04-09 NOTE — Progress Notes (Signed)
Bonner Puna to be D/C'd  per MD order. Discussed with the patient and all questions fully answered.  VSS, Skin clean, dry and intact without evidence of skin break down, no evidence of skin tears noted.  IV catheter discontinued intact. Site without signs and symptoms of complications. Dressing and pressure applied.  An After Visit Summary was printed and given to the patient. Patient received prescription.  D/c education completed with patient/family including follow up instructions, medication list, d/c activities limitations if indicated, with other d/c instructions as indicated by MD - patient able to verbalize understanding, all questions fully answered.   Patient instructed to return to ED, call 911, or call MD for any changes in condition.   Patient to be escorted via [place], and D/C home via private auto.

## 2015-04-09 NOTE — Discharge Summary (Signed)
Physician Discharge Summary  Patient ID: Terri Ponce MRN: [id number] DOB/AGE: 1929/01/17 81 y.o.  Admit date: 04/08/2015 Discharge date: 04/09/2015  Admission Diagnoses: Cad Breast cancer  Discharge Diagnoses:  Active Problems:   Breast cancer [facility])   Discharged Condition: good  Hospital Course: 63 yof with locally advanced left breast cancer underwent lumpectomy. She has done well and is without complaint overnight.  Consults: None  Significant Diagnostic Studies: none  Treatments: surgery: left breast seed guided lumpectomy  Discharge Exam: Blood pressure 126/67, pulse 55, temperature 97.5 F (36.4 C), temperature source Oral, resp. rate 19, height 5\' 6"  (1.676 m), weight 72.122 kg (159 lb), SpO2 93 %. Incision/Wound:clean without hematoma or infection   Disposition: 01-Home or Self Care Restart eliquis tonight at home    Medication List    TAKE these medications        acetaminophen 325 MG tablet  Commonly known as:  TYLENOL  Take 325 mg by mouth daily as needed (pain).     anastrozole 1 MG tablet  Commonly known as:  ARIMIDEX  Take 1 tablet (1 mg total) by mouth daily.     apixaban 5 MG Tabs tablet  Commonly known as:  ELIQUIS  Take 1 tablet (5 mg total) by mouth 2 (two) times daily.     aspirin 81 MG tablet  Take 81 mg by mouth daily.     Calcium-Magnesium 500-250 MG Tabs  Take 1 tablet by mouth daily.     CRANBERRY PO  Take 1 tablet by mouth every other day.     diltiazem 30 MG tablet  Commonly known as:  CARDIZEM  Take 1 tablet (30 mg total) by mouth daily.     HYDROcodone-acetaminophen 5-325 MG tablet  Commonly known as:  NORCO/VICODIN  Take 1 tablet by mouth every 6 (six) hours as needed for severe pain.     multivitamin with minerals Tabs tablet  Take 1 tablet by mouth daily.     OVER THE COUNTER MEDICATION  Place 1 drop into both eyes daily. Over the counter Bausch & Lomb lubricating eye drop     Saline 0.9 % (SOLN) Soln   Place 1 drop into both nostrils daily as needed (dry nasal passages).     Vitamin D3 2000 UNITS Tabs  Take 2,000 Units by mouth daily.           Follow-up Information    Follow up with [facility], [facility] In 3 weeks.   Specialty:  General Surgery   Contact information:   [address] [place] [address] [phone]       Signed: Rolm Bookbinder 04/09/2015, 7:32 AM

## 2015-04-20 ENCOUNTER — Ambulatory Visit (HOSPITAL_BASED_OUTPATIENT_CLINIC_OR_DEPARTMENT_OTHER): Payer: Medicare PPO | Admitting: Oncology

## 2015-04-20 ENCOUNTER — Telehealth: Payer: Self-pay | Admitting: Oncology

## 2015-04-20 VITALS — BP 130/64 | HR 75 | Temp 98.4°F | Resp 18 | Ht 66.0 in | Wt 160.9 lb

## 2015-04-20 DIAGNOSIS — C50312 Malignant neoplasm of lower-inner quadrant of left female breast: Secondary | ICD-10-CM

## 2015-04-20 DIAGNOSIS — Z170 Estrogen receptor positive status [ER+]: Secondary | ICD-10-CM

## 2015-04-20 DIAGNOSIS — C50212 Malignant neoplasm of upper-inner quadrant of left female breast: Secondary | ICD-10-CM

## 2015-04-20 NOTE — Telephone Encounter (Signed)
Gave and printed appt sched and avs fo rpt for Feb 2017 °

## 2015-04-20 NOTE — Progress Notes (Signed)
[place]  [phone] [phone]     ID: Terri Ponce OB: 03-01-1929  MR#: [id number]  VQM#:[id number]  PCP: Mathews Argyle, MD GYN:   SU: Rolm Bookbinder OTHER MD: Thea Silversmith  CHIEF COMPLAINT: Locally advanced left breast cancer  CURRENT TREATMENT: Anastrozole; awaiting adjuvant radiation   BREAST CANCER HISTORY: From the original intake Notes 08/13/2013:   Terri Ponce had screening mammography December of 2012 showing a low density oval nodule at the 9:00 position which was not palpable and which by ultrasound appeared to be a cyst. 6 month reevaluation along the 11/29/2011 showed a small nodule to have entirely resolved. Return to screening mammography was recommended.  However in January of 2015 the patient noted a change in her left breast which he initially felt was due to refer her bra rubbing on her skin. She was set up for bilateral diagnostic mammography and left breast ultrasonography at the breast center 06/20/2013. There was an asymmetry in the left breast area of palpable concern in exam showed a firm raised red area of spanning 3 cm along the far medial left breast inframammary fold. Ultrasound confirmed an area of skin thickening in ill-defined change measuring 1.7 cm. This extended to the skin. It was felt to be possibly due to an infected sebaceous cyst, and the patient was given antibiotics for a week  On 07/04/2013 repeat ultrasound of the left breast showed an irregular shadowing mass at the 8:00 position measuring 1.6 cm. The left axilla showed no lymphadenopathy. Physical exam showed a firm mass in this area. Biopsy was recommended and the patient's Rivaroxaban was temporarily stopped. On 07/16/2013, biopsy of the left breast 8:00 area in question showed ([id number]) and invasive lobular carcinoma (E-cadherin negative), grade 1, estrogen receptor 100% positive, progesterone receptor 74% positive, with an MIB-1 of 16%  and no HER-2 amplification, the signals ratio being 1.29 and the number per cell 2.25.  On 07/25/2013 the patient underwent bilateral breast MRI. This showed in the lower inner quadrant of the left breast in the inframammary fold a 3.2 cm irregular enhancing mass containing a biopsy clip. Some spicules of enhancement extended posteriorly to the liver of the pectoralis. The mass abuts and appears to involve overlying skin. In addition, a 9 mm separate area of nodular enhancement was noted anterior and medial to the biopsied area. There were 2 left internal mammary lymph nodes measuring 4 mm and 2 mm. There was also a morphologically abnormal 1 cm lymph node in the left axilla. Incidental finding in the liver noted multiple masses favored to represent cysts.  On 08/04/2013 the patient underwent biopsy of the suspicious left axillary lymph node and this showed ([id number]) no evidence of malignancy. This was felt to be "possibly not concordant". Repeat MRI in 6 months (assuming neoadjuvant treatment) was recommended.  The patient has met with Dr. Donne Hazel and Dr. Pablo Ledger. Dr. Donne Hazel have set the patient up for biopsy of the satellite lesion in the left breast. She feels the patient would benefit from neoadjuvant endocrine therapy. Dr. Pablo Ledger feels the patient will need radiation, even if she eventually ends up having a mastectomy.  The patient's subsequent history is as detailed below.  INTERVAL HISTORY: "Terri Ponce" returns today for followup of her estrogen receptor positive breast cancer accompanied by her daughter [name]. Since her last visit here the patient underwent left lumpectomy without sentinel lymph node sampling, on 04/08/2015. The final pathology from this procedure (SZA 16-05/05/2002) showed multifocal invasive  lobular carcinoma, grade 1, the largest focus measuring 1.1 cm, with repeat HER-2 negative. The invasive disease was less than 0.1 mm from the inferior margin, but this was not  DCIS and cell counts as negative. No further margin clearance is planned  REVIEW OF SYSTEMS: Terri Ponce did well with the surgery, without significant pain, bleeding, fever, or other complications. She is absolutely delighted that she did so well and at the result. She is not having any bleeding problems related to the apixaban and had no complications with its interruption. She does have night sweats associated with the anastrozole but "they Ponce't wake me up". She is planning on increasing her functional status and is a ready doing yoga and "chair exercises" at the wide. She has a chronic problems with shortness of breath when climbing stairs, mild stress urinary incontinence, and occasional scrotal irritation or rash. Detailed review of systems today was otherwise stable  PAST MEDICAL HISTORY: Past Medical History  Diagnosis Date  . A-fib ([place])   . History of blood transfusion 1956    "related to the SALPINGOOPHORECTOMY"  . Hypertension   . Shortness of breath dyspnea     with exertion or excitement  . Multiple thyroid nodules     "very small; dx'd w/carotid doppler study"  . Anemia   . Hepatitis A   . PONV (postoperative nausea and vomiting) 1956    none since  . Walking pneumonia X 1    "self dx'd"  . Scintillating scotoma of right eye dx'd 02/2015  . Arthritis     "stiffness and soreness in my joints; particularly knee joints and fingers" (04/08/2015)  . Anxiety   . Depression     still grieving husband's death from 2014/03/09  . Cancer of left breast ([place]) dx'd 06/2013   possible history of TIAs  PAST SURGICAL HISTORY: Past Surgical History  Procedure Laterality Date  . Salpingoophorectomy Right 1956  . Cataract extraction w/ intraocular lens implant Right   . Wrist fracture surgery Left July 2010    titanium  . Tonsillectomy and adenoidectomy    . Appendectomy  1956  . Fracture surgery    . Breast biopsy Left 07/16/2013  . Breast lumpectomy Left 04/08/2015    WITH  RADIOACTIVE SEED LOCALIZATION   . Squamous cell carcinoma excision   09/2014    "forehead"  . Basal cell carcinoma excision   09/2014    "forehead"  . Breast lumpectomy with radioactive seed localization Left 04/08/2015    Procedure: BREAST LUMPECTOMY WITH RADIOACTIVE SEED LOCALIZATION;  Surgeon: Rolm Bookbinder, MD;  Location: [facility] OR;  Service: General;  Laterality: Left;    FAMILY HISTORY Family History  Problem Relation Age of Onset  . Arrhythmia Mother     A-Fib  . Hyperlipidemia Mother   . Hypertension Mother   . Cancer Mother     breast cancer  . Cancer Sister    the patient's father died at the age of 89, from complications of asbestosis. The patient's mother died at the age of 45. She had been diagnosed with breast cancer a few years before. The patient had no brothers, one sister. His sister died at age 32 from cancer which was metastatic but the patient does not know the primary  GYNECOLOGIC HISTORY:  Menarche age 70, first live birth age 31, the patient is [place] P2. She went through menopause at around age 67 or 59. She did not take hormone replacement. She took birth control pills for some  years without any clotting complications  SOCIAL HISTORY:  Terri Ponce has a Masters degree in public health, specifically nutrition. Her husband Merrilee Seashore was an Chief Financial Officer and he worked for [employer] division. Daughter Serin Thornell (goes by "[name]") is a Architect with the [employer].  Son lives in [place] worry works as an Associate Professor. The patient has 2 grandchildren. She is a [employer]    ADVANCED DIRECTIVES:  in place; the patient's daughter is her healthcare power of attorney   HEALTH MAINTENANCE: Social History  Substance Use Topics  . Smoking status: Never Smoker   . Smokeless tobacco: Never Used  . Alcohol Use: No     Colonoscopy: Remote  PAP: remote  Bone density: 2014; "good"   Lipid panel:  Allergies  Allergen Reactions  . Sulfonamide  Derivatives Other (See Comments)    Extreme anxiety  . Other Rash    Shingles vaccine  . Amoxicillin Other (See Comments)    abdominal pain    Current Outpatient Prescriptions  Medication Sig Dispense Refill  . acetaminophen (TYLENOL) 325 MG tablet Take 325 mg by mouth daily as needed (pain).    Marland Kitchen anastrozole (ARIMIDEX) 1 MG tablet Take 1 tablet (1 mg total) by mouth daily. (Patient taking differently: Take 1 mg by mouth See admin instructions. Take 1 tablet (1 mg) by mouth at midnight or 1am) 90 tablet 0  . apixaban (ELIQUIS) 5 MG TABS tablet Take 1 tablet (5 mg total) by mouth 2 (two) times daily. (Patient taking differently: Take 5 mg by mouth every evening. ) 180 tablet 3  . aspirin 81 MG tablet Take 81 mg by mouth daily.    . Calcium-Magnesium 500-250 MG TABS Take 1 tablet by mouth daily.    . Cholecalciferol (VITAMIN D3) 2000 UNITS TABS Take 2,000 Units by mouth daily.    Marland Kitchen CRANBERRY PO Take 1 tablet by mouth every other day.     . diltiazem (CARDIZEM) 30 MG tablet Take 1 tablet (30 mg total) by mouth daily. (Patient taking differently: Take 30 mg by mouth at bedtime. ) 90 tablet 3  . HYDROcodone-acetaminophen (NORCO/VICODIN) 5-325 MG tablet Take 1 tablet by mouth every 6 (six) hours as needed for severe pain. 10 tablet 0  . Multiple Vitamin (MULTIVITAMIN WITH MINERALS) TABS tablet Take 1 tablet by mouth daily.    Marland Kitchen OVER THE COUNTER MEDICATION Place 1 drop into both eyes daily. Over the counter [employer] lubricating eye drop    . Saline 0.9 % (SOLN) SOLN Place 1 drop into both nostrils daily as needed (dry nasal passages).     No current facility-administered medications for this visit.    OBSERVATION: Elderly white woman who appears stated age 81 Vitals:   04/20/15 1517  BP: 130/64  Pulse: 75  Temp: 98.4 F (36.9 C)  Resp: 18     Body mass index is 25.98 kg/(m^2).    ECOG FS:1 - Symptomatic but completely ambulatory  Sclerae unicteric, EOMs intact Oropharynx clear,  slightly dry No cervical or supraclavicular adenopathy Lungs no rales or rhonchi Heart regular rate and rhythm Abd soft, nontender, positive bowel sounds MSK no focal spinal tenderness, no upper extremity lymphedema Neuro: nonfocal, well oriented, appropriate affect Breasts: The right breast is unremarkable. The left breast is status post recent surgery. The cosmetic result is excellent. There is no evidence of dehiscence, swelling, or erythema. The left axilla is benign.   LAB RESULTS:  CMP     Component Value  Date/Time   NA 138 04/05/2015 1305   NA 142 02/02/2015 1438   NA 139 03/09/2014 1625    I No results found for: SPEP  Lab Results  Component Value Date   WBC 9.3 04/05/2015      Chemistry      Component Value Date/Time   NA 138 04/05/2015 1305   NA 142 02/02/2015 1438   NA 139 03/09/2014 1625      Component Value Date/Time   CALCIUM 10.5* 04/05/2015 1305   CALCIUM 10.3 02/02/2015 1438   CALCIUM 8.8 03/09/2014 1625       No results found for: LABCA2  No components found for: LABCA125  No results for input(s): INR in the last 168 hours.  Urinalysis No results found for: COLORURINE  STUDIES: Mm Breast Surgical Specimen  04/08/2015  CLINICAL DATA:  Status post excision of a breast lesion EXAM: SPECIMEN RADIOGRAPH OF THE LEFT BREAST COMPARISON:  Previous exam(s). FINDINGS: Status post excision of the left breast. The radioactive seed and 2 biopsy marker clips are present, completely intact, and were marked for pathology. IMPRESSION: Specimen radiograph of the left breast. Electronically Signed   By: Lajean Manes M.D.   On: 04/08/2015 10:06   Mm Lt Radioactive Seed Loc Mammo Guide  04/06/2015  CLINICAL DATA:  81 year old female for radioactive seed localization of left breast cancer prior to left lumpectomy. EXAM: MAMMOGRAPHIC GUIDED RADIOACTIVE SEED LOCALIZATION OF THE LEFT BREAST COMPARISON:  Previous exam(s). FINDINGS: Patient presents for radioactive  seed localization prior to left lumpectomy. I met with the patient and we discussed the procedure of seed localization including benefits and alternatives. We discussed the high likelihood of a successful procedure. We discussed the risks of the procedure including infection, bleeding, tissue injury and further surgery. We discussed the low dose of radioactivity involved in the procedure. Informed, written consent was given. The usual time-out protocol was performed immediately prior to the procedure. Using mammographic guidance, sterile technique, 2% lidocaine and an I-125 radioactive seed, the ribbon shaped biopsy clip corresponding to biopsy-proven invasive lobular carcinoma was localized using a medial approach. The follow-up mammogram images confirm the seed in the expected location and were marked for Dr. Donne Hazel. Follow-up survey of the patient confirms presence of the radioactive seed. Order number of I-125 seed:  [id number]. Total activity:  0.973 millicuries  Reference Date: 03/22/2015 The patient tolerated the procedure well and was released from the [facility]. She was given instructions regarding seed removal. IMPRESSION: Radioactive seed localization left breast. No apparent complications. Electronically Signed   By: Margarette Canada M.D.   On: 04/06/2015 14:55    ASSESSMENT: 82 y.o. [place] woman status post left breast biopsy 07/16/2013 for a clinically multifocal T2 N0-3 (stage IIA-IIIC) invasive lobular breast cancer, estrogen receptor 100% positive, progesterone receptor 74% positive, with an MIB-1 of 16% and no HER-2 amplification  (1) biopsy of a suspicious left axillary lymph node 08/04/2013 was negative, but possibly discordant  (2) the patient has 2 suspicious left internal mammary lymph nodes as well as some indeterminate liver lesions which were negative on PET scan 08/26/2013 and by repeat breast MRI are c/w cysts  (3) MRI biopsy of a satellite lesion in the left breast  08/15/2013 showed atypical lobular hyperplasia.  (4) anastrozole started 08/13/2013  (a) breast MRI 01/20/2014 shows near complete resolution of the measurable mass  (b) breast MRI 01/27/2015 shows evidence of progression  (5) DEXA scan 03/05/2013 at [facility] showed osteoporosis with the lowest T  score at -2.7  (6) left lumpectomy 04/08/2015 showed a pT1c pN0, stage IA invasive ductal carcinoma, grade 1, repeat HER-2 again negative.  (7) adjuvant radiation pending   PLAN: Terri Ponce is euphoric at having done so well with her surgery. This pretty much shows the extent of her original concern.  She continues to tolerate the anastrozole well, and the plan will be to continue that for a total of 5 years. Her DEXA scan 03/05/2013 showed osteoporosis (T score -2.7) and once she completes her radiation treatments and recovers from that we will consider adding Denosumab/prolia after obtaining a new baseline scan.  I have put her in for an appointment with Dr. Pablo Ledger within the next couple of weeks. She will return to see me late February assuming all goes well, but she will call with any problems that may develop.  Chauncey Cruel, MD   04/20/2015 3:34 PM

## 2015-04-22 NOTE — Progress Notes (Addendum)
Location of Breast Cancer: Left breast, lower inner quadrant. Invasive lobular carcinoma  Histology per Pathology Report 04/08/15   07/16/2013 Previous Pathology Diagnosis Breast, left, needle core biopsy, inner, 8 o'clock 9 cm from nipple, at inframammary fold - INVASIVE MAMMARY CARCINOMA. - MAMMARY CARCINOMA IN SITU. - SEE COMMENT.  08/04/2013 Microscopic Comment Diagnosis Lymph node, needle/core biopsy, Level I - ONE LYMPH NODE, NEGATIVE FOR TUMOR (0/1). SEE COMMENT. Microscopic Comment  Receptor Status: ER(+), PR (+), Her2-neu (-)  Did patient present with symptoms (if so, please note symptoms) or was this found on screening mammography?:Patient noted left breast soreness and mass near inframammary fold.  Past/Anticipated interventions by surgeon, if any: Left lumpectomy without sentinel lymph node sampling 04/08/15  Past/Anticipated interventions by medical oncology, if any: [place] 04/20/15:   Lymphedema issues, if any:No  Pain issues, if AES:[id number]  SAFETY ISSUES:  Prior radiation? No  Pacemaker/ICD?No  Possible current pregnancy?No  Is the patient on methotrexate?No  Current Complaints / other details: Patient here with daughter [name]. Menarche age 88, first full term birth age 66.No HRT.1 son and 1 daughter.Patient is caretaker of 66 year husband.she is very alert and relatively healthy except for atrial fibrillation.In the process of relocating from [place] to [place].

## 2015-04-28 ENCOUNTER — Ambulatory Visit
Admission: RE | Admit: 2015-04-28 | Discharge: 2015-04-28 | Disposition: A | Discharge status: Home / Self Care | Payer: Medicare PPO | Source: Ambulatory Visit | Attending: Radiation Oncology | Admitting: Radiation Oncology

## 2015-04-28 ENCOUNTER — Encounter: Payer: Self-pay | Admitting: Radiation Oncology

## 2015-04-28 VITALS — BP 122/67 | HR 70 | Temp 97.9°F | Ht 66.0 in | Wt 161.7 lb

## 2015-04-28 DIAGNOSIS — Z170 Estrogen receptor positive status [ER+]: Secondary | ICD-10-CM | POA: Diagnosis not present

## 2015-04-28 DIAGNOSIS — C50912 Malignant neoplasm of unspecified site of left female breast: Secondary | ICD-10-CM | POA: Insufficient documentation

## 2015-04-28 DIAGNOSIS — C50212 Malignant neoplasm of upper-inner quadrant of left female breast: Secondary | ICD-10-CM

## 2015-04-28 DIAGNOSIS — Z510 Encounter for antineoplastic radiation therapy: Secondary | ICD-10-CM | POA: Insufficient documentation

## 2015-04-28 DIAGNOSIS — C50312 Malignant neoplasm of lower-inner quadrant of left female breast: Secondary | ICD-10-CM

## 2015-04-28 NOTE — Progress Notes (Signed)
Terri Ponce here today for F/U recon.   Reports that left breast is healing well.  Energy level gets less in the afternoon, said she tires easy.  Appetite is good.  Sees her surgeon today to recheck her left breast . BP 122/67 mmHg  Pulse 70  Temp(Src) 97.9 F (36.6 C) (Oral)  Ht 5\' 6"  (1.676 m)  Wt 161 lb 11.2 oz (73.347 kg)  BMI 26.11 kg/m2  SpO2 95%  Wt Readings from Last 3 Encounters:  04/28/15 161 lb 11.2 oz (73.347 kg)  04/20/15 160 lb 14.4 oz (72.984 kg)  04/08/15 159 lb (72.122 kg)

## 2015-04-28 NOTE — Progress Notes (Signed)
   Department of Radiation Oncology  Phone:  [phone] Fax:        [phone]   Name: Terri Ponce MRN: [id number]  DOB: [birth year]  Date: 04/28/2015  Follow Up Visit Note  Diagnosis: T1cN0 Invasive Ductal Carcinoma of the Left Breast  Interval History: [place] presents today for routine followup. I saw Ms. Terri Ponce in March 2015. At that time, she was caring for her husband and elected to proceed with anti-estrogen therapy alone. The mass measured 3.2 cm at that time with enhancement involving the pectoralis muscle and skin. A lymph node was biopsied and was negative at that time. She underwent neoadjuvant anti-estrogen therapy and initially this mass responded well. An MRI in Sept showed that the mass has enlarged. She elected to proceed with surgery and had a lumpectomy on 04/08/15. This showed multifocal invasive lobular carcinoma. The margins were focally positive medially (at the sternum per Dr. Donne Hazel). No lymphovascular invasion. The tumor did involve the skin. No lymph nodes were sampled. A prognostic profile was not repeated. She has recovered well from her surgery. She has met with Dr. Jana Hakim and will continue on Anastrozole after radiation.  The patient presents to the clinic with her daughter. She reports that the left breast is healing well. Her energy level lessens in the afternoon and she tires easy. She reports a good appetite. She will see her surgeon today to recheck her left breast. Her husband has since passed from the last I saw her.  Physical Exam:  Filed Vitals:   04/28/15 0845  BP: 122/67  Pulse: 70  Temp: 97.9 F (36.6 C)  TempSrc: Oral  Height: _0  (1.676 m)  Weight: 161 lb 11.2 oz (73.347 kg)  SpO2: 95%  Alert and oriented x3. In no distress. Healing incision in the lower inner quadrant. Steristrips in place. No evidence of infection.   IMPRESSION: Terri Ponce is a 81 y.o. female with T1cN0 Invasive Ductal Carcinoma of the Left Breast.  PLAN: I spoke  to the patient today regarding her diagnosis and options for treatment. We discussed the equivalence in terms of survival and local failure between mastectomy and breast conservation. We discussed the role of radiation in decreasing local failures in patients who undergo lumpectomy. We discussed the process of simulation and the placement tattoos. We discussed 4 weeks of treatment as an outpatient to the breast and a boost. We discussed the possibility of asymptomatic lung damage. We discussed the low likelihood of secondary malignancies. We discussed the possible side effects including but not limited to skin redness, fatigue, permanent skin darkening, and breast swelling.  Informed consent was signed and CT simulation has been scheduled on 05/18/15 at [place], [place]  This document serves as a record of services personally performed by Thea Silversmith, MD. It was created on her behalf by Darcus Austin, a trained medical scribe. The creation of this record is based on the scribe's personal observations and the provider's statements to them. This document has been checked and approved by the attending provider.

## 2015-05-06 ENCOUNTER — Other Ambulatory Visit: Payer: Self-pay | Admitting: Geriatric Medicine

## 2015-05-06 DIAGNOSIS — Z0000 Encounter for general adult medical examination without abnormal findings: Secondary | ICD-10-CM | POA: Diagnosis not present

## 2015-05-06 DIAGNOSIS — E7800 Pure hypercholesterolemia, unspecified: Secondary | ICD-10-CM | POA: Diagnosis not present

## 2015-05-06 DIAGNOSIS — Z1389 Encounter for screening for other disorder: Secondary | ICD-10-CM | POA: Diagnosis not present

## 2015-05-06 DIAGNOSIS — Z23 Encounter for immunization: Secondary | ICD-10-CM | POA: Diagnosis not present

## 2015-05-06 DIAGNOSIS — E041 Nontoxic single thyroid nodule: Secondary | ICD-10-CM | POA: Diagnosis not present

## 2015-05-06 DIAGNOSIS — M810 Age-related osteoporosis without current pathological fracture: Secondary | ICD-10-CM | POA: Diagnosis not present

## 2015-05-06 DIAGNOSIS — Z7189 Other specified counseling: Secondary | ICD-10-CM | POA: Diagnosis not present

## 2015-05-06 DIAGNOSIS — I482 Chronic atrial fibrillation: Secondary | ICD-10-CM | POA: Diagnosis not present

## 2015-05-10 DIAGNOSIS — M810 Age-related osteoporosis without current pathological fracture: Secondary | ICD-10-CM | POA: Diagnosis not present

## 2015-05-12 ENCOUNTER — Ambulatory Visit
Admission: RE | Admit: 2015-05-12 | Discharge: 2015-05-12 | Disposition: A | Discharge status: Home / Self Care | Payer: Medicare PPO | Source: Ambulatory Visit | Attending: Geriatric Medicine | Admitting: Geriatric Medicine

## 2015-05-12 DIAGNOSIS — E042 Nontoxic multinodular goiter: Secondary | ICD-10-CM | POA: Diagnosis not present

## 2015-05-12 DIAGNOSIS — E041 Nontoxic single thyroid nodule: Secondary | ICD-10-CM

## 2015-05-18 ENCOUNTER — Ambulatory Visit
Admission: RE | Admit: 2015-05-18 | Discharge: 2015-05-18 | Disposition: A | Discharge status: Home / Self Care | Payer: Medicare PPO | Source: Ambulatory Visit | Attending: Radiation Oncology | Admitting: Radiation Oncology

## 2015-05-18 DIAGNOSIS — Z510 Encounter for antineoplastic radiation therapy: Secondary | ICD-10-CM | POA: Diagnosis not present

## 2015-05-18 DIAGNOSIS — C50312 Malignant neoplasm of lower-inner quadrant of left female breast: Secondary | ICD-10-CM | POA: Diagnosis not present

## 2015-05-18 DIAGNOSIS — C50912 Malignant neoplasm of unspecified site of left female breast: Secondary | ICD-10-CM | POA: Diagnosis not present

## 2015-05-18 DIAGNOSIS — Z170 Estrogen receptor positive status [ER+]: Secondary | ICD-10-CM | POA: Diagnosis not present

## 2015-05-18 DIAGNOSIS — C50212 Malignant neoplasm of upper-inner quadrant of left female breast: Secondary | ICD-10-CM | POA: Diagnosis not present

## 2015-05-18 NOTE — Progress Notes (Signed)
Radiation Oncology         [phone] ________________________________  Name: Terri Ponce      MRN: [id number]          Date: 05/18/2015              DOB: March 18, 1929  Optical Surface Tracking Plan:  Since intensity modulated radiotherapy (IMRT) and 3D conformal radiation treatment methods are predicated on accurate and precise positioning for treatment, intrafraction motion monitoring is medically necessary to ensure accurate and safe treatment delivery.  The ability to quantify intrafraction motion without excessive ionizing radiation dose can only be performed with optical surface tracking. Accordingly, surface imaging offers the opportunity to obtain 3D measurements of patient position throughout IMRT and 3D treatments without excessive radiation exposure.  I am ordering optical surface tracking for this patient's upcoming course of radiotherapy. ________________________________ Signature   Reference:   Ursula Alert, J, et al. Surface imaging-based analysis of intrafraction motion for breast radiotherapy patients.Journal of [place], n. 6, nov. 2014. ISSN [id number].   Available at: <http://www.jacmp.org/index.php/jacmp/article/view/4957>.

## 2015-05-18 NOTE — Progress Notes (Signed)
Name: Terri Ponce   MRN: [id number]  Date:  05/18/2015  DOB: [birth year]  Status:outpatient    DIAGNOSIS: Breast cancer.  CONSENT VERIFIED: yes   SET UP: Patient is setup supine   IMMOBILIZATION:  The following immobilization was used:Custom Moldable Pillow, breast board.   NARRATIVE: [name] was brought to the [facility].  Identity was confirmed.  All relevant records and images related to the planned course of therapy were reviewed.  Then, the patient was positioned in a stable reproducible clinical set-up for radiation therapy.  Wires were placed to delineate the clinical extent of breast tissue. A wire was placed on the scar as well.  CT images were obtained.  An isocenter was placed. Skin markings were placed.  The CT images were loaded into the planning software where the target and avoidance structures were contoured.  The radiation prescription was entered and confirmed. The patient was discharged in stable condition and tolerated simulation well.    TREATMENT PLANNING NOTE:  Treatment planning then occurred. I have requested : MLC's, isodose plan, basic dose calculation  I personally designed and supervised the construction of 3 medically necessary complex treatment devices for the protection of critical normal structures including the lungs and contralateral breast as well as the immobilization device which is necessary for set up certainty.   3D simulation occurred. I requested and analyzed a dose volume histogram of the heart, lungs and lumpectomy cavity.  This document serves as a record of services personally performed by Thea Silversmith, MD. It was created on her behalf by Darcus Austin, a trained medical scribe. The creation of this record is based on the scribe's personal observations and the provider's statements to them. This document has been checked and approved by the attending provider.

## 2015-05-19 ENCOUNTER — Other Ambulatory Visit: Payer: Self-pay | Admitting: Geriatric Medicine

## 2015-05-19 DIAGNOSIS — E041 Nontoxic single thyroid nodule: Secondary | ICD-10-CM

## 2015-05-25 DIAGNOSIS — C50212 Malignant neoplasm of upper-inner quadrant of left female breast: Secondary | ICD-10-CM | POA: Diagnosis not present

## 2015-05-25 DIAGNOSIS — C50312 Malignant neoplasm of lower-inner quadrant of left female breast: Secondary | ICD-10-CM | POA: Diagnosis not present

## 2015-05-25 DIAGNOSIS — Z510 Encounter for antineoplastic radiation therapy: Secondary | ICD-10-CM | POA: Diagnosis not present

## 2015-05-25 DIAGNOSIS — C50912 Malignant neoplasm of unspecified site of left female breast: Secondary | ICD-10-CM | POA: Diagnosis not present

## 2015-05-25 DIAGNOSIS — Z170 Estrogen receptor positive status [ER+]: Secondary | ICD-10-CM | POA: Diagnosis not present

## 2015-05-26 ENCOUNTER — Ambulatory Visit
Admission: RE | Admit: 2015-05-26 | Discharge: 2015-05-26 | Disposition: A | Discharge status: Home / Self Care | Payer: Medicare PPO | Source: Ambulatory Visit | Attending: Radiation Oncology | Admitting: Radiation Oncology

## 2015-05-26 DIAGNOSIS — Z510 Encounter for antineoplastic radiation therapy: Secondary | ICD-10-CM | POA: Diagnosis not present

## 2015-05-26 DIAGNOSIS — C50312 Malignant neoplasm of lower-inner quadrant of left female breast: Secondary | ICD-10-CM | POA: Diagnosis not present

## 2015-05-26 DIAGNOSIS — C50912 Malignant neoplasm of unspecified site of left female breast: Secondary | ICD-10-CM | POA: Diagnosis not present

## 2015-05-26 DIAGNOSIS — C50212 Malignant neoplasm of upper-inner quadrant of left female breast: Secondary | ICD-10-CM | POA: Diagnosis not present

## 2015-05-26 DIAGNOSIS — Z170 Estrogen receptor positive status [ER+]: Secondary | ICD-10-CM | POA: Diagnosis not present

## 2015-05-27 ENCOUNTER — Ambulatory Visit
Admission: RE | Admit: 2015-05-27 | Discharge: 2015-05-27 | Disposition: A | Discharge status: Home / Self Care | Payer: Medicare PPO | Source: Ambulatory Visit | Attending: Radiation Oncology | Admitting: Radiation Oncology

## 2015-05-27 DIAGNOSIS — Z170 Estrogen receptor positive status [ER+]: Secondary | ICD-10-CM | POA: Diagnosis not present

## 2015-05-27 DIAGNOSIS — C50312 Malignant neoplasm of lower-inner quadrant of left female breast: Secondary | ICD-10-CM | POA: Diagnosis not present

## 2015-05-27 DIAGNOSIS — C50912 Malignant neoplasm of unspecified site of left female breast: Secondary | ICD-10-CM | POA: Diagnosis not present

## 2015-05-27 DIAGNOSIS — Z510 Encounter for antineoplastic radiation therapy: Secondary | ICD-10-CM | POA: Diagnosis not present

## 2015-05-27 DIAGNOSIS — C50212 Malignant neoplasm of upper-inner quadrant of left female breast: Secondary | ICD-10-CM | POA: Diagnosis not present

## 2015-05-28 ENCOUNTER — Ambulatory Visit
Admission: RE | Admit: 2015-05-28 | Discharge: 2015-05-28 | Disposition: A | Discharge status: Home / Self Care | Payer: Medicare PPO | Source: Ambulatory Visit | Attending: Radiation Oncology | Admitting: Radiation Oncology

## 2015-05-28 DIAGNOSIS — C50312 Malignant neoplasm of lower-inner quadrant of left female breast: Secondary | ICD-10-CM | POA: Diagnosis not present

## 2015-05-28 DIAGNOSIS — C50912 Malignant neoplasm of unspecified site of left female breast: Secondary | ICD-10-CM | POA: Diagnosis not present

## 2015-05-28 DIAGNOSIS — Z510 Encounter for antineoplastic radiation therapy: Secondary | ICD-10-CM | POA: Diagnosis not present

## 2015-05-28 DIAGNOSIS — Z170 Estrogen receptor positive status [ER+]: Secondary | ICD-10-CM | POA: Diagnosis not present

## 2015-05-28 DIAGNOSIS — C50212 Malignant neoplasm of upper-inner quadrant of left female breast: Secondary | ICD-10-CM | POA: Diagnosis not present

## 2015-05-31 ENCOUNTER — Encounter: Payer: Self-pay | Admitting: Radiation Oncology

## 2015-05-31 ENCOUNTER — Ambulatory Visit
Admission: RE | Admit: 2015-05-31 | Discharge: 2015-05-31 | Disposition: A | Discharge status: Home / Self Care | Payer: Medicare PPO | Source: Ambulatory Visit | Attending: Radiation Oncology | Admitting: Radiation Oncology

## 2015-05-31 VITALS — BP 114/66 | HR 68 | Temp 98.0°F | Resp 12 | Wt 162.5 lb

## 2015-05-31 DIAGNOSIS — C50312 Malignant neoplasm of lower-inner quadrant of left female breast: Secondary | ICD-10-CM | POA: Diagnosis not present

## 2015-05-31 DIAGNOSIS — Z510 Encounter for antineoplastic radiation therapy: Secondary | ICD-10-CM | POA: Diagnosis not present

## 2015-05-31 DIAGNOSIS — C50212 Malignant neoplasm of upper-inner quadrant of left female breast: Secondary | ICD-10-CM | POA: Diagnosis not present

## 2015-05-31 DIAGNOSIS — C50912 Malignant neoplasm of unspecified site of left female breast: Secondary | ICD-10-CM | POA: Diagnosis not present

## 2015-05-31 DIAGNOSIS — Z170 Estrogen receptor positive status [ER+]: Secondary | ICD-10-CM | POA: Diagnosis not present

## 2015-05-31 DIAGNOSIS — E0789 Other specified disorders of thyroid: Secondary | ICD-10-CM | POA: Diagnosis not present

## 2015-05-31 DIAGNOSIS — E041 Nontoxic single thyroid nodule: Secondary | ICD-10-CM | POA: Diagnosis not present

## 2015-05-31 MED ORDER — RADIAPLEXRX EX GEL
Freq: Once | CUTANEOUS | Status: AC
  Administered 2015-05-31: 17:00:00 via TOPICAL

## 2015-05-31 MED ORDER — ALRA NON-METALLIC DEODORANT (RAD-ONC)
1.0000 | Freq: Once | TOPICAL | Status: AC
Start: 2015-05-31 — End: 2015-05-31
  Administered 2015-05-31: 1 via TOPICAL

## 2015-05-31 NOTE — Progress Notes (Signed)
   Weekly Management Note:  Outpatient    ICD-9-CM ICD-10-CM   1. Breast cancer of upper-inner quadrant of left female breast (HCC) 174.2 C50.212     Current Dose:  8.01 Gy  Projected Dose: 52.72 Gy   Narrative:  The patient presents for routine under treatment assessment.  CBCT/MVCT images/Port film x-rays were reviewed.  The chart was checked. No complaints   Physical Findings:  weight is 162 lb 8 oz (73.71 kg). Her oral temperature is 98 F (36.7 C). Her blood pressure is 114/66 and her pulse is 68. Her respiration is 12 and oxygen saturation is 98%.   Wt Readings from Last 3 Encounters:  05/31/15 162 lb 8 oz (73.71 kg)  04/28/15 161 lb 11.2 oz (73.347 kg)  04/20/15 160 lb 14.4 oz (72.984 kg)   NAD, no skin irritation in RT fields  Impression:  The patient is tolerating radiotherapy.  Plan:  Continue radiotherapy as planned.    ________________________________   Eppie Gibson, M.D.

## 2015-05-31 NOTE — Progress Notes (Signed)
PAIN: She is currently in no pain. Occasionally will have "fleeting" pain to left breast.  SKIN: Pt left breast- warm dry and intact.  Pt denies edema.   OTHER: Pt complains of fatigue. Biopsy on Thyroid tomorrow, she stopped her blood thinner.  Pt will have Patient education today.  BP 114/66 mmHg  Pulse 68  Temp(Src) 98 F (36.7 C) (Oral)  Resp 12  Wt 162 lb 8 oz (73.71 kg)  SpO2 98% Wt Readings from Last 3 Encounters:  05/31/15 162 lb 8 oz (73.71 kg)  04/28/15 161 lb 11.2 oz (73.347 kg)  04/20/15 160 lb 14.4 oz (72.984 kg)

## 2015-05-31 NOTE — Addendum Note (Signed)
Encounter addended by: Jenene Slicker, RN on: 05/31/2015  4:34 PM<BR>     Documentation filed: Inpatient MAR

## 2015-05-31 NOTE — Addendum Note (Signed)
Encounter addended by: Jenene Slicker, RN on: 05/31/2015  4:35 PM<BR>     Documentation filed: Inpatient Patient Education

## 2015-05-31 NOTE — Progress Notes (Addendum)
RADIATION TREATMENT  SKIN CARE-BREAST    RECOMMENDATIONS: ? Use unscented soap (Dove) ? When showering it is fine for water to touch the area, but please avoid direct spray on the treatment field if skin becomes irritated.  Also, wash inside and around the marked area ? When drying gently blot the area  ? PLEASE DO NOT APPLY ANY OTHER CREAMS, LOTIONS, POWDERS, PERFUMES, OILS OR ALCOHOL PRODUCTS (OTHER THAN WHAT IS GIVEN TO YOU BY THE RADIATION TEAM) TO THE TREATMENT AREA DURING RADIATION THERAPY   SKIN CARE: ? Moisturizer o You will be given (Radiaplex Gel) to use. Apply twice daily, once after treatment and then again prior to bedtime o Your Radiation Oncologist may suggest other skin care products as needed  DEODORANT:  ? Nursing will provide deodorant Jethro Poling) to be used during your radiation treatment  Deodorant Alternatives: ? A combination of equal amounts of baking soda and corn starch.  Mix together and powder puff on ? Toms of [place]-available at [employer] ([facility] [place]) ? [place] Crystal Deodorant Mist-100% Natural Mineral Salts and Purified water ([facility] [place])   PLEASE DO NOT APPLY THE RADIAPLEX GEL OR ALRA DEODORANT WITHIN 4 HOURS PRIOR TO RADIATION TREATMENT     Http://rtanswers.org/treatmentinformation/whattoexpect/index

## 2015-05-31 NOTE — Addendum Note (Signed)
Encounter addended by: Jenene Slicker, RN on: 05/31/2015  4:32 PM<BR>     Documentation filed: [employer]x [employer], Orders

## 2015-06-01 ENCOUNTER — Ambulatory Visit
Admission: RE | Admit: 2015-06-01 | Discharge: 2015-06-01 | Disposition: A | Discharge status: Home / Self Care | Payer: Medicare PPO | Source: Ambulatory Visit | Attending: Radiation Oncology | Admitting: Radiation Oncology

## 2015-06-01 ENCOUNTER — Encounter: Payer: Self-pay | Admitting: Radiation Oncology

## 2015-06-01 ENCOUNTER — Ambulatory Visit
Admission: RE | Admit: 2015-06-01 | Discharge: 2015-06-01 | Disposition: A | Discharge status: Home / Self Care | Payer: Medicare PPO | Source: Ambulatory Visit | Attending: Geriatric Medicine | Admitting: Geriatric Medicine

## 2015-06-01 ENCOUNTER — Other Ambulatory Visit (HOSPITAL_COMMUNITY)
Admission: RE | Admit: 2015-06-01 | Discharge: 2015-06-01 | Disposition: A | Discharge status: Home / Self Care | Payer: Medicare PPO | Source: Ambulatory Visit | Attending: Physician Assistant | Admitting: Physician Assistant

## 2015-06-01 DIAGNOSIS — E041 Nontoxic single thyroid nodule: Secondary | ICD-10-CM | POA: Insufficient documentation

## 2015-06-01 DIAGNOSIS — E0789 Other specified disorders of thyroid: Secondary | ICD-10-CM | POA: Diagnosis not present

## 2015-06-01 DIAGNOSIS — C50312 Malignant neoplasm of lower-inner quadrant of left female breast: Secondary | ICD-10-CM | POA: Diagnosis not present

## 2015-06-01 DIAGNOSIS — C50212 Malignant neoplasm of upper-inner quadrant of left female breast: Secondary | ICD-10-CM | POA: Diagnosis not present

## 2015-06-01 DIAGNOSIS — Z170 Estrogen receptor positive status [ER+]: Secondary | ICD-10-CM | POA: Diagnosis not present

## 2015-06-01 DIAGNOSIS — Z510 Encounter for antineoplastic radiation therapy: Secondary | ICD-10-CM | POA: Diagnosis not present

## 2015-06-01 DIAGNOSIS — C50912 Malignant neoplasm of unspecified site of left female breast: Secondary | ICD-10-CM | POA: Diagnosis not present

## 2015-06-01 NOTE — Procedures (Signed)
Using direct ultrasound guidance, 4 passes were made using needles into the nodule within the right lobe of the thyroid.   Ultrasound was used to confirm needle placements on all occasions.   Specimens were sent to Pathology for analysis.   Denny Lave S Conception Doebler PA-C 03/16/2015 1:43 PM

## 2015-06-02 ENCOUNTER — Ambulatory Visit
Admission: RE | Admit: 2015-06-02 | Discharge: 2015-06-02 | Disposition: A | Discharge status: Home / Self Care | Payer: Medicare PPO | Source: Ambulatory Visit | Attending: Radiation Oncology | Admitting: Radiation Oncology

## 2015-06-02 DIAGNOSIS — C50312 Malignant neoplasm of lower-inner quadrant of left female breast: Secondary | ICD-10-CM | POA: Diagnosis not present

## 2015-06-02 DIAGNOSIS — Z170 Estrogen receptor positive status [ER+]: Secondary | ICD-10-CM | POA: Diagnosis not present

## 2015-06-02 DIAGNOSIS — C50912 Malignant neoplasm of unspecified site of left female breast: Secondary | ICD-10-CM | POA: Diagnosis not present

## 2015-06-02 DIAGNOSIS — Z510 Encounter for antineoplastic radiation therapy: Secondary | ICD-10-CM | POA: Diagnosis not present

## 2015-06-02 DIAGNOSIS — C50212 Malignant neoplasm of upper-inner quadrant of left female breast: Secondary | ICD-10-CM | POA: Diagnosis not present

## 2015-06-03 ENCOUNTER — Ambulatory Visit
Admission: RE | Admit: 2015-06-03 | Discharge: 2015-06-03 | Disposition: A | Discharge status: Home / Self Care | Payer: Medicare PPO | Source: Ambulatory Visit | Attending: Radiation Oncology | Admitting: Radiation Oncology

## 2015-06-03 DIAGNOSIS — C50212 Malignant neoplasm of upper-inner quadrant of left female breast: Secondary | ICD-10-CM | POA: Diagnosis not present

## 2015-06-03 DIAGNOSIS — Z170 Estrogen receptor positive status [ER+]: Secondary | ICD-10-CM | POA: Diagnosis not present

## 2015-06-03 DIAGNOSIS — Z510 Encounter for antineoplastic radiation therapy: Secondary | ICD-10-CM | POA: Diagnosis not present

## 2015-06-03 DIAGNOSIS — C50912 Malignant neoplasm of unspecified site of left female breast: Secondary | ICD-10-CM | POA: Diagnosis not present

## 2015-06-03 DIAGNOSIS — C50312 Malignant neoplasm of lower-inner quadrant of left female breast: Secondary | ICD-10-CM | POA: Diagnosis not present

## 2015-06-04 ENCOUNTER — Ambulatory Visit
Admission: RE | Admit: 2015-06-04 | Discharge: 2015-06-04 | Disposition: A | Discharge status: Home / Self Care | Payer: Medicare PPO | Source: Ambulatory Visit | Attending: Radiation Oncology | Admitting: Radiation Oncology

## 2015-06-04 ENCOUNTER — Telehealth: Payer: Self-pay | Admitting: *Deleted

## 2015-06-04 DIAGNOSIS — C50912 Malignant neoplasm of unspecified site of left female breast: Secondary | ICD-10-CM | POA: Diagnosis not present

## 2015-06-04 DIAGNOSIS — C50312 Malignant neoplasm of lower-inner quadrant of left female breast: Secondary | ICD-10-CM | POA: Diagnosis not present

## 2015-06-04 DIAGNOSIS — Z170 Estrogen receptor positive status [ER+]: Secondary | ICD-10-CM | POA: Diagnosis not present

## 2015-06-04 DIAGNOSIS — Z510 Encounter for antineoplastic radiation therapy: Secondary | ICD-10-CM | POA: Diagnosis not present

## 2015-06-04 DIAGNOSIS — C50212 Malignant neoplasm of upper-inner quadrant of left female breast: Secondary | ICD-10-CM | POA: Diagnosis not present

## 2015-06-04 NOTE — Telephone Encounter (Signed)
Left message to follow up after start of radiation.   

## 2015-06-07 ENCOUNTER — Ambulatory Visit
Admission: RE | Admit: 2015-06-07 | Discharge: 2015-06-07 | Disposition: A | Discharge status: Home / Self Care | Payer: Medicare PPO | Source: Ambulatory Visit | Attending: Radiation Oncology | Admitting: Radiation Oncology

## 2015-06-07 DIAGNOSIS — Z510 Encounter for antineoplastic radiation therapy: Secondary | ICD-10-CM | POA: Diagnosis not present

## 2015-06-07 DIAGNOSIS — Z170 Estrogen receptor positive status [ER+]: Secondary | ICD-10-CM | POA: Diagnosis not present

## 2015-06-07 DIAGNOSIS — C50312 Malignant neoplasm of lower-inner quadrant of left female breast: Secondary | ICD-10-CM | POA: Diagnosis not present

## 2015-06-07 DIAGNOSIS — C50912 Malignant neoplasm of unspecified site of left female breast: Secondary | ICD-10-CM | POA: Diagnosis not present

## 2015-06-07 DIAGNOSIS — C50212 Malignant neoplasm of upper-inner quadrant of left female breast: Secondary | ICD-10-CM | POA: Diagnosis not present

## 2015-06-08 ENCOUNTER — Ambulatory Visit
Admission: RE | Admit: 2015-06-08 | Discharge: 2015-06-08 | Disposition: A | Discharge status: Home / Self Care | Payer: Medicare PPO | Source: Ambulatory Visit | Attending: Radiation Oncology | Admitting: Radiation Oncology

## 2015-06-08 ENCOUNTER — Encounter: Payer: Self-pay | Admitting: Radiation Oncology

## 2015-06-08 VITALS — BP 177/92 | HR 63 | Temp 97.7°F | Ht 66.0 in | Wt 162.8 lb

## 2015-06-08 DIAGNOSIS — Z510 Encounter for antineoplastic radiation therapy: Secondary | ICD-10-CM | POA: Diagnosis not present

## 2015-06-08 DIAGNOSIS — Z170 Estrogen receptor positive status [ER+]: Secondary | ICD-10-CM | POA: Diagnosis not present

## 2015-06-08 DIAGNOSIS — E559 Vitamin D deficiency, unspecified: Secondary | ICD-10-CM | POA: Diagnosis not present

## 2015-06-08 DIAGNOSIS — C50212 Malignant neoplasm of upper-inner quadrant of left female breast: Secondary | ICD-10-CM | POA: Diagnosis not present

## 2015-06-08 DIAGNOSIS — C50912 Malignant neoplasm of unspecified site of left female breast: Secondary | ICD-10-CM | POA: Diagnosis not present

## 2015-06-08 DIAGNOSIS — C50312 Malignant neoplasm of lower-inner quadrant of left female breast: Secondary | ICD-10-CM | POA: Diagnosis not present

## 2015-06-08 DIAGNOSIS — M810 Age-related osteoporosis without current pathological fracture: Secondary | ICD-10-CM | POA: Diagnosis not present

## 2015-06-08 NOTE — Progress Notes (Signed)
Terri Ponce has received 9 fractions.  Reports no pain today to her left breast; has fleeting type pain to left breast that is up to a level 5 at times.   Appetite is good.  Energy was low last Friday has gotten better got lots of rest the weekend.  Skin looks good to left breast slight redness noted today, using the Radiaplex gel as directed. Had a thyroid biopsy 06-01-15 had abnormal result to see Dr. Harlow Asa.  BP 177/92 mmHg  Pulse 63  Temp(Src) 97.7 F (36.5 C) (Oral)  Ht 5\' 6"  (1.676 m)  Wt 162 lb 12.8 oz (73.846 kg)  BMI 26.29 kg/m2  SpO2 95%  Wt Readings from Last 3 Encounters:  06/08/15 162 lb 12.8 oz (73.846 kg)  05/31/15 162 lb 8 oz (73.71 kg)  04/28/15 161 lb 11.2 oz (73.347 kg)

## 2015-06-08 NOTE — Progress Notes (Addendum)
Weekly Management Note Current Dose: 24.03  Gy  Projected Dose: 52.72 Gy   Narrative:  The patient presents for routine under treatment assessment.  CBCT/MVCT images/Port film x-rays were reviewed.  The chart was checked. Doing well. Referred to thyroid surgeon for nodule. Daughter present. Fatigue and shortness of breath last Friday. Resolved with rest and iron supplements.   Physical Findings: Weight: 162 lb 12.8 oz (73.846 kg). Slightly pink left breast.   Impression:  The patient is tolerating radiation.  Plan:  Continue treatment as planned. Follow up with surgery [id number]

## 2015-06-09 ENCOUNTER — Ambulatory Visit
Admission: RE | Admit: 2015-06-09 | Discharge: 2015-06-09 | Disposition: A | Discharge status: Home / Self Care | Payer: Medicare PPO | Source: Ambulatory Visit | Attending: Radiation Oncology | Admitting: Radiation Oncology

## 2015-06-09 DIAGNOSIS — Z510 Encounter for antineoplastic radiation therapy: Secondary | ICD-10-CM | POA: Diagnosis not present

## 2015-06-09 DIAGNOSIS — Z170 Estrogen receptor positive status [ER+]: Secondary | ICD-10-CM | POA: Diagnosis not present

## 2015-06-09 DIAGNOSIS — C50312 Malignant neoplasm of lower-inner quadrant of left female breast: Secondary | ICD-10-CM | POA: Diagnosis not present

## 2015-06-09 DIAGNOSIS — C50912 Malignant neoplasm of unspecified site of left female breast: Secondary | ICD-10-CM | POA: Diagnosis not present

## 2015-06-09 DIAGNOSIS — C50212 Malignant neoplasm of upper-inner quadrant of left female breast: Secondary | ICD-10-CM | POA: Diagnosis not present

## 2015-06-10 ENCOUNTER — Ambulatory Visit
Admission: RE | Admit: 2015-06-10 | Discharge: 2015-06-10 | Disposition: A | Discharge status: Home / Self Care | Payer: Medicare PPO | Source: Ambulatory Visit | Attending: Radiation Oncology | Admitting: Radiation Oncology

## 2015-06-10 DIAGNOSIS — Z170 Estrogen receptor positive status [ER+]: Secondary | ICD-10-CM | POA: Diagnosis not present

## 2015-06-10 DIAGNOSIS — C50212 Malignant neoplasm of upper-inner quadrant of left female breast: Secondary | ICD-10-CM | POA: Diagnosis not present

## 2015-06-10 DIAGNOSIS — C50312 Malignant neoplasm of lower-inner quadrant of left female breast: Secondary | ICD-10-CM | POA: Diagnosis not present

## 2015-06-10 DIAGNOSIS — C50912 Malignant neoplasm of unspecified site of left female breast: Secondary | ICD-10-CM | POA: Diagnosis not present

## 2015-06-10 DIAGNOSIS — Z510 Encounter for antineoplastic radiation therapy: Secondary | ICD-10-CM | POA: Diagnosis not present

## 2015-06-11 ENCOUNTER — Ambulatory Visit
Admission: RE | Admit: 2015-06-11 | Discharge: 2015-06-11 | Disposition: A | Discharge status: Home / Self Care | Payer: Medicare PPO | Source: Ambulatory Visit | Attending: Radiation Oncology | Admitting: Radiation Oncology

## 2015-06-11 DIAGNOSIS — Z170 Estrogen receptor positive status [ER+]: Secondary | ICD-10-CM | POA: Diagnosis not present

## 2015-06-11 DIAGNOSIS — C50912 Malignant neoplasm of unspecified site of left female breast: Secondary | ICD-10-CM | POA: Diagnosis not present

## 2015-06-11 DIAGNOSIS — Z510 Encounter for antineoplastic radiation therapy: Secondary | ICD-10-CM | POA: Diagnosis not present

## 2015-06-14 ENCOUNTER — Ambulatory Visit
Admission: RE | Admit: 2015-06-14 | Discharge: 2015-06-14 | Disposition: A | Discharge status: Home / Self Care | Payer: Medicare PPO | Source: Ambulatory Visit | Attending: Radiation Oncology | Admitting: Radiation Oncology

## 2015-06-14 DIAGNOSIS — C50912 Malignant neoplasm of unspecified site of left female breast: Secondary | ICD-10-CM | POA: Diagnosis not present

## 2015-06-14 DIAGNOSIS — Z170 Estrogen receptor positive status [ER+]: Secondary | ICD-10-CM | POA: Diagnosis not present

## 2015-06-14 DIAGNOSIS — Z510 Encounter for antineoplastic radiation therapy: Secondary | ICD-10-CM | POA: Diagnosis not present

## 2015-06-14 DIAGNOSIS — C50212 Malignant neoplasm of upper-inner quadrant of left female breast: Secondary | ICD-10-CM | POA: Diagnosis not present

## 2015-06-14 DIAGNOSIS — C50312 Malignant neoplasm of lower-inner quadrant of left female breast: Secondary | ICD-10-CM | POA: Diagnosis not present

## 2015-06-15 ENCOUNTER — Ambulatory Visit
Admission: RE | Admit: 2015-06-15 | Discharge: 2015-06-15 | Disposition: A | Discharge status: Home / Self Care | Payer: Medicare PPO | Source: Ambulatory Visit | Attending: Radiation Oncology | Admitting: Radiation Oncology

## 2015-06-15 DIAGNOSIS — C50312 Malignant neoplasm of lower-inner quadrant of left female breast: Secondary | ICD-10-CM | POA: Diagnosis not present

## 2015-06-15 DIAGNOSIS — C50912 Malignant neoplasm of unspecified site of left female breast: Secondary | ICD-10-CM | POA: Diagnosis not present

## 2015-06-15 DIAGNOSIS — Z170 Estrogen receptor positive status [ER+]: Secondary | ICD-10-CM | POA: Diagnosis not present

## 2015-06-15 DIAGNOSIS — Z510 Encounter for antineoplastic radiation therapy: Secondary | ICD-10-CM | POA: Diagnosis not present

## 2015-06-15 DIAGNOSIS — C50212 Malignant neoplasm of upper-inner quadrant of left female breast: Secondary | ICD-10-CM

## 2015-06-15 NOTE — Progress Notes (Signed)
Weekly Management Note Current Dose: 37.38  Gy  Projected Dose: 52.72 Gy   Narrative:  The patient presents for routine under treatment assessment.  CBCT/MVCT images/Port film x-rays were reviewed.  The chart was checked. Doing well. Referred to thyroid surgeon for nodule. Daughter present. Fatigue and shortness of breath continues.  Physical Findings: Weight:  . Slightly pink left breast.   Impression:  The patient is tolerating radiation.  Plan:  Continue treatment as planned. Discussed possible increased boost dose but she is "done with my treatments and taking February off!).

## 2015-06-16 ENCOUNTER — Ambulatory Visit
Admission: RE | Admit: 2015-06-16 | Discharge: 2015-06-16 | Disposition: A | Discharge status: Home / Self Care | Payer: Medicare PPO | Source: Ambulatory Visit | Attending: Radiation Oncology | Admitting: Radiation Oncology

## 2015-06-16 DIAGNOSIS — Z170 Estrogen receptor positive status [ER+]: Secondary | ICD-10-CM | POA: Diagnosis not present

## 2015-06-16 DIAGNOSIS — Z510 Encounter for antineoplastic radiation therapy: Secondary | ICD-10-CM | POA: Diagnosis not present

## 2015-06-16 DIAGNOSIS — C50912 Malignant neoplasm of unspecified site of left female breast: Secondary | ICD-10-CM | POA: Diagnosis not present

## 2015-06-17 ENCOUNTER — Ambulatory Visit
Admission: RE | Admit: 2015-06-17 | Discharge: 2015-06-17 | Disposition: A | Discharge status: Home / Self Care | Payer: Medicare PPO | Source: Ambulatory Visit | Attending: Radiation Oncology | Admitting: Radiation Oncology

## 2015-06-17 DIAGNOSIS — C50312 Malignant neoplasm of lower-inner quadrant of left female breast: Secondary | ICD-10-CM | POA: Diagnosis not present

## 2015-06-17 DIAGNOSIS — C50912 Malignant neoplasm of unspecified site of left female breast: Secondary | ICD-10-CM | POA: Diagnosis not present

## 2015-06-17 DIAGNOSIS — C50212 Malignant neoplasm of upper-inner quadrant of left female breast: Secondary | ICD-10-CM | POA: Diagnosis not present

## 2015-06-17 DIAGNOSIS — Z170 Estrogen receptor positive status [ER+]: Secondary | ICD-10-CM | POA: Diagnosis not present

## 2015-06-17 DIAGNOSIS — Z510 Encounter for antineoplastic radiation therapy: Secondary | ICD-10-CM | POA: Diagnosis not present

## 2015-06-18 ENCOUNTER — Ambulatory Visit
Admission: RE | Admit: 2015-06-18 | Discharge: 2015-06-18 | Disposition: A | Discharge status: Home / Self Care | Payer: Medicare PPO | Source: Ambulatory Visit | Attending: Radiation Oncology | Admitting: Radiation Oncology

## 2015-06-18 DIAGNOSIS — C50912 Malignant neoplasm of unspecified site of left female breast: Secondary | ICD-10-CM | POA: Diagnosis not present

## 2015-06-18 DIAGNOSIS — Z170 Estrogen receptor positive status [ER+]: Secondary | ICD-10-CM | POA: Diagnosis not present

## 2015-06-18 DIAGNOSIS — Z510 Encounter for antineoplastic radiation therapy: Secondary | ICD-10-CM | POA: Diagnosis not present

## 2015-06-21 ENCOUNTER — Ambulatory Visit
Admission: RE | Admit: 2015-06-21 | Discharge: 2015-06-21 | Disposition: A | Discharge status: Home / Self Care | Payer: Medicare PPO | Source: Ambulatory Visit | Attending: Radiation Oncology | Admitting: Radiation Oncology

## 2015-06-21 DIAGNOSIS — Z170 Estrogen receptor positive status [ER+]: Secondary | ICD-10-CM | POA: Diagnosis not present

## 2015-06-21 DIAGNOSIS — Z510 Encounter for antineoplastic radiation therapy: Secondary | ICD-10-CM | POA: Diagnosis not present

## 2015-06-21 DIAGNOSIS — C50912 Malignant neoplasm of unspecified site of left female breast: Secondary | ICD-10-CM | POA: Diagnosis not present

## 2015-06-22 ENCOUNTER — Encounter: Payer: Self-pay | Admitting: Radiation Oncology

## 2015-06-22 ENCOUNTER — Ambulatory Visit
Admission: RE | Admit: 2015-06-22 | Discharge: 2015-06-22 | Disposition: A | Discharge status: Home / Self Care | Payer: Medicare PPO | Source: Ambulatory Visit | Attending: Radiation Oncology | Admitting: Radiation Oncology

## 2015-06-22 VITALS — BP 135/78 | HR 75 | Temp 97.6°F | Ht 66.0 in | Wt 159.9 lb

## 2015-06-22 DIAGNOSIS — C50212 Malignant neoplasm of upper-inner quadrant of left female breast: Secondary | ICD-10-CM | POA: Diagnosis present

## 2015-06-22 DIAGNOSIS — C50912 Malignant neoplasm of unspecified site of left female breast: Secondary | ICD-10-CM | POA: Diagnosis not present

## 2015-06-22 DIAGNOSIS — Z170 Estrogen receptor positive status [ER+]: Secondary | ICD-10-CM | POA: Diagnosis not present

## 2015-06-22 DIAGNOSIS — Z510 Encounter for antineoplastic radiation therapy: Secondary | ICD-10-CM | POA: Diagnosis not present

## 2015-06-22 MED ORDER — RADIAPLEXRX EX GEL
Freq: Once | CUTANEOUS | Status: AC
  Administered 2015-06-22: 10:00:00 via TOPICAL

## 2015-06-22 NOTE — Addendum Note (Signed)
Encounter addended by: Malena Edman, RN on: 06/22/2015 12:24 PM<BR>     Documentation filed: Notes Section, Inpatient Patient Education

## 2015-06-22 NOTE — Progress Notes (Addendum)
Mrs . Curreri has received 19 fractions reports 2/10 pain today.  Appetite is good.  Skin to left breast with redness using Radiaplex using Bid.  Energy level was low last Thursday better today. BP 135/78 mmHg  Pulse 75  Temp(Src) 97.6 F (36.4 C) (Oral)  Ht 5\' 6"  (1.676 m)  Wt 159 lb 14.4 oz (72.53 kg)  BMI 25.82 kg/m2  SpO2 98%    EOT educational instrucations given skin care after treatment Radiaplex for 2 weeks until redness goes away.  Then start lotion with vitamin E.  Asked if she had a F/U appointment with her medical oncologist.  Discussed survivorship visit will be scheduled by the survivorship nurse Chestine Spore.  One month F/U appointment given to come back to see Dr. Pablo Ledger.

## 2015-06-22 NOTE — Progress Notes (Signed)
Weekly Management Note Current Dose: 48.72  Gy  Projected Dose: 52.72 Gy   Narrative:  The patient presents for routine under treatment assessment.  CBCT/MVCT images/Port film x-rays were reviewed.  The chart was checked. Tired at the end of the week. Breast is doing well.   Physical Findings: Weight: 159 lb 14.4 oz (72.53 kg). Unchanged. Pink left breast.   Impression:  The patient is tolerating radiation.  Plan:  Continue treatment as planned. Discussed post RT follow up care. Follow up given in 1 month.

## 2015-06-23 ENCOUNTER — Ambulatory Visit
Admission: RE | Admit: 2015-06-23 | Discharge: 2015-06-23 | Disposition: A | Discharge status: Home / Self Care | Payer: Medicare PPO | Source: Ambulatory Visit | Attending: Radiation Oncology | Admitting: Radiation Oncology

## 2015-06-23 DIAGNOSIS — Z170 Estrogen receptor positive status [ER+]: Secondary | ICD-10-CM | POA: Diagnosis not present

## 2015-06-23 DIAGNOSIS — C50912 Malignant neoplasm of unspecified site of left female breast: Secondary | ICD-10-CM | POA: Diagnosis not present

## 2015-06-23 DIAGNOSIS — Z510 Encounter for antineoplastic radiation therapy: Secondary | ICD-10-CM | POA: Diagnosis not present

## 2015-06-23 DEATH — deceased

## 2015-06-24 ENCOUNTER — Ambulatory Visit
Admission: RE | Admit: 2015-06-24 | Discharge: 2015-06-24 | Disposition: A | Discharge status: Home / Self Care | Payer: Medicare PPO | Source: Ambulatory Visit | Attending: Radiation Oncology | Admitting: Radiation Oncology

## 2015-06-24 ENCOUNTER — Telehealth: Payer: Self-pay | Admitting: *Deleted

## 2015-06-24 ENCOUNTER — Encounter: Payer: Self-pay | Admitting: Radiation Oncology

## 2015-06-24 DIAGNOSIS — Z170 Estrogen receptor positive status [ER+]: Secondary | ICD-10-CM | POA: Diagnosis not present

## 2015-06-24 DIAGNOSIS — Z510 Encounter for antineoplastic radiation therapy: Secondary | ICD-10-CM | POA: Diagnosis not present

## 2015-06-24 DIAGNOSIS — C50912 Malignant neoplasm of unspecified site of left female breast: Secondary | ICD-10-CM | POA: Diagnosis not present

## 2015-06-24 NOTE — Telephone Encounter (Signed)
Left message for a return phone call to follow up after XRT completion.

## 2015-06-28 ENCOUNTER — Other Ambulatory Visit: Payer: Self-pay | Admitting: Adult Health

## 2015-06-28 DIAGNOSIS — C50212 Malignant neoplasm of upper-inner quadrant of left female breast: Secondary | ICD-10-CM

## 2015-06-28 NOTE — Progress Notes (Signed)
  Radiation Oncology         [phone] ________________________________  Name: Terri Ponce MRN: [id number]  Date: 06/24/2015  DOB: July 11, 1928  End of Treatment Note  Diagnosis:   Breast cancer of upper-inner quadrant of left female breast [facility])   Staging form: Breast, AJCC 7th Edition     Clinical: Stage Unknown (T2, NX, M0) - Signed by Chauncey Cruel, MD on 08/18/2014     Pathologic stage from 04/09/2015: Stage Unknown (T1c(m), NX, cM0) - Unsigned       Staging comments: Staged on surgical specimen by Dr. Donato Heinz  Indication for treatment:  Curative     Radiation treatment dates:   05/27/2015-06/24/2015  Site/dose:   Left breast/ 42.72 Gy at 2.67 Gy per fraction x 16 fractions.  Left breast boost/ 10 Gy at 2 Gy per fraction x 5 fractions  Beams/energy:  Opposed tangents with reduced fields / 6 MV photons Enface electrons / 12 and 9 MeV  Narrative: The patient tolerated radiation treatment relatively well.   She had some fatigue and some minor skin changes.  She declined further boost treatment to her medial breast as she was "too tired."  Plan: The patient has completed radiation treatment. The patient will return to radiation oncology clinic for routine followup in one month. I advised them to call or return sooner if they have any questions or concerns related to their recovery or treatment.  ------------------------------------------------  Thea Silversmith, MD

## 2015-06-29 DIAGNOSIS — C50919 Malignant neoplasm of unspecified site of unspecified female breast: Secondary | ICD-10-CM | POA: Diagnosis not present

## 2015-06-29 DIAGNOSIS — N390 Urinary tract infection, site not specified: Secondary | ICD-10-CM | POA: Diagnosis not present

## 2015-06-29 DIAGNOSIS — M545 Low back pain: Secondary | ICD-10-CM | POA: Diagnosis not present

## 2015-06-30 ENCOUNTER — Telehealth: Payer: Self-pay | Admitting: Oncology

## 2015-06-30 NOTE — Progress Notes (Signed)
Name: Terri Ponce   MRN: [id number]  Date:  06/17/15   DOB: 12/24/1928  Status:outpatient    DIAGNOSIS: Breast cancer of upper-inner quadrant of left female breast [facility])   Staging form: Breast, AJCC 7th Edition     Clinical: Stage Unknown (T2, NX, M0) - Signed by Chauncey Cruel, MD on 08/18/2014     Pathologic stage from 04/09/2015: Stage Unknown (T1c(m), NX, cM0) - Unsigned       Staging comments: Staged on surgical specimen by Dr. Donato Heinz    CONSENT VERIFIED: yes   SET UP: Patient is setup supine   IMMOBILIZATION:  The following immobilization was used:Custom Moldable Pillow, breast board.   NARRATIVE: [name] underwent complex simulation and treatment planning for her boost treatment today.  Her tumor volume was outlined on the planning CT scan. The depth of her cavity was felt to be appropriate for treatment with electrons    9 and 12  MeV electrons will be prescribed to the 100%  isodose line. A 26mm bolus will be placed daily for treatment.   I personally oversaw and approved the construction of a unique block which will be used for beam modification purposes.  An isodose plan is requested.

## 2015-06-30 NOTE — Telephone Encounter (Signed)
Called and left a message with survivorship appt °

## 2015-07-15 DIAGNOSIS — R350 Frequency of micturition: Secondary | ICD-10-CM | POA: Diagnosis not present

## 2015-07-19 ENCOUNTER — Other Ambulatory Visit: Payer: Self-pay | Admitting: *Deleted

## 2015-07-19 DIAGNOSIS — C50212 Malignant neoplasm of upper-inner quadrant of left female breast: Secondary | ICD-10-CM

## 2015-07-20 ENCOUNTER — Other Ambulatory Visit (HOSPITAL_BASED_OUTPATIENT_CLINIC_OR_DEPARTMENT_OTHER): Payer: Medicare PPO

## 2015-07-20 ENCOUNTER — Ambulatory Visit (HOSPITAL_BASED_OUTPATIENT_CLINIC_OR_DEPARTMENT_OTHER): Payer: Medicare PPO | Admitting: Oncology

## 2015-07-20 ENCOUNTER — Telehealth: Payer: Self-pay | Admitting: Oncology

## 2015-07-20 VITALS — BP 124/49 | HR 77 | Temp 97.8°F | Resp 18 | Ht 66.0 in | Wt 162.3 lb

## 2015-07-20 DIAGNOSIS — Z170 Estrogen receptor positive status [ER+]: Secondary | ICD-10-CM | POA: Diagnosis not present

## 2015-07-20 DIAGNOSIS — M810 Age-related osteoporosis without current pathological fracture: Secondary | ICD-10-CM | POA: Diagnosis not present

## 2015-07-20 DIAGNOSIS — C50312 Malignant neoplasm of lower-inner quadrant of left female breast: Secondary | ICD-10-CM

## 2015-07-20 DIAGNOSIS — C50212 Malignant neoplasm of upper-inner quadrant of left female breast: Secondary | ICD-10-CM

## 2015-07-20 LAB — CBC WITH DIFFERENTIAL/PLATELET
BASO%: 0.3 % (ref 0.0–2.0)
BASOS ABS: 0 10*3/uL (ref 0.0–0.1)
EOS ABS: 0.2 10*3/uL (ref 0.0–0.5)
EOS%: 2.6 % (ref 0.0–7.0)
HEMATOCRIT: 38.3 % (ref 34.8–46.6)
HEMOGLOBIN: 12.8 g/dL (ref 11.6–15.9)
LYMPH#: 1.8 10*3/uL (ref 0.9–3.3)
LYMPH%: 24.5 % (ref 14.0–49.7)
MCH: 30 pg (ref 25.1–34.0)
MCHC: 33.4 g/dL (ref 31.5–36.0)
MCV: 89.9 fL (ref 79.5–101.0)
MONO#: 0.5 10*3/uL (ref 0.1–0.9)
MONO%: 6.9 % (ref 0.0–14.0)
NEUT%: 65.7 % (ref 38.4–76.8)
NEUTROS ABS: 4.8 10*3/uL (ref 1.5–6.5)
Platelets: 233 10*3/uL (ref 145–400)
RBC: 4.26 10*6/uL (ref 3.70–5.45)
RDW: 13.9 % (ref 11.2–14.5)
WBC: 7.3 10*3/uL (ref 3.9–10.3)

## 2015-07-20 LAB — COMPREHENSIVE METABOLIC PANEL
ALBUMIN: 3.8 g/dL (ref 3.5–5.0)
ALK PHOS: 86 U/L (ref 40–150)
ALT: 14 U/L (ref 0–55)
AST: 16 U/L (ref 5–34)
Anion Gap: 7 mEq/L (ref 3–11)
BILIRUBIN TOTAL: 0.3 mg/dL (ref 0.20–1.20)
BUN: 22.8 mg/dL (ref 7.0–26.0)
CALCIUM: 9.5 mg/dL (ref 8.4–10.4)
CO2: 29 mEq/L (ref 22–29)
Chloride: 105 mEq/L (ref 98–109)
Creatinine: 1 mg/dL (ref 0.6–1.1)
EGFR: 54 mL/min/{1.73_m2} — AB (ref >90)
GLUCOSE: 115 mg/dL (ref 70–140)
POTASSIUM: 3.8 meq/L (ref 3.5–5.1)
SODIUM: 141 meq/L (ref 136–145)
TOTAL PROTEIN: 6.3 g/dL — AB (ref 6.4–8.3)

## 2015-07-20 MED ORDER — IBANDRONATE SODIUM 150 MG PO TABS: 150.0000 mg | ORAL_TABLET | ORAL | Status: DC

## 2015-07-20 NOTE — Telephone Encounter (Signed)
Gave patient avs report and appointments for April (SCP visit) - May and June.

## 2015-07-20 NOTE — Progress Notes (Signed)
Terri Ponce  [phone] [phone]) [phone]     ID: Terri Ponce OB: Mar 10, 1929  MR#: [id number]  QIH#:[id number]  PCP: Terri Argyle, MD GYN:   SU: Terri Ponce, Terri Ponce OTHER MD: Terri Ponce  CHIEF COMPLAINT: Locally advanced left breast cancer  CURRENT TREATMENT: Anastrozole   BREAST CANCER HISTORY: From the original intake Notes 08/13/2013:   [name] had screening mammography December of 2012 showing a low density oval nodule at the 9:00 position which was not palpable and which by ultrasound appeared to be a cyst. 6 month reevaluation along the 11/29/2011 showed a small nodule to have entirely resolved. Return to screening mammography was recommended.  However in January of 2015 the patient noted a change in her left breast which he initially felt was due to refer her bra rubbing on her skin. She was set up for bilateral diagnostic mammography and left breast ultrasonography at the breast center 06/20/2013. There was an asymmetry in the left breast area of palpable concern in exam showed a firm raised red area of spanning 3 cm along the far medial left breast inframammary fold. Ultrasound confirmed an area of skin thickening in ill-defined change measuring 1.7 cm. This extended to the skin. It was felt to be possibly due to an infected sebaceous cyst, and the patient was given antibiotics for a week  On 07/04/2013 repeat ultrasound of the left breast showed an irregular shadowing mass at the 8:00 position measuring 1.6 cm. The left axilla showed no lymphadenopathy. Physical exam showed a firm mass in this area. Biopsy was recommended and the patient's Rivaroxaban was temporarily stopped. On 07/16/2013, biopsy of the left breast 8:00 area in question showed ([id number]) and invasive lobular carcinoma (E-cadherin negative), grade 1, estrogen receptor 100% positive, progesterone receptor 74% positive, with an MIB-1 of 16% and no HER-2  amplification, the signals ratio being 1.29 and the number per cell 2.25.  On 07/25/2013 the patient underwent bilateral breast MRI. This showed in the lower inner quadrant of the left breast in the inframammary fold a 3.2 cm irregular enhancing mass containing a biopsy clip. Some spicules of enhancement extended posteriorly to the liver of the pectoralis. The mass abuts and appears to involve overlying skin. In addition, a 9 mm separate area of nodular enhancement was noted anterior and medial to the biopsied area. There were 2 left internal mammary lymph nodes measuring 4 mm and 2 mm. There was also a morphologically abnormal 1 cm lymph node in the left axilla. Incidental finding in the liver noted multiple masses favored to represent cysts.  On 08/04/2013 the patient underwent biopsy of the suspicious left axillary lymph node and this showed ([id number]) no evidence of malignancy. This was felt to be "possibly not concordant". Repeat MRI in 6 months (assuming neoadjuvant treatment) was recommended.  The patient has met with Dr. Donne Hazel and Dr. Pablo Ledger. Dr. Donne Hazel have set the patient up for biopsy of the satellite lesion in the left breast. She feels the patient would benefit from neoadjuvant endocrine therapy. Dr. Pablo Ledger feels the patient will need radiation, even if she eventually ends up having a mastectomy.  The patient's subsequent history is as detailed below.  INTERVAL HISTORY: "Terri Ponce" returns today for followup of her early stage breast cancer accompanied by her daughter Terri Ponce. Terri Ponce completed her radiation treatments earlier this month. She did well with those, with some fatigue which persists, and some skin erythema which is clearing. She did not have problems  with desquamation.--She continues on anastrozole, with excellent tolerance. She is not having problems with hot flashes, vaginal dryness, or arthralgias/myalgias. She obtains it at a good price.  REVIEW OF  SYSTEMS: Terri Ponce was recently treated for urinary tract infection with 5 days of Cipro, but with symptoms recurring she received 7 additional days. She is now completing dose. She has a thyroid nodule which is being followed and she has been referred to Dr. Harlow Asa for further evaluation. She is very concerned about going through any more surgeries at this point. She is on apixaban without any bleeding complications. Aside from these issues a detailed review of systems today was stable  PAST MEDICAL HISTORY: Past Medical History  Diagnosis Date  . A-fib ([place])   . History of blood transfusion 1956    "related to the SALPINGOOPHORECTOMY"  . Hypertension   . Shortness of breath dyspnea     with exertion or excitement  . Multiple thyroid nodules     "very small; dx'd w/carotid doppler study"  . Anemia   . Hepatitis A   . PONV (postoperative nausea and vomiting) 1956    none since  . Walking pneumonia X 1    "self dx'd"  . Scintillating scotoma of right eye dx'd 02/2015  . Arthritis     "stiffness and soreness in my joints; particularly knee joints and fingers" (04/08/2015)  . Anxiety   . Depression     still grieving husband's death from 02/15/14  . Cancer of left breast ([place]) dx'd 06/2013   possible history of TIAs  PAST SURGICAL HISTORY: Past Surgical History  Procedure Laterality Date  . Salpingoophorectomy Right 1956  . Cataract extraction w/ intraocular lens implant Right   . Wrist fracture surgery Left July 2010    titanium  . Tonsillectomy and adenoidectomy    . Appendectomy  1956  . Fracture surgery    . Breast biopsy Left 07/16/2013  . Breast lumpectomy Left 04/08/2015    WITH RADIOACTIVE SEED LOCALIZATION   . Squamous cell carcinoma excision   09/2014    "forehead"  . Basal cell carcinoma excision   09/2014    "forehead"  . Breast lumpectomy with radioactive seed localization Left 04/08/2015    Procedure: BREAST LUMPECTOMY WITH RADIOACTIVE SEED LOCALIZATION;   Surgeon: Terri Bookbinder, MD;  Location: [facility] OR;  Service: General;  Laterality: Left;    FAMILY HISTORY Family History  Problem Relation Age of Onset  . Arrhythmia Mother     A-Fib  . Hyperlipidemia Mother   . Hypertension Mother   . Cancer Mother     breast cancer  . Cancer Sister    the patient's father died at the age of 39, from complications of asbestosis. The patient's mother died at the age of 48. She had been diagnosed with breast cancer a few years before. The patient had no brothers, one sister. His sister died at age 67 from cancer which was metastatic but the patient does not know the primary  GYNECOLOGIC HISTORY:  Menarche age 79, first live birth age 67, the patient is [place] P2. She went through menopause at around age 30 or 58. She did not take hormone replacement. She took birth control pills for some years without any clotting complications  SOCIAL HISTORY:  France Lusty has a Masters degree in public health, specifically nutrition. Her husband Terri Ponce was an Chief Financial Officer and he worked for [employer] division. Daughter Terri Ponce (goes by "Terri Ponce") is a Architect with  the [employer].  Son lives in [place] worry works as an Associate Professor. The patient has 2 grandchildren. She is a [employer]    ADVANCED DIRECTIVES:  in place; the patient's daughter is her healthcare power of attorney   HEALTH MAINTENANCE: Social History  Substance Use Topics  . Smoking status: Never Smoker   . Smokeless tobacco: Never Used  . Alcohol Use: No     Colonoscopy: Remote  PAP: remote  Bone density: 2014; "good"   Lipid panel:  Allergies  Allergen Reactions  . Sulfonamide Derivatives Other (See Comments)    Extreme anxiety  . Other Rash    Shingles vaccine  . Amoxicillin Other (See Comments)    abdominal pain    Current Outpatient Prescriptions  Medication Sig Dispense Refill  . acetaminophen (TYLENOL) 325 MG tablet Take 325 mg by mouth daily as needed (pain).     Marland Kitchen anastrozole (ARIMIDEX) 1 MG tablet Take 1 tablet (1 mg total) by mouth daily. (Patient taking differently: Take 1 mg by mouth See admin instructions. Take 1 tablet (1 mg) by mouth at midnight or 1am) 90 tablet 0  . apixaban (ELIQUIS) 5 MG TABS tablet Take 1 tablet (5 mg total) by mouth 2 (two) times daily. (Patient taking differently: Take 5 mg by mouth every evening. ) 180 tablet 3  . aspirin 81 MG tablet Take 81 mg by mouth daily. Reported on 06/08/2015    . Calcium-Magnesium 500-250 MG TABS Take 1 tablet by mouth daily.    . Calcium-Magnesium-Vitamin D 600-40-500 MG-MG-UNIT TB24 Take 1,200 mEq by mouth.    . Cholecalciferol (VITAMIN D3) 2000 UNITS TABS Take 2,000 Units by mouth daily.    Marland Kitchen CRANBERRY PO Take 1 tablet by mouth every other day.     . diltiazem (CARDIZEM) 30 MG tablet Take 1 tablet (30 mg total) by mouth daily. (Patient taking differently: Take 30 mg by mouth at bedtime. ) 90 tablet 3  . HYDROcodone-acetaminophen (NORCO/VICODIN) 5-325 MG tablet Take 1 tablet by mouth every 6 (six) hours as needed for severe pain. 10 tablet 0  . Multiple Vitamin (MULTIVITAMIN WITH MINERALS) TABS tablet Take 1 tablet by mouth daily.    Marland Kitchen OVER THE COUNTER MEDICATION Place 1 drop into both eyes daily. Over the counter [employer] lubricating eye drop    . Saline 0.9 % (SOLN) SOLN Place 1 drop into both nostrils daily as needed (dry nasal passages).     No current facility-administered medications for this visit.    OBSERVATION: Elderly white woman in no acute distress Filed Vitals:   07/20/15 1600  BP: 124/49  Pulse: 77  Temp: 97.8 F (36.6 C)  Resp: 18     Body mass index is 26.21 kg/(m^2).    ECOG FS:1 - Symptomatic but completely ambulatory  Sclerae unicteric, pupils round and equal Oropharynx clear and moist-- no thrush or other lesions No cervical or supraclavicular adenopathy Lungs no rales or rhonchi Heart regular rate and rhythm Abd soft, nontender, positive bowel sounds MSK  no focal spinal tenderness, no upper extremity lymphedema Neuro: nonfocal, well oriented, positive affect Breasts: The right breast shows no suspicious findings. The left breast is status post lumpectomy and radiation. The cosmetic result is good. There is minimal residual erythema. There is no desquamation. The left axilla is benign.   LAB RESULTS:  CMP     Component Value Date/Time   NA 141 07/20/2015 1534   NA 138 04/05/2015 1305   NA 139 03/09/2014  1625    I No results found for: SPEP  Lab Results  Component Value Date   WBC 7.3 07/20/2015      Chemistry      Component Value Date/Time   NA 141 07/20/2015 1534   NA 138 04/05/2015 1305   NA 139 03/09/2014 1625      Component Value Date/Time   CALCIUM 9.5 07/20/2015 1534   CALCIUM 10.5* 04/05/2015 1305   CALCIUM 8.8 03/09/2014 1625       No results found for: LABCA2  No components found for: LABCA125  No results for input(s): INR in the last 168 hours.  Urinalysis No results found for: COLORURINE  STUDIES: No results found.  ASSESSMENT: 81 y.o. [place] woman status post left breast biopsy 07/16/2013 for a clinically multifocal T2 N0-3 (stage IIA-IIIC) invasive lobular breast cancer, estrogen receptor 100% positive, progesterone receptor 74% positive, with an MIB-1 of 16% and no HER-2 amplification  (1) biopsy of a suspicious left axillary lymph node 08/04/2013 was negative, but possibly discordant  (2) the patient has 2 suspicious left internal mammary lymph nodes as well as some indeterminate liver lesions which were negative on PET scan 08/26/2013 and by repeat breast MRI are c/w cysts  (3) MRI biopsy of a satellite lesion in the left breast 08/15/2013 showed atypical lobular hyperplasia.  (4) anastrozole started 08/13/2013  (a) breast MRI 01/20/2014 shows near complete resolution of the measurable mass  (b) breast MRI 01/27/2015 shows evidence of progression  (5) DEXA scan 03/05/2013 at [place] showed osteoporosis with the lowest T score at -2.7  (6) left lumpectomy 04/08/2015 showed a pT1c pN0, stage IA invasive ductal carcinoma, grade 1, repeat HER-2 again negative.  (7) adjuvant radiation 05/27/2015-06/24/2015:  Left breast/ 42.72 Gy at 2.67 Gy per fraction x 16 fractions.  Left breast boost/ 10 Gy at 2 Gy per fraction x 5 fractions  (8) thyroid nodule s/p biopsy 06/01/2015  (9 osteoporosis, with a T score of -2.7 on bone density scan 03/05/2013   PLAN: Geniece Akers is doing fine from a breast cancer point of view, and the plan is to continue anastrozole for a total of 5 years.  This may worsen her already present osteoporosis. Today we discussed various options that she could choose, including ibandronate, or denosumab. We discussed the possible toxicities, side effects and complications of these agents. She much prefers the idea of a pill, and I gave her instructions on how to take ibandronate monthly on an empty stomach with a glass of water and while remaining vertical a minimum of 1 hour. She will start this March 1.  I do not have a copy of her thyroid biopsy but from her report it was not clearly malignant. She understands thyroid cancers frequently can smolder for decades. She is not eager to undergo surgery and her daughter in particular is attracted by the idea of close observation. They will discuss all this with Dr. Harlow Asa when they meet with him in the near future.  She has already enrolled in [place] and I think this will help her get out of the fatigue that she is experiencing from radiation. Once she completes that program I will introduce her to trails to recovery.   Otherwise she will return to see me again in 6 months from now. She knows to call for any problems that may develop before that visit.  Chauncey Cruel, MD   07/20/2015 4:22 PM

## 2015-07-21 NOTE — Progress Notes (Signed)
Boniva prescription faxed to [facility]

## 2015-07-25 ENCOUNTER — Other Ambulatory Visit: Payer: Self-pay | Admitting: Oncology

## 2015-07-27 DIAGNOSIS — H906 Mixed conductive and sensorineural hearing loss, bilateral: Secondary | ICD-10-CM | POA: Diagnosis not present

## 2015-07-30 NOTE — Progress Notes (Signed)
{  Pain :None Pt complains of  Pt left breast tenderness. Appetite is good. BP 135/99 mmHg  Pulse 77  Temp(Src) 97.5 F (36.4 C) (Oral)  Resp 18  Ht 5\' 6"  (1.676 m)  Wt 163 lb 9.6 oz (74.208 kg)  BMI 26.42 kg/m2  SpO2 95% Wt Readings from Last 3 Encounters:  07/20/15 162 lb 4.8 oz (73.619 kg)  06/22/15 159 lb 14.4 oz (72.53 kg)  06/08/15 162 lb 12.8 oz (73.846 kg)   Pt reports: Yes No Comments  Nolvadex (tamoxifen) []  []    Arimidex (anastrozole) [x]  []    Last Med Onc Appt: Date:07-20-15 Next:10-21-15 Doctor: Magrinat  Last Mammogram: Date:No Next:   Survivorship Appt: Date:08-26-15 [] [facility] pamphlet []  [employer]

## 2015-08-05 ENCOUNTER — Ambulatory Visit
Admission: RE | Admit: 2015-08-05 | Discharge: 2015-08-05 | Disposition: A | Discharge status: Home / Self Care | Payer: Medicare PPO | Source: Ambulatory Visit | Attending: Radiation Oncology | Admitting: Radiation Oncology

## 2015-08-05 ENCOUNTER — Encounter: Payer: Self-pay | Admitting: Radiation Oncology

## 2015-08-05 VITALS — BP 135/99 | HR 77 | Temp 97.5°F | Resp 18 | Ht 66.0 in | Wt 163.6 lb

## 2015-08-05 DIAGNOSIS — C50212 Malignant neoplasm of upper-inner quadrant of left female breast: Secondary | ICD-10-CM

## 2015-08-05 NOTE — Progress Notes (Addendum)
Department of Radiation Oncology  Phone:  [phone] Fax:        [phone]  Name: Terri Ponce MRN: [id number]  DOB: 08-02-1928  Date: 08/05/2015  Follow Up Visit Note  Diagnosis: Breast cancer of upper-inner quadrant of left female breast [facility])   Staging form: Breast, AJCC 7th Edition     Clinical: Stage Unknown (T2, NX, M0) - Signed by Chauncey Cruel, MD on 08/18/2014     Pathologic stage from 04/09/2015: Stage Unknown (T1c(m), NX, cM0) - Unsigned       Staging comments: Staged on surgical specimen by Dr. Donato Heinz  Summary and Interval since last radiation:  05/27/2015-06/24/2015  Site/dose:   Left breast/ 42.72 Gy at 2.67 Gy per fraction x 16 fractions.  Left breast boost/ 10 Gy at 2 Gy per fraction x 5 fractions  Interval History: Terri Ponce presents today for routine followup. The patient is doing well on today's visit. She says she is taking her anastrozole. She states she has used an OTC antifungal  to treat a fungal infection under her breast, which has since resolved. She was also treated for a UTI.  She has started on [employer] which she enjoys.  She is accompanied by her daughter.   Pain :None  Pt complains of  Pt left breast tenderness.  Appetite is good.  BP 135/99 mmHg  Pulse 77  Temp(Src) 97.5 F (36.4 C) (Oral)  Resp 18  Ht 5\' 6"  (1.676 m)  Wt 163 lb 9.6 oz (74.208 kg)  BMI 26.42 kg/m2  SpO2 95%    Wt Readings from Last 3 Encounters:    07/20/15  162 lb 4.8 oz (73.619 kg)    06/22/15  159 lb 14.4 oz (72.53 kg)    06/08/15  162 lb 12.8 oz (73.846 kg)       Pt reports:  Yes  No  Comments    Nolvadex (tamoxifen)  [ ]   [ ]      Arimidex (anastrozole)  [X]   [ ]      Last Med Onc Appt:  Date:07-20-15  Next:10-21-15  Doctor: Magrinat    Last Mammogram:  Date:No  Next:     Survivorship Appt:  Date:08-26-15  [ ] [facility] pamphlet  [ ]  [employer] pamphlet     Physical Exam:  Filed Vitals:   08/05/15 0813  BP: 135/99  Pulse: 77  Temp: 97.5 F (36.4 C)  TempSrc: Oral    Resp: 18  Height: 5\' 6"  (1.676 m)  Weight: 163 lb 9.6 oz (74.208 kg)  SpO2: 95%  -No palpable cervical, supraclavicular or axillary lymphoadenopathy. The heart has a regular rate and rhythm. The lungs are clear to auscultation.  -The skin on left breast is healing well. No nipple bleeding or discharge. No palpable mass appreciated.  IMPRESSION: Terri Ponce is a 82 y.o. female who is recovering from the effects of radiation. No other areas of concern.  PLAN: Continued follow up with Dr. Jana Hakim in June. She is doing well. We discussed the need for follow up every 4-6 months which she has scheduled.  We discussed the need for yearly mammograms which she can schedule with medical oncology. We discussed the need for sun protection in the treated area.  She can always call me with questions.  I will follow up with her on an as needed basis.   She will continue with [employer]. She will meet with survivorship which we scheduled for her.    ------------------------------------------------  Thea Silversmith, MD  This document serves  as a record of services personally performed by Thea Silversmith, MD. It was created on her behalf by Derek Mound, a trained medical scribe. The creation of this record is based on the scribe's personal observations and the provider's statements to them. This document has been checked and approved by the attending provider.

## 2015-08-17 DIAGNOSIS — D440 Neoplasm of uncertain behavior of thyroid gland: Secondary | ICD-10-CM | POA: Diagnosis not present

## 2015-08-20 ENCOUNTER — Other Ambulatory Visit: Payer: Self-pay | Admitting: Surgery

## 2015-08-20 ENCOUNTER — Telehealth: Payer: Self-pay | Admitting: Cardiovascular Disease

## 2015-08-20 DIAGNOSIS — E041 Nontoxic single thyroid nodule: Secondary | ICD-10-CM

## 2015-08-20 DIAGNOSIS — C50912 Malignant neoplasm of unspecified site of left female breast: Secondary | ICD-10-CM | POA: Diagnosis not present

## 2015-08-20 NOTE — Telephone Encounter (Signed)
Clearance printed and placed in HIM to be faxed to [place]

## 2015-08-20 NOTE — Telephone Encounter (Signed)
New message      Request for surgical clearance:  What type of surgery is being performed? Thyroid biopsy When is this surgery scheduled? Pending clearance Are there any medications that need to be held prior to surgery and how long? Hold eliquis 48hr prior to biopsy Name of physician performing surgery?  No sure who is going to do it 1. What is your office phone and fax number?  Fax [phone]

## 2015-08-20 NOTE — Telephone Encounter (Signed)
Low risk for thyroid bx She may hold her Eliquis for 48 hours prior to bx

## 2015-08-25 ENCOUNTER — Telehealth: Payer: Self-pay

## 2015-08-25 NOTE — Telephone Encounter (Signed)
Patient's daughter called and states that patient took Boniva on Monday night.  Tuesday she had "terrible body aches and lethergy".  Per Dr. Jana Hakim patient should take 4 TUMS and aleve/ibuprofen.  Daughter stated understanding and will update if symptoms worsen.

## 2015-08-26 ENCOUNTER — Telehealth: Payer: Self-pay | Admitting: Adult Health

## 2015-08-26 ENCOUNTER — Encounter: Payer: Medicare PPO | Admitting: Nurse Practitioner

## 2015-08-26 NOTE — Telephone Encounter (Signed)
I received a voicemail from Terri Ponce stating that she was having car trouble and would not be able to make it to her appt today.  She left the following phone number where she could be reached to reschedule: [phone].  I have made Chestine Spore, NP aware of the patient's call, as she was scheduled to see her today.   Mike Craze, NP [place] [phone]

## 2015-08-27 ENCOUNTER — Other Ambulatory Visit: Payer: Self-pay | Admitting: Nurse Practitioner

## 2015-08-30 ENCOUNTER — Telehealth: Payer: Self-pay | Admitting: Nurse Practitioner

## 2015-08-30 NOTE — Telephone Encounter (Signed)
left msg for resched scp appt for april

## 2015-08-31 DIAGNOSIS — H2512 Age-related nuclear cataract, left eye: Secondary | ICD-10-CM | POA: Diagnosis not present

## 2015-08-31 DIAGNOSIS — H5213 Myopia, bilateral: Secondary | ICD-10-CM | POA: Diagnosis not present

## 2015-08-31 DIAGNOSIS — H40012 Open angle with borderline findings, low risk, left eye: Secondary | ICD-10-CM | POA: Diagnosis not present

## 2015-08-31 DIAGNOSIS — H40011 Open angle with borderline findings, low risk, right eye: Secondary | ICD-10-CM | POA: Diagnosis not present

## 2015-09-06 DIAGNOSIS — C50912 Malignant neoplasm of unspecified site of left female breast: Secondary | ICD-10-CM | POA: Diagnosis not present

## 2015-09-07 ENCOUNTER — Ambulatory Visit (HOSPITAL_BASED_OUTPATIENT_CLINIC_OR_DEPARTMENT_OTHER): Payer: Medicare PPO | Admitting: Nurse Practitioner

## 2015-09-07 ENCOUNTER — Encounter: Payer: Self-pay | Admitting: Nurse Practitioner

## 2015-09-07 VITALS — BP 114/53 | HR 73 | Temp 98.1°F | Resp 18 | Ht 66.0 in | Wt 164.5 lb

## 2015-09-07 DIAGNOSIS — C50212 Malignant neoplasm of upper-inner quadrant of left female breast: Secondary | ICD-10-CM | POA: Diagnosis not present

## 2015-09-07 NOTE — Progress Notes (Signed)
CLINIC:  Cancer Survivorship   REASON FOR VISIT:  Routine follow-up post-treatment for a recent history of breast cancer.  BRIEF ONCOLOGIC HISTORY:    Breast cancer of upper-inner quadrant of left female breast ([place])   06/20/2013 Mammogram Asymmetry partially visualized in the region of palpable concern along the far medial posterior left breast, confirmed on the spot compression tangential view   07/16/2013 Initial Biopsy Left breast bx: invasive lobular carcinoma, ER+ (100%), PR+ (74%), HER2/neu negative, Ki67 16%.   07/25/2013 Breast MRI 3.2 x 1.6 x 2.2 cm irregular enhancing mass in lower inner quadrant of left breast - ? skin; 1.5 cm medial and anterior to the biopsy proven mass is a 0.9 x 0.2 cm area of nodular enhancement; Two nonspecific left internal mammary lymph nodes   08/04/2013 Procedure Left axillary LN bx: negative, possibly discordant   08/13/2013 -  Neo-Adjuvant Anti-estrogen oral therapy Anastrozole 1 mg daily   08/15/2013 Procedure MRI biopsy of left breast satellite lesion shows atypical lobular hyperplasia   08/15/2013 Clinical Stage Stage IIA-IIIC: T2 N0-3   01/20/2014 Breast MRI Interval continued improvement consistent with a positive response to therapy   01/27/2015 Breast MRI Interval increase in enhancement at the site of biopsy proven malignancy in the lower inner quadrant of the left breast concerning for worsening disease    04/08/2015 Definitive Surgery Left lumpectomy: invasive lobular carcinoma, grade 1, HER2/neu repeated and negative (ratio 1.15)   04/08/2015 Pathologic Stage Stage IA: pT1c pNx   05/27/2015 - 06/24/2015 Radiation Therapy Left breast/ 42.72 Gy at 2.67 Gy per fraction x 16 fractions.  Left breast boost/ 10 Gy at 2 Gy per fraction x 5 fractions    INTERVAL HISTORY:  Terri Ponce presents to the [facility] today for our initial meeting to review her survivorship care plan detailing her treatment course for breast cancer, as well as monitoring  long-term side effects of that treatment, education regarding health maintenance, screening, and overall wellness and health promotion.     Overall, Terri Ponce reports feeling quite well since completing her radiation therapy approximately two months ago.  She continues with fatigue but reports the skin changes overlying her left breast having improved.  She denies headache, cough, shortness of breath or bone pain.  She continues on her anastrozole with hot flashes, which she states are bearable. She has some joint pain in her fingers but denies vaginal dryness.  She has a good appetite and denies any weight loss.  She is participating in the [facility] program and greatly enjoying it.  She has completed grief counseling through [facility], which she found to be very helpful following the loss of her husband. She reports great support from her family. She denies mass or lesion in either breast.  REVIEW OF SYSTEMS:  General: Fatigue and hot flashes as above. Denies fever, chills, or unintentional weight loss. HEENT: Denies visual changes, hearing loss, mouth sores or difficulty swallowing. Cardiac: Denies palpitations, chest pain, and lower extremity edema.  Respiratory: Denies wheeze or dyspnea on exertion.  Breast: As above. GI: Denies abdominal pain, constipation, diarrhea, nausea, or vomiting.  GU: Denies dysuria, hematuria, vaginal bleeding, vaginal discharge, or vaginal dryness.  Musculoskeletal: As above. Neuro: Denies recent fall or numbness / tingling in her extremities. Skin: Denies rash, pruritis, or open wounds.  Psych: Denies depression, anxiety, insomnia, or memory loss.   A 14-point review of systems was completed and was negative, except as noted above.   ONCOLOGY TREATMENT TEAM:  1. Surgeon:  Dr. Donne Hazel at [facility]  2. Medical Oncologist: Dr. Jana Hakim 3. Radiation Oncologist: Dr. Pablo Ledger    PAST MEDICAL/SURGICAL HISTORY:  Past Medical History  Diagnosis  Date  . A-fib ([place])   . History of blood transfusion 1956    "related to the SALPINGOOPHORECTOMY"  . Hypertension   . Shortness of breath dyspnea     with exertion or excitement  . Multiple thyroid nodules     "very small; dx'd w/carotid doppler study"  . Anemia   . Hepatitis A   . PONV (postoperative nausea and vomiting) 1956    none since  . Walking pneumonia X 1    "self dx'd"  . Scintillating scotoma of right eye dx'd 02/2015  . Arthritis     "stiffness and soreness in my joints; particularly knee joints and fingers" (04/08/2015)  . Anxiety   . Depression     still grieving husband's death from 02/25/2014  . Cancer of left breast ([place]) dx'd 06/2013   Past Surgical History  Procedure Laterality Date  . Salpingoophorectomy Right 1956  . Cataract extraction w/ intraocular lens implant Right   . Wrist fracture surgery Left July 2010    titanium  . Tonsillectomy and adenoidectomy    . Appendectomy  1956  . Fracture surgery    . Breast biopsy Left 07/16/2013  . Breast lumpectomy Left 04/08/2015    WITH RADIOACTIVE SEED LOCALIZATION   . Squamous cell carcinoma excision   09/2014    "forehead"  . Basal cell carcinoma excision   09/2014    "forehead"  . Breast lumpectomy with radioactive seed localization Left 04/08/2015    Procedure: BREAST LUMPECTOMY WITH RADIOACTIVE SEED LOCALIZATION;  Surgeon: Rolm Bookbinder, MD;  Location: [place];  Service: General;  Laterality: Left;     ALLERGIES:  Allergies  Allergen Reactions  . Sulfonamide Derivatives Other (See Comments)    Extreme anxiety  . Other Rash    Shingles vaccine  . Amoxicillin Other (See Comments)    abdominal pain     CURRENT MEDICATIONS:  Current Outpatient Prescriptions on File Prior to Visit  Medication Sig Dispense Refill  . acetaminophen (TYLENOL) 325 MG tablet Take 325 mg by mouth daily as needed (pain).    Marland Kitchen anastrozole (ARIMIDEX) 1 MG tablet Take 1 tablet (1 mg total) by mouth daily. (Patient  taking differently: Take 1 mg by mouth See admin instructions. Take 1 tablet (1 mg) by mouth at midnight or 1am) 90 tablet 0  . apixaban (ELIQUIS) 5 MG TABS tablet Take 1 tablet (5 mg total) by mouth 2 (two) times daily. (Patient taking differently: Take 5 mg by mouth every evening. ) 180 tablet 3  . Calcium-Magnesium 500-250 MG TABS Take 1 tablet by mouth daily.    . Calcium-Magnesium-Vitamin D 600-40-500 MG-MG-UNIT TB24 Take 1,200 mEq by mouth.    . Cholecalciferol (VITAMIN D3) 2000 UNITS TABS Take 2,000 Units by mouth daily.    Marland Kitchen CRANBERRY PO Take 1 tablet by mouth every other day.     . diltiazem (CARDIZEM) 30 MG tablet Take 1 tablet (30 mg total) by mouth daily. (Patient taking differently: Take 30 mg by mouth at bedtime. ) 90 tablet 3  . ibandronate (BONIVA) 150 MG tablet Take 1 tablet (150 mg total) by mouth every 30 (thirty) days. Take in the morning with a full glass of water, on an empty stomach, and do not take anything else by mouth or lie down for the next 30 min.  3 tablet 4  . Multiple Vitamin (MULTIVITAMIN WITH MINERALS) TABS tablet Take 1 tablet by mouth daily.    Marland Kitchen OVER THE COUNTER MEDICATION Place 1 drop into both eyes daily. Over the counter [employer] lubricating eye drop    . Saline 0.9 % (SOLN) SOLN Place 1 drop into both nostrils daily as needed (dry nasal passages).    Marland Kitchen aspirin 81 MG tablet Take 81 mg by mouth daily. Reported on 09/07/2015    . HYDROcodone-acetaminophen (NORCO/VICODIN) 5-325 MG tablet Take 1 tablet by mouth every 6 (six) hours as needed for severe pain. (Patient not taking: Reported on 09/07/2015) 10 tablet 0   No current facility-administered medications on file prior to visit.     ONCOLOGIC FAMILY HISTORY:  Family History  Problem Relation Age of Onset  . Arrhythmia Mother     A-Fib  . Hyperlipidemia Mother   . Hypertension Mother   . Cancer Mother     breast cancer  . Cancer Sister      GENETIC COUNSELING/TESTING: No    SOCIAL HISTORY:    ANGELEENA DUEITT is widowed and lives alone in [place], [place].  She has no children. 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  She denies any current or history of tobacco, alcohol, or illicit drug use.     PHYSICAL EXAMINATION:  Vital Signs: Filed Vitals:   09/07/15 1455  BP: 114/53  Pulse: 73  Temp: 98.1 F (36.7 C)  Resp: 18   ECOG Performance Status: 1  General: Well-nourished, well-appearing female in no acute distress.  She is unaccompanied in clinic today.   HEENT: Head is atraumatic and normocephalic.  Pupils equal and reactive to light and accomodation. Conjunctivae clear without exudate.  Sclerae anicteric. Oral mucosa is pink, moist, and intact without lesions.  Oropharynx is pink without lesions or erythema.  Lymph: No cervical, supraclavicular, infraclavicular, or axillary lymphadenopathy noted on palpation.  Cardiovascular: Regular rate and rhythm without murmurs, rubs, or gallops. Respiratory: Clear to auscultation bilaterally. Chest expansion symmetric without accessory muscle use on inspiration or expiration.  GI: Abdomen soft and round. No tenderness to palpation. Bowel sounds normoactive in 4 quadrants. GU: Deferred.  Neuro: No focal deficits. Steady gait.  Psych: Mood and affect normal and appropriate for situation.  Extremities: No edema, cyanosis, or clubbing.  Skin: Warm and dry. No open lesions noted.   LABORATORY DATA:  None for this visit.  DIAGNOSTIC IMAGING:  None for this visit.     ASSESSMENT AND PLAN:   1. Breast cancer: Stage IA invasive lobular carcinoma of the left breast (06/2013), ER positive, PR positive, HER2/neu negative, S/P neoadjuvant endocrine therapy with anastrozole begun 07/2013, S/P lumpectomy 03/2015, followed by adjuvant radiation therapy to the left breast completed 06/2015 now on continued endocrine therapy with anastrozole.  Terri Ponce is doing well without clinical symptoms worrisome for disease recurrence.  She will follow-up with her medical oncologist,  Dr. Jana Hakim, in June 2017 with history and physical examination per surveillance protocol.  She will continue her anti-estrogen therapy with anastrozole at this time and was instructed to make [place] aware if she begins to experience any new or increased side effects of the medication.  A comprehensive survivorship care plan and treatment summary was reviewed with the patient today detailing her breast cancer diagnosis, treatment course, potential late/long-term effects of treatment, appropriate follow-up care with recommendations for the future, and patient education resources.  A copy of this summary, along with a  letter will be sent to the patient's primary care provider via in basket message after today's visit.  Terri Ponce is welcome to return to the Survivorship Clinic in the future, as needed; no follow-up will be scheduled at this time.    2. Bone health:  Given Terri Ponce age/history of breast cancer and her current treatment regimen including endocrine therapy with anastrozole, she is at risk for further bone demineralization.  She is already on bisphosphonate therapy, which she will continue.  We will continue to monitor this closely while she is on endocrine therapy.  In the meantime, she was encouraged to increase her consumption of foods rich in calcium and vitamin D as well as to increase her weight-bearing activities.  She was given education on specific activities to promote bone health.  3. Cancer screening:  Due to Terri Ponce's history and her age, she should receive screening for skin cancers and and gynecologic cancers.  The information and recommendations are listed on the patient's comprehensive care plan/treatment summary and were reviewed in detail with the patient.    4. Health maintenance and wellness promotion: Terri Ponce was encouraged to consume 5-7 servings of fruits and vegetables per day. We reviewed the "Nutrition Rainbow"  handout, as well as discussed recommendations to maximize nutrition and minimize recurrence, such as increased intake of fruits, vegetables, lean proteins, and minimizing the intake of red meats and processed foods.  She was also encouraged to engage in moderate to vigorous exercise for 30 minutes per day most days of the week.  She is participating in the [employer] program and enjoying it tremendously.  She was instructed to limit her alcohol consumption and continue to abstain from tobacco use.  A copy of the "Take Control of Your Health" brochure was given to her reinforcing these recommendations.   5. Support services/counseling: It is not uncommon for this period of the patient's cancer care trajectory to be one of many emotions and stressors.  We discussed an opportunity for her to participate in the next session of [facility] ("Finding Your New Normal") support group series designed for patients after they have completed treatment.  Terri Ponce was encouraged to take advantage of our many other support services programs, support groups, and/or counseling in coping with her new life as a cancer survivor after completing anti-cancer treatment.  She was offered support today through active listening and expressive supportive counseling.  She was given information regarding our available services and encouraged to contact me with any questions or for help enrolling in any of our support group/programs.    A total of 60 minutes of face-to-face time was spent with this patient with greater than 50% of that time in counseling and care-coordination.   Sylvan Cheese, NP  Survivorship Program [facility] [phone]   Note: PRIMARY CARE PROVIDER [place], [place] [phone]

## 2015-09-14 ENCOUNTER — Telehealth: Payer: Self-pay | Admitting: Nurse Practitioner

## 2015-09-14 NOTE — Telephone Encounter (Signed)
Received voice mail from patient requesting return call.  Seen previously by this provider for SCP review on 09/07/2015.  Pt needing assistance regarding two issues.  The first is a form that she needs completed by Dr. Jana Hakim regarding her son's hardship transfer request.  He is an [employer] agent in [place] and is looking to relocate closer to be able to care for Ms. Ristine, who is widowed.  Pt has form that needs completion attesting agency of her diagnosis and that having patient's son closer in proximity would aid in her care.  Pt advised to bring form to [place].  Advised that she can drop off with Ms. Wilma at the front desk provided it is in an envelope with Dr. Virgie Dad name on it and that the form will be routed to him for completion.  Note sent to Marlon Pel RN to make her aware that form will be arriving.  The second issue concerns plans for repeat thyroid biopsy per Dr. Harlow Asa.  Ms. Nick reports that she is too fatigued from the radiation to want to undergo procedure at this time.  Advised pt to call and discuss this with Dr. Gala Lewandowsky nurse to discuss the potential of delaying it slightly to allow for more recovery from radiation.  Ms. Chesney will drop off the form with [place] and reach out to Dr. Gala Lewandowsky nurse, as above. No additional questions at this time.

## 2015-09-24 ENCOUNTER — Other Ambulatory Visit: Payer: Self-pay | Admitting: Oncology

## 2015-09-24 ENCOUNTER — Encounter: Payer: Self-pay | Admitting: Oncology

## 2015-09-27 ENCOUNTER — Telehealth: Payer: Self-pay | Admitting: *Deleted

## 2015-09-27 NOTE — Telephone Encounter (Signed)
Letter per pt's request for son to be relocated to local [employer] office put in mail to Nancy Nordmann and copy mailed to pt.

## 2015-09-30 DIAGNOSIS — D225 Melanocytic nevi of trunk: Secondary | ICD-10-CM | POA: Diagnosis not present

## 2015-09-30 DIAGNOSIS — L814 Other melanin hyperpigmentation: Secondary | ICD-10-CM | POA: Diagnosis not present

## 2015-09-30 DIAGNOSIS — D1801 Hemangioma of skin and subcutaneous tissue: Secondary | ICD-10-CM | POA: Diagnosis not present

## 2015-09-30 DIAGNOSIS — L304 Erythema intertrigo: Secondary | ICD-10-CM | POA: Diagnosis not present

## 2015-09-30 DIAGNOSIS — D485 Neoplasm of uncertain behavior of skin: Secondary | ICD-10-CM | POA: Diagnosis not present

## 2015-09-30 DIAGNOSIS — D2272 Melanocytic nevi of left lower limb, including hip: Secondary | ICD-10-CM | POA: Diagnosis not present

## 2015-09-30 DIAGNOSIS — L821 Other seborrheic keratosis: Secondary | ICD-10-CM | POA: Diagnosis not present

## 2015-09-30 DIAGNOSIS — D2262 Melanocytic nevi of left upper limb, including shoulder: Secondary | ICD-10-CM | POA: Diagnosis not present

## 2015-09-30 DIAGNOSIS — D2261 Melanocytic nevi of right upper limb, including shoulder: Secondary | ICD-10-CM | POA: Diagnosis not present

## 2015-10-05 ENCOUNTER — Ambulatory Visit (INDEPENDENT_AMBULATORY_CARE_PROVIDER_SITE_OTHER): Payer: Medicare PPO | Admitting: Cardiovascular Disease

## 2015-10-05 ENCOUNTER — Encounter: Payer: Self-pay | Admitting: Cardiovascular Disease

## 2015-10-05 VITALS — BP 120/70 | HR 68 | Ht 66.0 in | Wt 161.8 lb

## 2015-10-05 DIAGNOSIS — I480 Paroxysmal atrial fibrillation: Secondary | ICD-10-CM | POA: Diagnosis not present

## 2015-10-05 NOTE — Patient Instructions (Signed)

## 2015-10-05 NOTE — Progress Notes (Signed)
Cardiology Office Note   Date:  10/05/2015   ID:  Terri Ponce, DOB Aug 11, 1928, MRN [id number]  PCP:  Mathews Argyle, MD  Cardiologist:  Mertie Moores, MD   Chief Complaint  Patient presents with  . Follow-up   1. Paroxysmal atrial fibrillation 2. Thyroid nodules 3. Hx of Hypertension  History of Present Illness:  Terri Ponce is an 82 year old female ([name]) who is seen today for further evaluation of paroxysmal atrial fibrillation. She has had smptoms for years - palps off and on for years. The palpitations are sporatic. She thinks they may be related to stress. She has a sensation in her left ribs. She is able to push through the palpitations and denies any CP or dyspnea associated with the arrhythmia. These palpitatioins   Recently she had some dyspnea at the beach and last week when she went to see Dr. Felipa Eth. She seems to have some slight dizziness and unsteadiness recently.   She has had HTN in the past - resolved with a better diet and exercise and weight loss program. BP now is ok.  She goes to the [employer] sne[employer] program And measures her BP several times a week. She has been having night sweats for the past several years.   Dec. 3, 2014:  Terri Ponce is seen today with her daughter Terri Ponce). She has tapered her Diltiazem 30 mg a day because of dizziness and nausea. We also tried slow release Cardizem but she had dizziness and nausea with that as well.   October 31, 2013:  Terri Ponce is doing well. Has a few palpitations - no prolonged episodes of Afib  04/01/2014:  Terri Ponce is seen today for a followup of her paroxysmal atrial fibrillation. She also has a history of hypertension. She has been depressed since 16-Feb-2023 after her husband died. She has had lots of burning and tingling sensation in her legs - especially at night.  She was seen by Christell Faith in the [place] office. Arterial ( ABI ) was normal. Labs were  unremarkable.   She finds that her peripheral her neuropathy symptoms would typically resolve if she moves her legs around in a bicycle type pattern.   Sep 21, 2014:  : Terri Ponce is a 82 y.o. female who presents for her hypertension and atrial fibrillation.  She is doing well. She has had some palpitations at night.  Lasted only a few minutes.  Took several deep breaths and felt better.  Turned over and felt better. Has had some nose bleeds in January.  No further episodes since .   Oct 05, 2015:  Doing well.  HR and BP are well controlled. Occasionally has palpitations .  Only transient ,  Not associated with any worsening of the dyspnea Palpitations are very brief.      Past Medical History  Diagnosis Date  . A-fib ([place])   . History of blood transfusion 1956    "related to the SALPINGOOPHORECTOMY"  . Hypertension   . Shortness of breath dyspnea     with exertion or excitement  . Multiple thyroid nodules     "very small; dx'd w/carotid doppler study"  . Anemia   . Hepatitis A   . PONV (postoperative nausea and vomiting) 1956    none since  . Walking pneumonia X 1    "self dx'd"  . Scintillating scotoma of right eye dx'd 02/2015  . Arthritis     "stiffness and soreness in my joints; particularly knee  joints and fingers" (04/08/2015)  . Anxiety   . Depression     still grieving husband's death from 02-16-14  . Cancer of left breast ([place]) dx'd 06/2013    Past Surgical History  Procedure Laterality Date  . Salpingoophorectomy Right 1956  . Cataract extraction w/ intraocular lens implant Right   . Wrist fracture surgery Left July 2010    titanium  . Tonsillectomy and adenoidectomy    . Appendectomy  1956  . Fracture surgery    . Breast biopsy Left 07/16/2013  . Breast lumpectomy Left 04/08/2015    WITH RADIOACTIVE SEED LOCALIZATION   . Squamous cell carcinoma excision   09/2014    "forehead"  . Basal cell carcinoma excision   09/2014    "forehead"  .  Breast lumpectomy with radioactive seed localization Left 04/08/2015    Procedure: BREAST LUMPECTOMY WITH RADIOACTIVE SEED LOCALIZATION;  Surgeon: Rolm Bookbinder, MD;  Location: [facility];  Service: General;  Laterality: Left;     Current Outpatient Prescriptions  Medication Sig Dispense Refill  . acetaminophen (TYLENOL) 325 MG tablet Take 325 mg by mouth daily as needed (pain).    Marland Kitchen anastrozole (ARIMIDEX) 1 MG tablet Take 1 tablet (1 mg total) by mouth daily. (Patient taking differently: Take 1 mg by mouth See admin instructions. Take 1 tablet (1 mg) by mouth at midnight or 1am) 90 tablet 0  . apixaban (ELIQUIS) 5 MG TABS tablet Take 1 tablet (5 mg total) by mouth 2 (two) times daily. (Patient taking differently: Take 5 mg by mouth every evening. ) 180 tablet 3  . aspirin 81 MG tablet Take 81 mg by mouth daily. Reported on 09/07/2015    . Calcium-Magnesium 500-250 MG TABS Take 1 tablet by mouth daily.    . Calcium-Magnesium-Vitamin D 600-40-500 MG-MG-UNIT TB24 Take 1,200 mEq by mouth.    . Cholecalciferol (VITAMIN D3) 2000 UNITS TABS Take 2,000 Units by mouth daily.    Marland Kitchen CRANBERRY PO Take 1 tablet by mouth every other day.     . diltiazem (CARDIZEM) 30 MG tablet Take 1 tablet (30 mg total) by mouth daily. (Patient taking differently: Take 30 mg by mouth at bedtime. ) 90 tablet 3  . HYDROcodone-acetaminophen (NORCO/VICODIN) 5-325 MG tablet Take 1 tablet by mouth every 6 (six) hours as needed for severe pain. 10 tablet 0  . ibandronate (BONIVA) 150 MG tablet Take 1 tablet (150 mg total) by mouth every 30 (thirty) days. Take in the morning with a full glass of water, on an empty stomach, and do not take anything else by mouth or lie down for the next 30 min. 3 tablet 4  . Multiple Vitamin (MULTIVITAMIN WITH MINERALS) TABS tablet Take 1 tablet by mouth daily.    Marland Kitchen OVER THE COUNTER MEDICATION Place 1 drop into both eyes daily. Over the counter [employer] lubricating eye drop    . Saline 0.9 % (SOLN)  SOLN Place 1 drop into both nostrils daily as needed (dry nasal passages).     No current facility-administered medications for this visit.    Allergies:   Sulfonamide derivatives; Other; and Amoxicillin    Social History:  The patient  reports that she has never smoked. She has never used smokeless tobacco. She reports that she does not drink alcohol or use illicit drugs.   Family History:  The patient's family history includes Arrhythmia in her mother; Cancer in her mother and sister; Hyperlipidemia in her mother; Hypertension in her mother.  ROS:  Please see the history of present illness.    Review of Systems: Constitutional:  denies fever, chills, diaphoresis, appetite change and fatigue.  HEENT: denies photophobia, eye pain, redness, hearing loss, ear pain, congestion, sore throat, rhinorrhea, sneezing, neck pain, neck stiffness and tinnitus.  Respiratory: denies SOB, DOE, cough, chest tightness, and wheezing.  Cardiovascular: denies chest pain, palpitations and leg swelling.  Gastrointestinal: denies nausea, vomiting, abdominal pain, diarrhea, constipation, blood in stool.  Genitourinary: denies dysuria, urgency, frequency, hematuria, flank pain and difficulty urinating.  Musculoskeletal: denies  myalgias, back pain, joint swelling, arthralgias and gait problem.   Skin: denies pallor, rash and wound.  Neurological: denies dizziness, seizures, syncope, weakness, light-headedness, numbness and headaches.   Hematological: denies adenopathy, easy bruising, personal or family bleeding history.  Psychiatric/ Behavioral: denies suicidal ideation, mood changes, confusion, nervousness, sleep disturbance and agitation.       All other systems are reviewed and negative.    PHYSICAL EXAM: VS:  BP 120/70 mmHg  Pulse 68  Ht 5\' 6"  (1.676 m)  Wt 161 lb 12.8 oz (73.392 kg)  BMI 26.13 kg/m2 , BMI Body mass index is 26.13 kg/(m^2). GEN: Well nourished, well developed, in no acute  distress HEENT: normal Neck: no JVD, carotid bruits, or masses Cardiac: RRR; no murmurs, rubs, or gallops,no edema  Respiratory:  clear to auscultation bilaterally, normal work of breathing GI: soft, nontender, nondistended, + BS MS: no deformity or atrophy Skin: warm and dry, no rash Neuro:  Strength and sensation are intact Psych: normal   EKG:  EKG is not ordered today. The ekg ordered today demonstrates    Recent Labs: 07/20/2015: ALT 14; BUN 22.8; Creatinine 1.0; HGB 12.8; Platelets 233; Potassium 3.8; Sodium 141    Lipid Panel No results found for: CHOL, TRIG, HDL, CHOLHDL, VLDL, LDLCALC, LDLDIRECT    Wt Readings from Last 3 Encounters:  10/05/15 161 lb 12.8 oz (73.392 kg)  09/07/15 164 lb 8 oz (74.617 kg)  08/05/15 163 lb 9.6 oz (74.208 kg)      Other studies Reviewed: Additional studies/ records that were reviewed today include: . Review of the above records demonstrates:    ASSESSMENT AND PLAN:  1. Paroxysmal atrial fibrillation- is maintaining NSR , doing well.   Will see her in 1 year.  2. Thyroid nodules  3. Hx of Hypertension -  BP is well controlloled.  4. Recent tick bite:   Was recently Found have a deer tick on her left thigh. She had a bull's-eye rash on her right leg. Terri Ponce and her daughter Terri Ponce had questions  about Lyme disease evaluation.  Current medicines are reviewed at length with the patient today.  The patient has concerns regarding medicines.  The following changes have been made:  no change  Labs/ tests ordered today include:  No orders of the defined types were placed in this encounter.     Disposition:   FU with me in 1 year      Mertie Moores, MD  10/05/2015 4:39 PM    [address], [place], [place]  [address] Phone: [phone]; Fax: [phone]   [facility]  [address] [place], [place]  [address] [phone]    Fax [phone]

## 2015-10-14 ENCOUNTER — Other Ambulatory Visit: Payer: Self-pay

## 2015-10-14 DIAGNOSIS — C50212 Malignant neoplasm of upper-inner quadrant of left female breast: Secondary | ICD-10-CM

## 2015-10-15 ENCOUNTER — Other Ambulatory Visit (HOSPITAL_BASED_OUTPATIENT_CLINIC_OR_DEPARTMENT_OTHER): Payer: Medicare PPO

## 2015-10-15 DIAGNOSIS — C50312 Malignant neoplasm of lower-inner quadrant of left female breast: Secondary | ICD-10-CM | POA: Diagnosis not present

## 2015-10-15 DIAGNOSIS — C50212 Malignant neoplasm of upper-inner quadrant of left female breast: Secondary | ICD-10-CM

## 2015-10-15 LAB — COMPREHENSIVE METABOLIC PANEL
ALT: 10 U/L (ref 0–55)
ANION GAP: 8 meq/L (ref 3–11)
AST: 14 U/L (ref 5–34)
Albumin: 3.9 g/dL (ref 3.5–5.0)
Alkaline Phosphatase: 79 U/L (ref 40–150)
BILIRUBIN TOTAL: 0.32 mg/dL (ref 0.20–1.20)
BUN: 19.9 mg/dL (ref 7.0–26.0)
CALCIUM: 9.5 mg/dL (ref 8.4–10.4)
CO2: 26 mEq/L (ref 22–29)
Chloride: 104 mEq/L (ref 98–109)
Creatinine: 0.8 mg/dL (ref 0.6–1.1)
EGFR: 65 mL/min/{1.73_m2} — AB (ref >90)
Glucose: 102 mg/dl (ref 70–140)
Potassium: 4.3 mEq/L (ref 3.5–5.1)
Sodium: 138 mEq/L (ref 136–145)
TOTAL PROTEIN: 6.4 g/dL (ref 6.4–8.3)

## 2015-10-15 LAB — CBC WITH DIFFERENTIAL/PLATELET
BASO%: 0.8 % (ref 0.0–2.0)
Basophils Absolute: 0.1 10*3/uL (ref 0.0–0.1)
EOS ABS: 0.3 10*3/uL (ref 0.0–0.5)
EOS%: 3.5 % (ref 0.0–7.0)
HEMATOCRIT: 38.1 % (ref 34.8–46.6)
HGB: 12.6 g/dL (ref 11.6–15.9)
LYMPH#: 1.9 10*3/uL (ref 0.9–3.3)
LYMPH%: 20.4 % (ref 14.0–49.7)
MCH: 29.6 pg (ref 25.1–34.0)
MCHC: 33 g/dL (ref 31.5–36.0)
MCV: 89.6 fL (ref 79.5–101.0)
MONO#: 0.7 10*3/uL (ref 0.1–0.9)
MONO%: 8.2 % (ref 0.0–14.0)
NEUT%: 67.1 % (ref 38.4–76.8)
NEUTROS ABS: 6.1 10*3/uL (ref 1.5–6.5)
PLATELETS: 227 10*3/uL (ref 145–400)
RBC: 4.25 10*6/uL (ref 3.70–5.45)
RDW: 13.6 % (ref 11.2–14.5)
WBC: 9.1 10*3/uL (ref 3.9–10.3)

## 2015-10-19 IMAGING — US US SOFT TISSUE HEAD/NECK
1 series · 14 of 25 positions shown · non-contrast
Comparison: None.

CLINICAL DATA: Thyroid nodule.  Initial visit.

EXAM:
THYROID ULTRASOUND
TECHNIQUE: Ultrasound examination of the thyroid gland and adjacent soft
tissues was performed.

[Series 1: us soft tissue head/neck · 0.06mm/px · 14 of 44 slices shown]
[im 1/44]
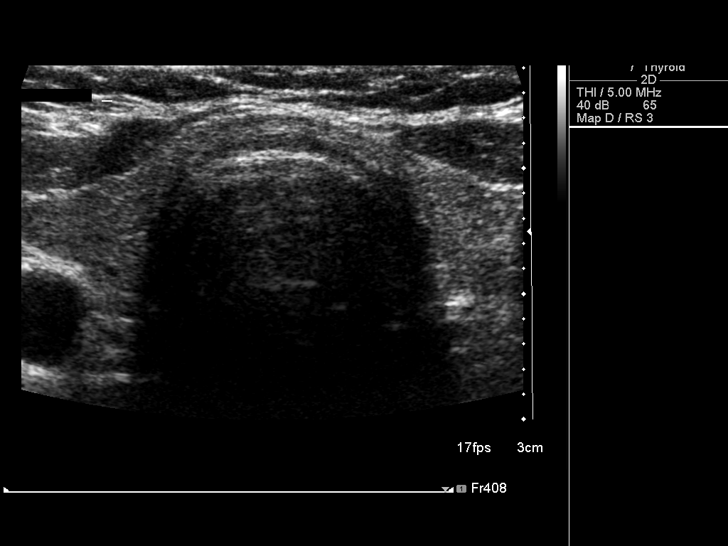
[im 4/44]
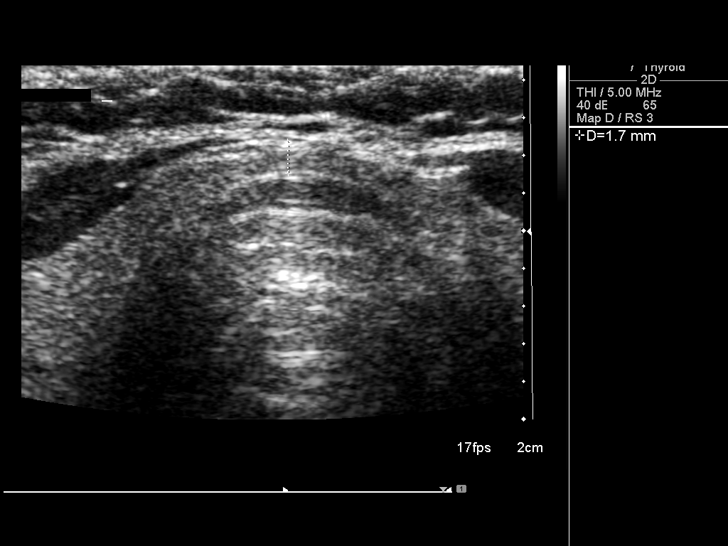
[im 8/44]
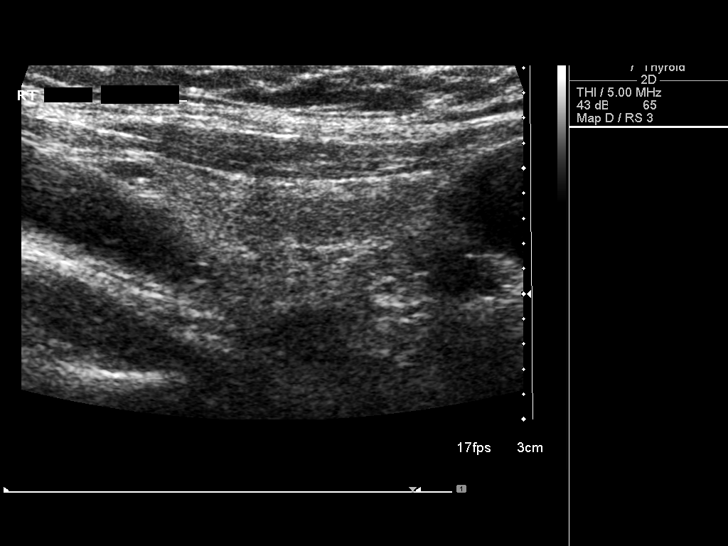
[im 11/44]
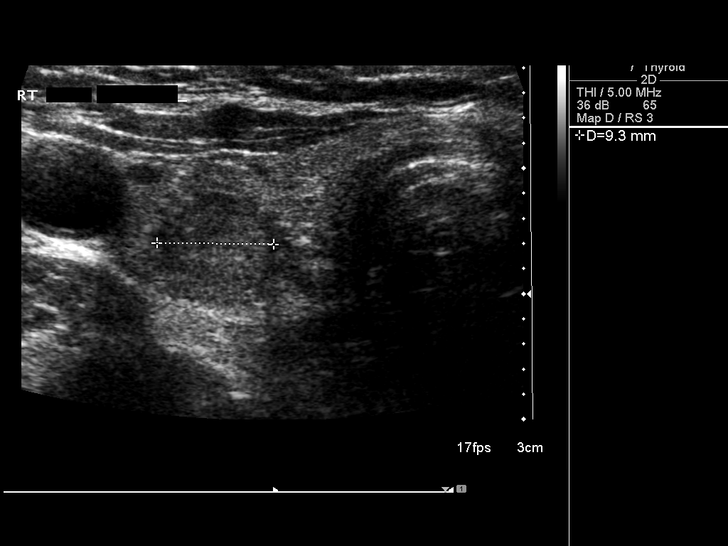
[im 15/44]
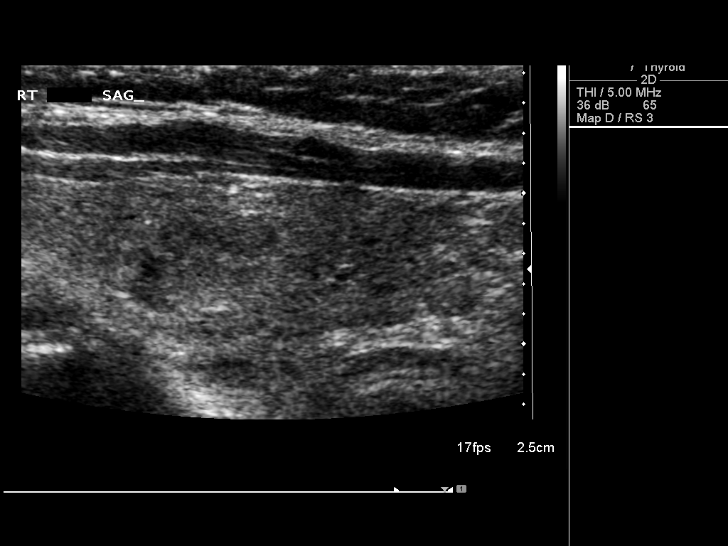
[im 17/44]
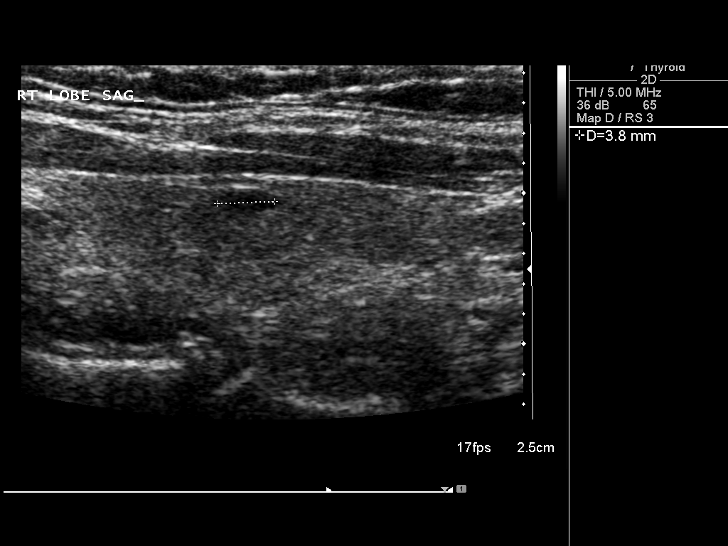
[im 20/44]
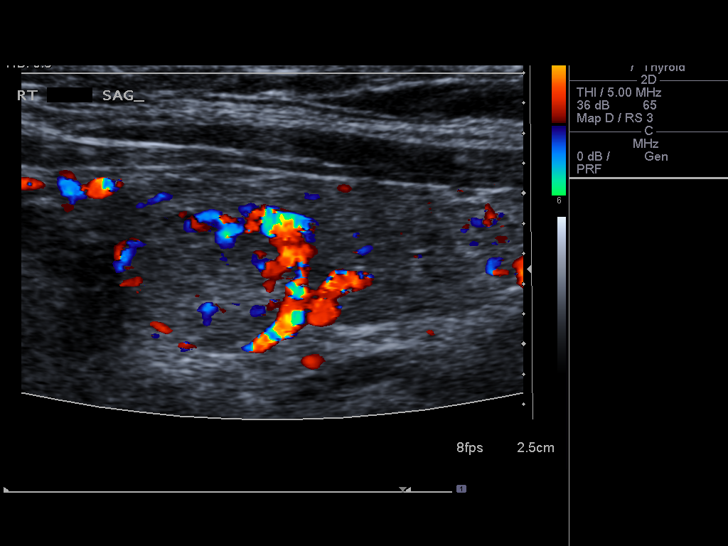
[im 24/44]
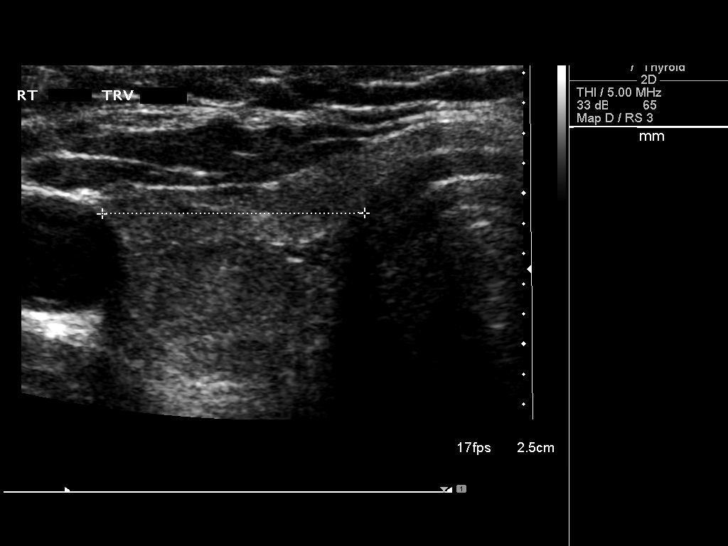
[im 27/44]
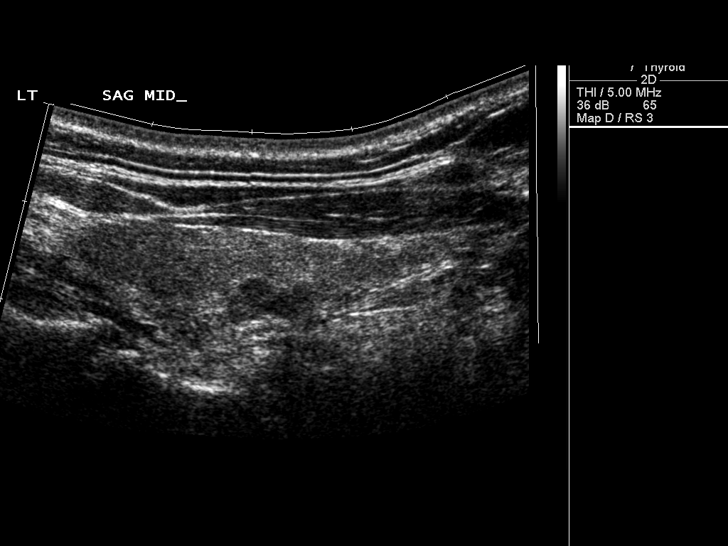
[im 29/44]
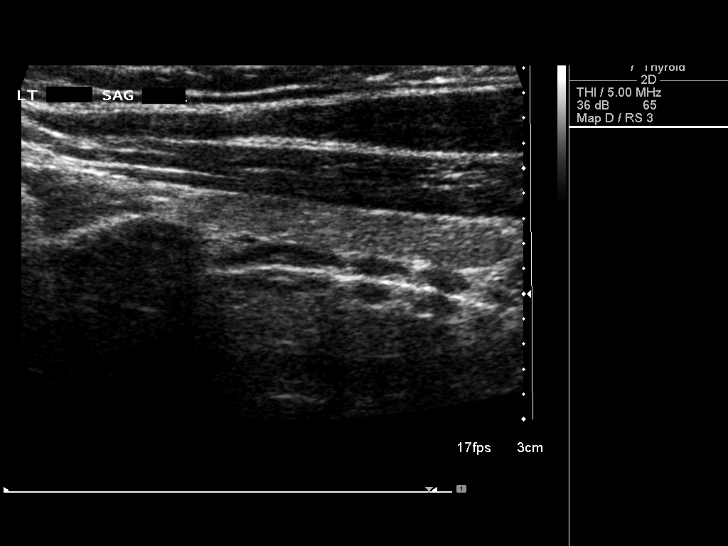
[im 33/44]
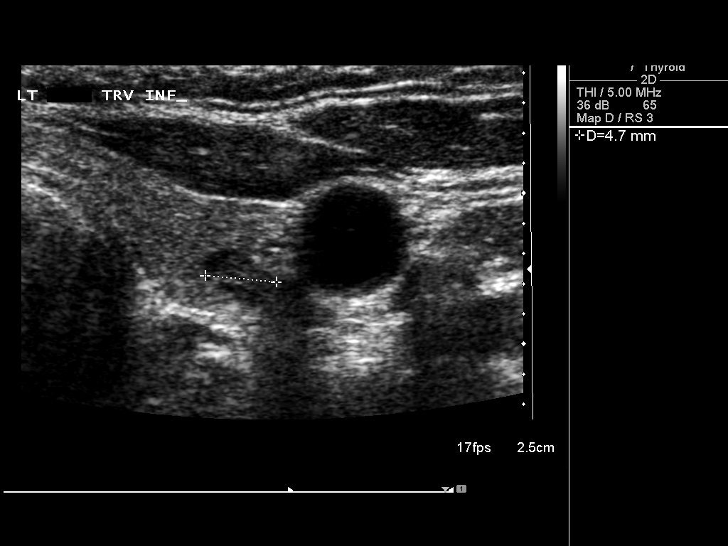
[im 36/44]
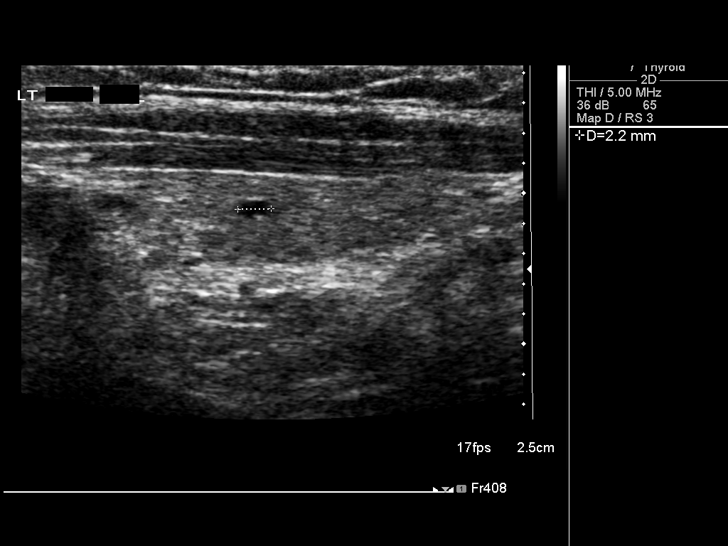
[im 40/44]
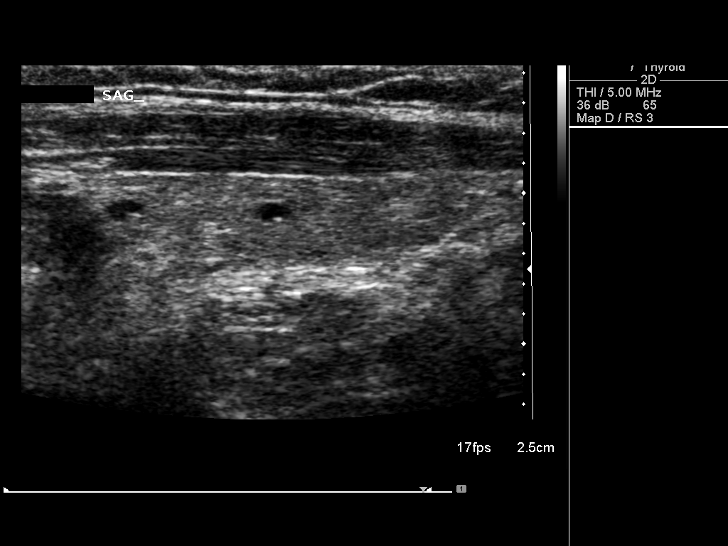
[im 44/44]
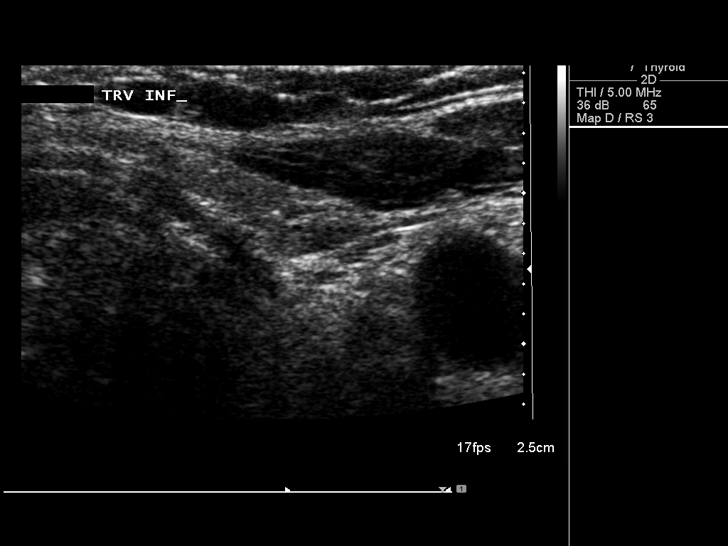

[14 of 25 positions shown; findings below may reference images not displayed]

FINDINGS: Right thyroid lobe

Measurements: 4.2 x 1.4 x 1.7 cm. Heterogeneous tissue. 1.3 x 1.0 x
0.9 cm solid isoechoic nodule posteriorly. Other smaller nodules are
present.

Left thyroid lobe

Measurements: 4.6 x 1.6 x 1.1 cm. Heterogeneous gland. 7 mm lower
pole hypoechoic nodule. Other smaller upper pole nodules.

Isthmus

Thickness: 2 mm.  No nodules visualized.

Lymphadenopathy

None visualized.
IMPRESSION: Small bilateral nodules. The dominant nodule is 1.3 cm in the right
lobe. Findings do not meet current SRU consensus criteria for
biopsy. Follow-up by clinical exam is recommended. If patient has
known risk factors for thyroid carcinoma, consider follow-up
ultrasound in 12 months. If patient is clinically hyperthyroid,
consider nuclear medicine thyroid uptake and scan.Reference:
Management of Thyroid Nodules Detected at US: Society of
Radiologists in Ultrasound Consensus Conference Statement. Radiology

## 2015-10-21 ENCOUNTER — Telehealth: Payer: Self-pay | Admitting: Oncology

## 2015-10-21 ENCOUNTER — Ambulatory Visit (HOSPITAL_BASED_OUTPATIENT_CLINIC_OR_DEPARTMENT_OTHER): Payer: Medicare PPO | Admitting: Oncology

## 2015-10-21 VITALS — BP 131/65 | HR 82 | Temp 98.0°F | Resp 18 | Ht 66.0 in | Wt 164.9 lb

## 2015-10-21 DIAGNOSIS — C50212 Malignant neoplasm of upper-inner quadrant of left female breast: Secondary | ICD-10-CM

## 2015-10-21 DIAGNOSIS — M810 Age-related osteoporosis without current pathological fracture: Secondary | ICD-10-CM

## 2015-10-21 DIAGNOSIS — Z170 Estrogen receptor positive status [ER+]: Secondary | ICD-10-CM

## 2015-10-21 DIAGNOSIS — E041 Nontoxic single thyroid nodule: Secondary | ICD-10-CM | POA: Diagnosis not present

## 2015-10-21 DIAGNOSIS — C50312 Malignant neoplasm of lower-inner quadrant of left female breast: Secondary | ICD-10-CM

## 2015-10-21 DIAGNOSIS — C50912 Malignant neoplasm of unspecified site of left female breast: Secondary | ICD-10-CM | POA: Diagnosis not present

## 2015-10-21 NOTE — Telephone Encounter (Signed)
appt made and avs printed °

## 2015-10-21 NOTE — Progress Notes (Signed)
[place]  [phone] [phone]) [phone]     ID: [name] OB: 12-Jan-1929  MR#: [id number]  RXV#:[id number]  PCP: Mathews Argyle, MD GYN:   SU: Rolm Bookbinder, Armandina Gemma OTHER MD: Thea Silversmith  CHIEF COMPLAINT: Locally advanced left breast cancer  CURRENT TREATMENT: Anastrozole   BREAST CANCER HISTORY: From the original intake Notes 08/13/2013:   Ms. Weyand had screening mammography December of 2012 showing a low density oval nodule at the 9:00 position which was not palpable and which by ultrasound appeared to be a cyst. 6 month reevaluation along the 11/29/2011 showed a small nodule to have entirely resolved. Return to screening mammography was recommended.  However in January of 2015 the patient noted a change in her left breast which he initially felt was due to refer her bra rubbing on her skin. She was set up for bilateral diagnostic mammography and left breast ultrasonography at the breast center 06/20/2013. There was an asymmetry in the left breast area of palpable concern in exam showed a firm raised red area of spanning 3 cm along the far medial left breast inframammary fold. Ultrasound confirmed an area of skin thickening in ill-defined change measuring 1.7 cm. This extended to the skin. It was felt to be possibly due to an infected sebaceous cyst, and the patient was given antibiotics for a week  On 07/04/2013 repeat ultrasound of the left breast showed an irregular shadowing mass at the 8:00 position measuring 1.6 cm. The left axilla showed no lymphadenopathy. Physical exam showed a firm mass in this area. Biopsy was recommended and the patient's Rivaroxaban was temporarily stopped. On 07/16/2013, biopsy of the left breast 8:00 area in question showed ([id number]) and invasive lobular carcinoma (E-cadherin negative), grade 1, estrogen receptor 100% positive, progesterone receptor 74% positive, with an MIB-1 of 16% and no HER-2  amplification, the signals ratio being 1.29 and the number per cell 2.25.  On 07/25/2013 the patient underwent bilateral breast MRI. This showed in the lower inner quadrant of the left breast in the inframammary fold a 3.2 cm irregular enhancing mass containing a biopsy clip. Some spicules of enhancement extended posteriorly to the liver of the pectoralis. The mass abuts and appears to involve overlying skin. In addition, a 9 mm separate area of nodular enhancement was noted anterior and medial to the biopsied area. There were 2 left internal mammary lymph nodes measuring 4 mm and 2 mm. There was also a morphologically abnormal 1 cm lymph node in the left axilla. Incidental finding in the liver noted multiple masses favored to represent cysts.  On 08/04/2013 the patient underwent biopsy of the suspicious left axillary lymph node and this showed ([id number]) no evidence of malignancy. This was felt to be "possibly not concordant". Repeat MRI in 6 months (assuming neoadjuvant treatment) was recommended.  The patient has met with Dr. Donne Hazel and Dr. Pablo Ledger. Dr. Donne Hazel have set the patient up for biopsy of the satellite lesion in the left breast. She feels the patient would benefit from neoadjuvant endocrine therapy. Dr. Pablo Ledger feels the patient will need radiation, even if she eventually ends up having a mastectomy.  The patient's subsequent history is as detailed below.  INTERVAL HISTORY: "Avanni Turnbaugh" returns today for followup of her estrogen receptor positive breast cancer accompanied by her daughter [name]. Radonna Bracher continues on anastrozole, generally with good tolerance. Hot flashes and night sweats are now less intense and less frequent. She does get some chills at night probably  because of the sweats.  She started ibandronate the first Saturday of March and takes at the first Saturday of each month. First dose was fine, second dose she had a lot of cramps and pains which later flat on her  back for 2 days. After that she started taking Tums 2 tablets twice daily and 4 tablets in the morning of [place] and for the main dose she had absolutely no problems. Her fourth dose is due this Saturday of course  REVIEW OF SYSTEMS: Theodis Sato afterwards. She is continuing with Silver sneakers. She got a couple of dear takes, developed a target lesion and was started on doxycycline for 2 weeks. This made her very tired, made her head feels like it was going to blow off, but in any case she completed the 2 weeks and now is feeling better. Aside from these issues a detailed review of systems today was noncontributory  PAST MEDICAL HISTORY: Past Medical History  Diagnosis Date  . A-fib ([place])   . History of blood transfusion 1956    "related to the SALPINGOOPHORECTOMY"  . Hypertension   . Shortness of breath dyspnea     with exertion or excitement  . Multiple thyroid nodules     "very small; dx'd w/carotid doppler study"  . Anemia   . Hepatitis A   . PONV (postoperative nausea and vomiting) 1956    none since  . Walking pneumonia X 1    "self dx'd"  . Scintillating scotoma of right eye dx'd 02/2015  . Arthritis     "stiffness and soreness in my joints; particularly knee joints and fingers" (04/08/2015)  . Anxiety   . Depression     still grieving husband's death from 02/23/14  . Cancer of left breast ([place]) dx'd 06/2013   possible history of TIAs  PAST SURGICAL HISTORY: Past Surgical History  Procedure Laterality Date  . Salpingoophorectomy Right 1956  . Cataract extraction w/ intraocular lens implant Right   . Wrist fracture surgery Left July 2010    titanium  . Tonsillectomy and adenoidectomy    . Appendectomy  1956  . Fracture surgery    . Breast biopsy Left 07/16/2013  . Breast lumpectomy Left 04/08/2015    WITH RADIOACTIVE SEED LOCALIZATION   . Squamous cell carcinoma excision   09/2014    "forehead"  . Basal cell carcinoma excision   09/2014    "forehead"  . Breast  lumpectomy with radioactive seed localization Left 04/08/2015    Procedure: BREAST LUMPECTOMY WITH RADIOACTIVE SEED LOCALIZATION;  Surgeon: Rolm Bookbinder, MD;  Location: [facility];  Service: General;  Laterality: Left;    FAMILY HISTORY Family History  Problem Relation Age of Onset  . Arrhythmia Mother     A-Fib  . Hyperlipidemia Mother   . Hypertension Mother   . Cancer Mother     breast cancer  . Cancer Sister    the patient's father died at the age of 57, from complications of asbestosis. The patient's mother died at the age of 35. She had been diagnosed with breast cancer a few years before. The patient had no brothers, one sister. His sister died at age 75 from cancer which was metastatic but the patient does not know the primary  GYNECOLOGIC HISTORY:  Menarche age 66, first live birth age 7, the patient is [place] P2. She went through menopause at around age 68 or 5. She did not take hormone replacement. She took birth control pills for some years  without any clotting complications  SOCIAL HISTORY:  Semira Stoltzfus has a Masters degree in public health, specifically nutrition. Her husband Merrilee Seashore was an Chief Financial Officer and he worked for [employer] division. Daughter Crystalyn Delia (goes by "[name]") is a Architect with the [employer].  Son lives in [place] worry works as an Associate Professor. The patient has 2 grandchildren. She is a [employer]    ADVANCED DIRECTIVES:  in place; the patient's daughter is her healthcare power of attorney   HEALTH MAINTENANCE: Social History  Substance Use Topics  . Smoking status: Never Smoker   . Smokeless tobacco: Never Used  . Alcohol Use: No     Colonoscopy: Remote  PAP: remote  Bone density: 2014; "good"   Lipid panel:  Allergies  Allergen Reactions  . Sulfonamide Derivatives Other (See Comments)    Extreme anxiety  . Other Rash    Shingles vaccine  . Amoxicillin Other (See Comments)    abdominal pain    Current Outpatient  Prescriptions  Medication Sig Dispense Refill  . acetaminophen (TYLENOL) 325 MG tablet Take 325 mg by mouth daily as needed (pain).    Marland Kitchen anastrozole (ARIMIDEX) 1 MG tablet Take 1 tablet (1 mg total) by mouth daily. (Patient taking differently: Take 1 mg by mouth See admin instructions. Take 1 tablet (1 mg) by mouth at midnight or 1am) 90 tablet 0  . apixaban (ELIQUIS) 5 MG TABS tablet Take 1 tablet (5 mg total) by mouth 2 (two) times daily. (Patient taking differently: Take 5 mg by mouth every evening. ) 180 tablet 3  . aspirin 81 MG tablet Take 81 mg by mouth daily. Reported on 09/07/2015    . Calcium-Magnesium 500-250 MG TABS Take 1 tablet by mouth daily.    . Calcium-Magnesium-Vitamin D 600-40-500 MG-MG-UNIT TB24 Take 1,200 mEq by mouth.    . Cholecalciferol (VITAMIN D3) 2000 UNITS TABS Take 2,000 Units by mouth daily.    Marland Kitchen CRANBERRY PO Take 1 tablet by mouth every other day.     . diltiazem (CARDIZEM) 30 MG tablet Take 1 tablet (30 mg total) by mouth daily. (Patient taking differently: Take 30 mg by mouth at bedtime. ) 90 tablet 3  . HYDROcodone-acetaminophen (NORCO/VICODIN) 5-325 MG tablet Take 1 tablet by mouth every 6 (six) hours as needed for severe pain. 10 tablet 0  . ibandronate (BONIVA) 150 MG tablet Take 1 tablet (150 mg total) by mouth every 30 (thirty) days. Take in the morning with a full glass of water, on an empty stomach, and do not take anything else by mouth or lie down for the next 30 min. 3 tablet 4  . Multiple Vitamin (MULTIVITAMIN WITH MINERALS) TABS tablet Take 1 tablet by mouth daily.    Marland Kitchen OVER THE COUNTER MEDICATION Place 1 drop into both eyes daily. Over the counter [employer] lubricating eye drop    . Saline 0.9 % (SOLN) SOLN Place 1 drop into both nostrils daily as needed (dry nasal passages).     No current facility-administered medications for this visit.    OBSERVATION: Elderly white woman Who appears stated age 25 Vitals:   10/21/15 1433  BP: 131/65   Pulse: 82  Temp: 98 F (36.7 C)  Resp: 18     Body mass index is 26.63 kg/(m^2).    ECOG FS:1 - Symptomatic but completely ambulatory  Sclerae unicteric, EOMs intact Oropharynx clear and moist No cervical or supraclavicular adenopathy Lungs no rales or rhonchi Heart regular rate  and rhythm Abd soft, nontender, positive bowel sounds MSK no focal spinal tenderness, no upper extremity lymphedema Neuro: nonfocal, well oriented, appropriate affect Breasts: the right breast is unremarkable. The left breast is s/p lumpectomy and radiation. There is no evidence of local recurrence. The left axilla is benign.   LAB RESULTS:  CMP     Component Value Date/Time   NA 138 10/15/2015 1455   NA 138 04/05/2015 1305   NA 139 03/09/2014 1625    I No results found for: SPEP  Lab Results  Component Value Date   WBC 9.1 10/15/2015      Chemistry      Component Value Date/Time   NA 138 10/15/2015 1455   NA 138 04/05/2015 1305   NA 139 03/09/2014 1625      Component Value Date/Time   CALCIUM 9.5 10/15/2015 1455   CALCIUM 10.5* 04/05/2015 1305   CALCIUM 8.8 03/09/2014 1625       No results found for: LABCA2  No components found for: LABCA125  No results for input(s): INR in the last 168 hours.  Urinalysis No results found for: COLORURINE  STUDIES: No results found.  ASSESSMENT: 82 y.o. [place] woman status post left breast biopsy 07/16/2013 for a clinically multifocal T2 N0-3 (stage IIA-IIIC) invasive lobular breast cancer, estrogen receptor 100% positive, progesterone receptor 74% positive, with an MIB-1 of 16% and no HER-2 amplification  (1) biopsy of a suspicious left axillary lymph node 08/04/2013 was negative, but possibly discordant  (2) the patient has 2 suspicious left internal mammary lymph nodes as well as some indeterminate liver lesions which were negative on PET scan 08/26/2013 and by repeat breast MRI are c/w cysts  (3) MRI biopsy of a satellite lesion in  the left breast 08/15/2013 showed atypical lobular hyperplasia.  (4) anastrozole started 08/13/2013  (a) breast MRI 01/20/2014 shows near complete resolution of the measurable mass  (b) breast MRI 01/27/2015 shows evidence of progression  (5) DEXA scan 03/05/2013 at [facility] showed osteoporosis with the lowest T score at -2.7  (6) left lumpectomy 04/08/2015 showed a pT1c pN0, stage IA invasive ductal carcinoma, grade 1, repeat HER-2 again negative.  (7) adjuvant radiation 05/27/2015-06/24/2015:  Left breast/ 42.72 Gy at 2.67 Gy per fraction x 16 fractions.  Left breast boost/ 10 Gy at 2 Gy per fraction x 5 fractions  (8) thyroid nodule s/p biopsy 06/01/2015 showing mild atypia ([id number])  (9 osteoporosis, with a T score of -2.7 on bone density scan 03/05/2013  (a) ibandronate started 07/21/2015   PLAN: Galileah Piggee is now 2 years into her 5 years of anastrozole, with very good tolerance. We are going to continue as before.  Now that she is on ibandronate I am hoping her next bone density will show at least stability, possibly some improvement. I will be due December 2018  She really does not want to go through with the scheduled thyroid biopsy. She feels she cannot handle it at present. Her daughter agrees. She feels it's been a long relative to get this far and did a lot of backwards.  Accordingly they are going to postpone the biopsy until next year at the earliest  I am going to see Theodis Sato in November, which will be one year out from her definitive surgery. She knows to call for any problems that may develop before that visit. Chauncey Cruel, MD   10/21/2015 2:34 PM

## 2015-10-24 DIAGNOSIS — B349 Viral infection, unspecified: Secondary | ICD-10-CM | POA: Diagnosis not present

## 2015-11-05 ENCOUNTER — Other Ambulatory Visit: Payer: Self-pay | Admitting: Oncology

## 2015-11-08 DIAGNOSIS — C50312 Malignant neoplasm of lower-inner quadrant of left female breast: Secondary | ICD-10-CM | POA: Diagnosis not present

## 2016-02-24 ENCOUNTER — Other Ambulatory Visit: Payer: Self-pay | Admitting: Oncology

## 2016-02-24 DIAGNOSIS — Z853 Personal history of malignant neoplasm of breast: Secondary | ICD-10-CM

## 2016-03-02 ENCOUNTER — Ambulatory Visit
Admission: RE | Admit: 2016-03-02 | Discharge: 2016-03-02 | Disposition: A | Discharge status: Home / Self Care | Payer: Medicare PPO | Source: Ambulatory Visit | Attending: Oncology | Admitting: Oncology

## 2016-03-02 DIAGNOSIS — C50912 Malignant neoplasm of unspecified site of left female breast: Secondary | ICD-10-CM | POA: Diagnosis not present

## 2016-03-02 DIAGNOSIS — Z853 Personal history of malignant neoplasm of breast: Secondary | ICD-10-CM

## 2016-03-23 ENCOUNTER — Ambulatory Visit (INDEPENDENT_AMBULATORY_CARE_PROVIDER_SITE_OTHER): Payer: Medicare PPO | Admitting: Cardiovascular Disease

## 2016-03-23 ENCOUNTER — Encounter: Payer: Self-pay | Admitting: Cardiovascular Disease

## 2016-03-23 VITALS — BP 134/70 | HR 59 | Ht 66.5 in | Wt 160.1 lb

## 2016-03-23 DIAGNOSIS — R9431 Abnormal electrocardiogram [ECG] [EKG]: Secondary | ICD-10-CM | POA: Diagnosis not present

## 2016-03-23 DIAGNOSIS — I2510 Atherosclerotic heart disease of native coronary artery without angina pectoris: Secondary | ICD-10-CM

## 2016-03-23 DIAGNOSIS — I480 Paroxysmal atrial fibrillation: Secondary | ICD-10-CM

## 2016-03-23 LAB — COMPREHENSIVE METABOLIC PANEL
ALBUMIN: 4.3 g/dL (ref 3.6–5.1)
ALT: 13 U/L (ref 6–29)
AST: 18 U/L (ref 10–35)
Alkaline Phosphatase: 79 U/L (ref 33–130)
BILIRUBIN TOTAL: 0.4 mg/dL (ref 0.2–1.2)
BUN: 19 mg/dL (ref 7–25)
CALCIUM: 9.6 mg/dL (ref 8.6–10.4)
CO2: 26 mmol/L (ref 20–31)
Chloride: 105 mmol/L (ref 98–110)
Creat: 0.85 mg/dL (ref 0.60–0.88)
Glucose, Bld: 87 mg/dL (ref 65–99)
POTASSIUM: 4.2 mmol/L (ref 3.5–5.3)
Sodium: 140 mmol/L (ref 135–146)
Total Protein: 6.4 g/dL (ref 6.1–8.1)

## 2016-03-23 LAB — LIPID PANEL
CHOL/HDL RATIO: 5 ratio (ref <=5.0)
CHOLESTEROL: 150 mg/dL (ref 125–200)
HDL: 30 mg/dL — AB (ref >=46)
LDL Cholesterol: 74 mg/dL (ref <130)
TRIGLYCERIDES: 229 mg/dL — AB (ref <150)
VLDL: 46 mg/dL — ABNORMAL HIGH (ref <30)

## 2016-03-23 NOTE — Progress Notes (Signed)
Cardiology Office Note   Date:  03/23/2016   ID:  Terri Ponce, DOB [birth year], MRN [id number]  PCP:  Mathews Argyle, MD  Cardiologist:  Mertie Moores, MD   Chief Complaint  Patient presents with  . Follow-up    PAF    1. Paroxysmal atrial fibrillation 2. Thyroid nodules 3. Hx of Hypertension   Previous notes:    Terri Ponce is an 82 year old female ([name]) who is seen today for further evaluation of paroxysmal atrial fibrillation. She has had smptoms for years - palps off and on for years. The palpitations are sporatic. She thinks they may be related to stress. She has a sensation in her left ribs. She is able to push through the palpitations and denies any CP or dyspnea associated with the arrhythmia. These palpitatioins   Recently she had some dyspnea at the beach and last week when she went to see Dr. Felipa Eth. She seems to have some slight dizziness and unsteadiness recently.   She has had HTN in the past - resolved with a better diet and exercise and weight loss program. BP now is ok.  She goes to the [employer] program And measures her BP several times a week. She has been having night sweats for the past several years.   Dec. 3, 2014:  Terri Ponce is seen today with her daughter Terri Ponce). She has tapered her Diltiazem 30 mg a day because of dizziness and nausea. We also tried slow release Cardizem but she had dizziness and nausea with that as well.   October 31, 2013:  Terri Ponce is doing well. Has a few palpitations - no prolonged episodes of Afib  04/01/2014:  Terri Ponce is seen today for a followup of her paroxysmal atrial fibrillation. She also has a history of hypertension. She has been depressed since 03/05/2023 after her husband died. She has had lots of burning and tingling sensation in her legs - especially at night.  She was seen by Christell Faith in the [facility] office. Arterial ( ABI ) was normal. Labs were  unremarkable.   She finds that her peripheral her neuropathy symptoms would typically resolve if she moves her legs around in a bicycle type pattern.   Sep 21, 2014:  : Terri Ponce is a 82 y.o. female who presents for her hypertension and atrial fibrillation.  She is doing well. She has had some palpitations at night.  Lasted only a few minutes.  Took several deep breaths and felt better.  Turned over and felt better. Has had some nose bleeds in January.  No further episodes since .   Oct 05, 2015:  Doing well.  HR and BP are well controlled. Occasionally has palpitations .  Only transient ,  Not associated with any worsening of the dyspnea Palpitations are very brief.   Nov. 2, 2017:  Doing well.  Rare occasions she has an unusual sensation in her chest. She does not describe his chest pain but says that that there is deftly something in her chest that feels somewhat uncomfortable. She has at least mild dementia and it's difficult for her to tell a lot of her symptoms.  Gets leg cramps if she forgets her meds  Has been going to [facility]  on occasion, 1-2 times a week     Past Medical History:  Diagnosis Date  . A-fib ([place])   . Anemia   . Anxiety   . Arthritis    "stiffness and soreness  in my joints; particularly knee joints and fingers" (04/08/2015)  . Cancer of left breast ([place]) dx'd 06/2013  . Depression    still grieving husband's death from 03-09-2014  . Hepatitis A   . History of blood transfusion 1956   "related to the SALPINGOOPHORECTOMY"  . Hypertension   . Multiple thyroid nodules    "very small; dx'd w/carotid doppler study"  . PONV (postoperative nausea and vomiting) 1956   none since  . Scintillating scotoma of right eye dx'd 02/2015  . Shortness of breath dyspnea    with exertion or excitement  . Walking pneumonia X 1   "self dx'd"    Past Surgical History:  Procedure Laterality Date  . APPENDECTOMY  1956  . BASAL CELL CARCINOMA EXCISION    09/2014   "forehead"  . BREAST BIOPSY Left 07/16/2013  . BREAST LUMPECTOMY Left 04/08/2015   WITH RADIOACTIVE SEED LOCALIZATION   . BREAST LUMPECTOMY WITH RADIOACTIVE SEED LOCALIZATION Left 04/08/2015   Procedure: BREAST LUMPECTOMY WITH RADIOACTIVE SEED LOCALIZATION;  Surgeon: Rolm Bookbinder, MD;  Location: [facility];  Service: General;  Laterality: Left;  . CATARACT EXTRACTION W/ INTRAOCULAR LENS IMPLANT Right   . FRACTURE SURGERY    . SALPINGOOPHORECTOMY Right 1956  . SQUAMOUS CELL CARCINOMA EXCISION   09/2014   "forehead"  . TONSILLECTOMY AND ADENOIDECTOMY    . WRIST FRACTURE SURGERY Left July 2010   titanium     Current Outpatient Prescriptions  Medication Sig Dispense Refill  . acetaminophen (TYLENOL) 325 MG tablet Take 325 mg by mouth daily as needed (pain).    Marland Kitchen anastrozole (ARIMIDEX) 1 MG tablet Take 1 tablet (1 mg total) by mouth daily. (Patient taking differently: Take 1 mg by mouth See admin instructions. Take 1 tablet (1 mg) by mouth at midnight or 1am) 90 tablet 0  . anastrozole (ARIMIDEX) 1 MG tablet TAKE ONE TABLET BY MOUTH ONCE DAILY 90 tablet 0  . apixaban (ELIQUIS) 5 MG TABS tablet Take 1 tablet (5 mg total) by mouth 2 (two) times daily. (Patient taking differently: Take 5 mg by mouth every evening. ) 180 tablet 3  . aspirin 81 MG tablet Take 81 mg by mouth daily. Reported on 09/07/2015    . Calcium-Magnesium 500-250 MG TABS Take 1 tablet by mouth daily.    . Calcium-Magnesium-Vitamin D 600-40-500 MG-MG-UNIT TB24 Take 1,200 mEq by mouth.    . Cholecalciferol (VITAMIN D3) 2000 UNITS TABS Take 2,000 Units by mouth daily.    Marland Kitchen CRANBERRY PO Take 1 tablet by mouth every other day.     . diltiazem (CARDIZEM) 30 MG tablet Take 1 tablet (30 mg total) by mouth daily. (Patient taking differently: Take 30 mg by mouth at bedtime. ) 90 tablet 3  . HYDROcodone-acetaminophen (NORCO/VICODIN) 5-325 MG tablet Take 1 tablet by mouth every 6 (six) hours as needed for severe pain. 10 tablet 0    . ibandronate (BONIVA) 150 MG tablet Take 1 tablet (150 mg total) by mouth every 30 (thirty) days. Take in the morning with a full glass of water, on an empty stomach, and do not take anything else by mouth or lie down for the next 30 min. 3 tablet 4  . Multiple Vitamin (MULTIVITAMIN WITH MINERALS) TABS tablet Take 1 tablet by mouth daily.    Marland Kitchen OVER THE COUNTER MEDICATION Place 1 drop into both eyes daily. Over the counter [employer] lubricating eye drop    . Saline 0.9 % (SOLN) SOLN Place 1 drop  into both nostrils daily as needed (dry nasal passages).     No current facility-administered medications for this visit.     Allergies:   Sulfonamide derivatives; Other; and Amoxicillin    Social History:  The patient  reports that she has never smoked. She has never used smokeless tobacco. She reports that she does not drink alcohol or use drugs.   Family History:  The patient's family history includes Arrhythmia in her mother; Cancer in her mother and sister; Hyperlipidemia in her mother; Hypertension in her mother.    ROS:  Please see the history of present illness.    Review of Systems: Constitutional:  denies fever, chills, diaphoresis, appetite change and fatigue.  HEENT: denies photophobia, eye pain, redness, hearing loss, ear pain, congestion, sore throat, rhinorrhea, sneezing, neck pain, neck stiffness and tinnitus.  Respiratory: denies SOB, DOE, cough, chest tightness, and wheezing.  Cardiovascular: denies chest pain, palpitations and leg swelling.  Gastrointestinal: denies nausea, vomiting, abdominal pain, diarrhea, constipation, blood in stool.  Genitourinary: denies dysuria, urgency, frequency, hematuria, flank pain and difficulty urinating.  Musculoskeletal: denies  myalgias, back pain, joint swelling, arthralgias and gait problem.   Skin: denies pallor, rash and wound.  Neurological: denies dizziness, seizures, syncope, weakness, light-headedness, numbness and headaches.    Hematological: denies adenopathy, easy bruising, personal or family bleeding history.  Psychiatric/ Behavioral: denies suicidal ideation, mood changes, confusion, nervousness, sleep disturbance and agitation.       All other systems are reviewed and negative.    PHYSICAL EXAM: VS:  BP 134/70   Pulse (!) 59   Ht 5' 6.5" (1.689 m)   Wt 160 lb 1.9 oz (72.6 kg)   BMI 25.46 kg/m  , BMI Body mass index is 25.46 kg/m. GEN: Well nourished, well developed, in no acute distress  HEENT: normal  Neck: no JVD, carotid bruits, or masses Cardiac: RRR; no murmurs, rubs, or gallops,no edema  Respiratory:  clear to auscultation bilaterally, normal work of breathing GI: soft, nontender, nondistended, + BS MS: no deformity or atrophy  Skin: warm and dry, no rash Neuro:  Strength and sensation are intact Psych: normal   EKG:  EKG is ordered today. The ekg ordered today demonstrates   Sinus brady at 59.   Sinus arrhythmia.  TWI in leads V3-V6. Marland Kitchen  The TWI is new since her last ECG      Recent Labs: 10/15/2015: ALT 10; BUN 19.9; Creatinine 0.8; HGB 12.6; Platelets 227; Potassium 4.3; Sodium 138    Lipid Panel No results found for: CHOL, TRIG, HDL, CHOLHDL, VLDL, LDLCALC, LDLDIRECT    Wt Readings from Last 3 Encounters:  03/23/16 160 lb 1.9 oz (72.6 kg)  10/21/15 164 lb 14.4 oz (74.8 kg)  10/05/15 161 lb 12.8 oz (73.4 kg)      Other studies Reviewed: Additional studies/ records that were reviewed today include: . Review of the above records demonstrates:    ASSESSMENT AND PLAN:  1. ECG abn:    She has new T-wave inversions in leads V3 - V6.   She did not have these changes at her last EKG. These are concerning for coronary artery disease. She denies any real chest pain but she does have occasional episodes where she has a funny feeling in her chest. She has some degree of dementia and it may be difficult for her to describe the chest pain.  I had long discussion with the patient  and her daughter,  [name]. I think that she would probably  be okay for an  heart cath and possible stent. I do not think I would recommend coronary artery bypass grafting as I think that she would likely want up in a long-term nursing facility after the surgery.  We will get a [place] study for further evaluation. We'll have further discussion after the results of the Myoview study.  If she needs a heart catheterization will need to stop the Eliquis and start her on aspirin.  Will see her in 3 months  2. Paroxysmal atrial fibrillation- is maintaining NSR , doing well.   Will see her in 1 year.   3. Hx of Hypertension -  BP is well controlloled.  4. .  Current medicines are reviewed at length with the patient today.  The patient has concerns regarding medicines.  The following changes have been made:  no change  Labs/ tests ordered today include:  No orders of the defined types were placed in this encounter.  Disposition:   FU with me in 1 year    Mertie Moores, MD  03/23/2016 10:05 AM    [address], [place], [place]  [address] Phone: [phone]; Fax: [phone]

## 2016-03-23 NOTE — Patient Instructions (Signed)
Medication Instructions:  Your physician recommends that you continue on your current medications as directed. Please refer to the Current Medication list given to you today.   Labwork: TODAY - complete metabolic panel, cholesterol   Testing/Procedures: Your physician has requested that you have a lexiscan myoview. For further information please visit [email or web address]. Please follow instruction sheet, as given.    Follow-Up: Your physician recommends that you schedule a follow-up appointment in: 3 months with Dr. Acie Fredrickson.   If you need a refill on your cardiac medications before your next appointment, please call your pharmacy.   Thank you for choosing [facility]! Christen Bame, RN [phone]

## 2016-03-30 ENCOUNTER — Other Ambulatory Visit (HOSPITAL_BASED_OUTPATIENT_CLINIC_OR_DEPARTMENT_OTHER): Payer: Medicare PPO

## 2016-03-30 DIAGNOSIS — C50312 Malignant neoplasm of lower-inner quadrant of left female breast: Secondary | ICD-10-CM | POA: Diagnosis not present

## 2016-03-30 DIAGNOSIS — C50212 Malignant neoplasm of upper-inner quadrant of left female breast: Secondary | ICD-10-CM

## 2016-03-30 LAB — COMPREHENSIVE METABOLIC PANEL
ALBUMIN: 4 g/dL (ref 3.5–5.0)
ALK PHOS: 106 U/L (ref 40–150)
ALT: 17 U/L (ref 0–55)
AST: 17 U/L (ref 5–34)
Anion Gap: 8 mEq/L (ref 3–11)
BILIRUBIN TOTAL: 0.39 mg/dL (ref 0.20–1.20)
BUN: 19.6 mg/dL (ref 7.0–26.0)
CO2: 28 mEq/L (ref 22–29)
CREATININE: 0.8 mg/dL (ref 0.6–1.1)
Calcium: 9.7 mg/dL (ref 8.4–10.4)
Chloride: 104 mEq/L (ref 98–109)
EGFR: 63 mL/min/{1.73_m2} — ABNORMAL LOW (ref >90)
GLUCOSE: 114 mg/dL (ref 70–140)
POTASSIUM: 3.9 meq/L (ref 3.5–5.1)
SODIUM: 140 meq/L (ref 136–145)
TOTAL PROTEIN: 7 g/dL (ref 6.4–8.3)

## 2016-03-30 LAB — CBC WITH DIFFERENTIAL/PLATELET
BASO%: 0.8 % (ref 0.0–2.0)
Basophils Absolute: 0.1 10*3/uL (ref 0.0–0.1)
EOS%: 3.4 % (ref 0.0–7.0)
Eosinophils Absolute: 0.3 10*3/uL (ref 0.0–0.5)
HCT: 40.7 % (ref 34.8–46.6)
HEMOGLOBIN: 13.4 g/dL (ref 11.6–15.9)
LYMPH#: 2 10*3/uL (ref 0.9–3.3)
LYMPH%: 24.8 % (ref 14.0–49.7)
MCH: 29.6 pg (ref 25.1–34.0)
MCHC: 33 g/dL (ref 31.5–36.0)
MCV: 89.9 fL (ref 79.5–101.0)
MONO#: 0.5 10*3/uL (ref 0.1–0.9)
MONO%: 6.6 % (ref 0.0–14.0)
NEUT#: 5.2 10*3/uL (ref 1.5–6.5)
NEUT%: 64.4 % (ref 38.4–76.8)
Platelets: 261 10*3/uL (ref 145–400)
RBC: 4.52 10*6/uL (ref 3.70–5.45)
RDW: 14.4 % (ref 11.2–14.5)
WBC: 8.1 10*3/uL (ref 3.9–10.3)

## 2016-04-03 ENCOUNTER — Telehealth (HOSPITAL_COMMUNITY): Payer: Self-pay | Admitting: *Deleted

## 2016-04-03 NOTE — Telephone Encounter (Signed)
Patient given detailed instructions per Myocardial Perfusion Study Information Sheet for the test on 04/05/16 at 0745. Patient notified to arrive 15 minutes early and that it is imperative to arrive on time for appointment to keep from having the test rescheduled.  If you need to cancel or reschedule your appointment, please call the office within 24 hours of your appointment. Failure to do so may result in a cancellation of your appointment, and a $50 no show fee. Patient verbalized understanding.Collier Monica, Ranae Palms

## 2016-04-05 ENCOUNTER — Ambulatory Visit (HOSPITAL_COMMUNITY): Payer: Medicare PPO | Attending: Cardiology

## 2016-04-05 DIAGNOSIS — R42 Dizziness and giddiness: Secondary | ICD-10-CM | POA: Diagnosis not present

## 2016-04-05 DIAGNOSIS — R9431 Abnormal electrocardiogram [ECG] [EKG]: Secondary | ICD-10-CM

## 2016-04-05 DIAGNOSIS — R0602 Shortness of breath: Secondary | ICD-10-CM | POA: Diagnosis not present

## 2016-04-05 DIAGNOSIS — I2510 Atherosclerotic heart disease of native coronary artery without angina pectoris: Secondary | ICD-10-CM

## 2016-04-05 DIAGNOSIS — R5383 Other fatigue: Secondary | ICD-10-CM | POA: Insufficient documentation

## 2016-04-05 LAB — MYOCARDIAL PERFUSION IMAGING
CHL CUP NUCLEAR SDS: 6
CHL CUP NUCLEAR SRS: 3
CHL CUP RESTING HR STRESS: 58 {beats}/min
CSEPPHR: 84 {beats}/min
LV dias vol: 72 mL (ref 46–106)
LVSYSVOL: 24 mL
RATE: 0.28
SSS: 7
TID: 0.95

## 2016-04-05 MED ORDER — TECHNETIUM TC 99M TETROFOSMIN IV KIT
10.6000 | PACK | Freq: Once | INTRAVENOUS | Status: AC | PRN
  Administered 2016-04-05: 10.6 via INTRAVENOUS
  Filled 2016-04-05: qty 11

## 2016-04-05 MED ORDER — REGADENOSON 0.4 MG/5ML IV SOLN
0.4000 mg | Freq: Once | INTRAVENOUS | Status: AC
  Administered 2016-04-05: 0.4 mg via INTRAVENOUS

## 2016-04-05 MED ORDER — TECHNETIUM TC 99M TETROFOSMIN IV KIT
32.7000 | PACK | Freq: Once | INTRAVENOUS | Status: AC | PRN
  Administered 2016-04-05: 32.7 via INTRAVENOUS
  Filled 2016-04-05: qty 33

## 2016-04-06 ENCOUNTER — Ambulatory Visit (HOSPITAL_BASED_OUTPATIENT_CLINIC_OR_DEPARTMENT_OTHER): Payer: Medicare PPO | Admitting: Oncology

## 2016-04-06 VITALS — BP 114/55 | HR 70 | Temp 98.1°F | Resp 18 | Ht 66.5 in | Wt 163.9 lb

## 2016-04-06 DIAGNOSIS — Z170 Estrogen receptor positive status [ER+]: Secondary | ICD-10-CM

## 2016-04-06 DIAGNOSIS — E041 Nontoxic single thyroid nodule: Secondary | ICD-10-CM | POA: Diagnosis not present

## 2016-04-06 DIAGNOSIS — M810 Age-related osteoporosis without current pathological fracture: Secondary | ICD-10-CM

## 2016-04-06 DIAGNOSIS — C50312 Malignant neoplasm of lower-inner quadrant of left female breast: Secondary | ICD-10-CM

## 2016-04-06 DIAGNOSIS — Z79811 Long term (current) use of aromatase inhibitors: Secondary | ICD-10-CM

## 2016-04-06 DIAGNOSIS — C50212 Malignant neoplasm of upper-inner quadrant of left female breast: Secondary | ICD-10-CM

## 2016-04-06 MED ORDER — ALENDRONATE SODIUM 35 MG PO TABS: 35.0000 mg | ORAL_TABLET | ORAL | 6 refills | Status: DC

## 2016-04-06 MED ORDER — ANASTROZOLE 1 MG PO TABS: 1.0000 mg | ORAL_TABLET | Freq: Every day | ORAL | 4 refills | Status: DC

## 2016-04-06 NOTE — Progress Notes (Signed)
[facility]  [phone] [phone]     ID: Terri Ponce OB: 14-Feb-1939  MR#: [id number]  RXV#:[id number]  PCP: Mathews Argyle, MD GYN:   SU: Rolm Bookbinder, Armandina Gemma OTHER MD: Thea Silversmith  CHIEF COMPLAINT: Locally advanced left breast cancer  CURRENT TREATMENT: Anastrozole, alendronate   BREAST CANCER HISTORY: From the original intake Notes 08/13/2013:   [name] had screening mammography December of 2012 showing a low density oval nodule at the 9:00 position which was not palpable and which by ultrasound appeared to be a cyst. 6 month reevaluation along the 11/29/2011 showed a small nodule to have entirely resolved. Return to screening mammography was recommended.  However in January of 2015 the patient noted a change in her left breast which he initially felt was due to refer her bra rubbing on her skin. She was set up for bilateral diagnostic mammography and left breast ultrasonography at the breast center 06/20/2013. There was an asymmetry in the left breast area of palpable concern in exam showed a firm raised red area of spanning 3 cm along the far medial left breast inframammary fold. Ultrasound confirmed an area of skin thickening in ill-defined change measuring 1.7 cm. This extended to the skin. It was felt to be possibly due to an infected sebaceous cyst, and the patient was given antibiotics for a week  On 07/04/2013 repeat ultrasound of the left breast showed an irregular shadowing mass at the 8:00 position measuring 1.6 cm. The left axilla showed no lymphadenopathy. Physical exam showed a firm mass in this area. Biopsy was recommended and the patient's Rivaroxaban was temporarily stopped. On 07/16/2013, biopsy of the left breast 8:00 area in question showed ([id number]) and invasive lobular carcinoma (E-cadherin negative), grade 1, estrogen receptor 100% positive, progesterone receptor 74% positive, with an MIB-1 of 16%  and no HER-2 amplification, the signals ratio being 1.29 and the number per cell 2.25.  On 07/25/2013 the patient underwent bilateral breast MRI. This showed in the lower inner quadrant of the left breast in the inframammary fold a 3.2 cm irregular enhancing mass containing a biopsy clip. Some spicules of enhancement extended posteriorly to the liver of the pectoralis. The mass abuts and appears to involve overlying skin. In addition, a 9 mm separate area of nodular enhancement was noted anterior and medial to the biopsied area. There were 2 left internal mammary lymph nodes measuring 4 mm and 2 mm. There was also a morphologically abnormal 1 cm lymph node in the left axilla. Incidental finding in the liver noted multiple masses favored to represent cysts.  On 08/04/2013 the patient underwent biopsy of the suspicious left axillary lymph node and this showed ([id number]) no evidence of malignancy. This was felt to be "possibly not concordant". Repeat MRI in 6 months (assuming neoadjuvant treatment) was recommended.  The patient has met with Dr. Donne Hazel and Dr. Pablo Ledger. Dr. Donne Hazel have set the patient up for biopsy of the satellite lesion in the left breast. She feels the patient would benefit from neoadjuvant endocrine therapy. Dr. Pablo Ledger feels the patient will need radiation, even if she eventually ends up having a mastectomy.  The patient's subsequent history is as detailed below.  INTERVAL HISTORY: "Nasirah Ponce" returns today for followup of her left-sided positive breast cancer accompanied by her daughter Terri Ponce. Anayely Constantine continues on anastrozole with good tolerance. Hot flashes and vaginal dryness are not a major issue. She never developed the arthralgias or myalgias that many patients can  experience on this medication. She obtains it at a good price.  She is having more problems with the ibandronate. It caused her to feel like she had the flu for about a week." It does not meet down". She  wonders if she should be on a different drug or lower dose.  She had a stress test yesterday which was very favorable  REVIEW OF SYSTEMS: [name] is doing "good" overall. She lives independently, drives during the day, does all her shopping and cooking, and generally the only complaint she has her occasional night sweats. She needs a hearing aid. She has problems with psoriasis at times. A detailed review of systems today was otherwise stable  PAST MEDICAL HISTORY: Past Medical History:  Diagnosis Date  . A-fib ([place])   . Anemia   . Anxiety   . Arthritis    "stiffness and soreness in my joints; particularly knee joints and fingers" (04/08/2015)  . Cancer of left breast ([place]) dx'd 06/2013  . Depression    still grieving husband's death from 2014-02-23  . Hepatitis A   . History of blood transfusion 1956   "related to the SALPINGOOPHORECTOMY"  . Hypertension   . Multiple thyroid nodules    "very small; dx'd w/carotid doppler study"  . PONV (postoperative nausea and vomiting) 1956   none since  . Scintillating scotoma of right eye dx'd 02/2015  . Shortness of breath dyspnea    with exertion or excitement  . Walking pneumonia X 1   "self dx'd"   possible history of TIAs  PAST SURGICAL HISTORY: Past Surgical History:  Procedure Laterality Date  . APPENDECTOMY  1956  . BASAL CELL CARCINOMA EXCISION   09/2014   "forehead"  . BREAST BIOPSY Left 07/16/2013  . BREAST LUMPECTOMY Left 04/08/2015   WITH RADIOACTIVE SEED LOCALIZATION   . BREAST LUMPECTOMY WITH RADIOACTIVE SEED LOCALIZATION Left 04/08/2015   Procedure: BREAST LUMPECTOMY WITH RADIOACTIVE SEED LOCALIZATION;  Surgeon: Rolm Bookbinder, MD;  Location: [facility];  Service: General;  Laterality: Left;  . CATARACT EXTRACTION W/ INTRAOCULAR LENS IMPLANT Right   . FRACTURE SURGERY    . SALPINGOOPHORECTOMY Right 1956  . SQUAMOUS CELL CARCINOMA EXCISION   09/2014   "forehead"  . TONSILLECTOMY AND ADENOIDECTOMY    . WRIST  FRACTURE SURGERY Left July 2010   titanium    FAMILY HISTORY Family History  Problem Relation Age of Onset  . Arrhythmia Mother     A-Fib  . Hyperlipidemia Mother   . Hypertension Mother   . Cancer Mother     breast cancer  . Cancer Sister    the patient's father died at the age of 26, from complications of asbestosis. The patient's mother died at the age of 20. She had been diagnosed with breast cancer a few years before. The patient had no brothers, one sister. His sister died at age 87 from cancer which was metastatic but the patient does not know the primary  GYNECOLOGIC HISTORY:  Menarche age 68, first live birth age 72, the patient is [place] P2. She went through menopause at around age 70 or 42. She did not take hormone replacement. She took birth control pills for some years without any clotting complications  SOCIAL HISTORY:  Katora Fini has a Masters degree in public health, specifically nutrition. Her husband Merrilee Seashore was an Chief Financial Officer and he worked for [employer] division. Daughter Sullivan Blasing (goes by "Terri Ponce") is a Architect with the [employer].  Son lives  in [place] worry works as an Associate Professor. The patient has 2 grandchildren. She is a [employer]    ADVANCED DIRECTIVES:  in place; the patient's daughter is her healthcare power of attorney   HEALTH MAINTENANCE: Social History  Substance Use Topics  . Smoking status: Never Smoker  . Smokeless tobacco: Never Used  . Alcohol use No     Colonoscopy: Remote  PAP: remote  Bone density: 2014; "good"   Lipid panel:  Allergies  Allergen Reactions  . Sulfonamide Derivatives Other (See Comments)    Extreme anxiety  . Other Rash    Shingles vaccine  . Amoxicillin Other (See Comments)    abdominal pain    Current Outpatient Prescriptions  Medication Sig Dispense Refill  . acetaminophen (TYLENOL) 325 MG tablet Take 325 mg by mouth daily as needed (pain).    Marland Kitchen alendronate (FOSAMAX) 35 MG tablet Take  1 tablet (35 mg total) by mouth every 7 (seven) days. Take with a full glass of water on an empty stomach. 24 tablet 6  . anastrozole (ARIMIDEX) 1 MG tablet Take 1 tablet (1 mg total) by mouth daily. (Patient taking differently: Take 1 mg by mouth See admin instructions. Take 1 tablet (1 mg) by mouth at midnight or 1am) 90 tablet 0  . anastrozole (ARIMIDEX) 1 MG tablet Take 1 tablet (1 mg total) by mouth daily. 90 tablet 4  . apixaban (ELIQUIS) 5 MG TABS tablet Take 1 tablet (5 mg total) by mouth 2 (two) times daily. (Patient taking differently: Take 5 mg by mouth every evening. ) 180 tablet 3  . aspirin 81 MG tablet Take 81 mg by mouth daily. Reported on 09/07/2015    . Calcium-Magnesium 500-250 MG TABS Take 1 tablet by mouth daily.    . Calcium-Magnesium-Vitamin D 600-40-500 MG-MG-UNIT TB24 Take 1,200 mEq by mouth.    . Cholecalciferol (VITAMIN D3) 2000 UNITS TABS Take 2,000 Units by mouth daily.    Marland Kitchen CRANBERRY PO Take 1 tablet by mouth every other day.     . diltiazem (CARDIZEM) 30 MG tablet Take 1 tablet (30 mg total) by mouth daily. (Patient taking differently: Take 30 mg by mouth at bedtime. ) 90 tablet 3  . Multiple Vitamin (MULTIVITAMIN WITH MINERALS) TABS tablet Take 1 tablet by mouth daily.     No current facility-administered medications for this visit.     OBSERVATION: Elderly white woman In no acute distress Vitals:   04/06/16 1443  BP: (!) 114/55  Pulse: 70  Resp: 18  Temp: 98.1 F (36.7 C)     Body mass index is 26.06 kg/m.    ECOG FS:1 - Symptomatic but completely ambulatory  Sclerae unicteric, pupils round and equal Oropharynx clear and moist-- no thrush or other lesions No cervical or supraclavicular adenopathy Lungs no rales or rhonchi Heart regular rate and rhythm Abd soft, nontender, positive bowel sounds MSK no focal spinal tenderness, no upper extremity lymphedema Neuro: nonfocal, well oriented, appropriate affect Breasts: The right breast is benign. The left  breast is status post lumpectomy followed by radiation. There is no evidence of local recurrence. The left axilla is unremarkable  LAB RESULTS:  CMP     Component Value Date/Time   NA 140 03/30/2016 1500    I No results found for: SPEP  Lab Results  Component Value Date   WBC 8.1 03/30/2016      Chemistry      Component Value Date/Time   NA 140 03/30/2016 1500  Component Value Date/Time   CALCIUM 9.7 03/30/2016 1500       No results found for: LABCA2  No components found for: TMLYY503  No results for input(s): INR in the last 168 hours.  Urinalysis No results found for: COLORURINE  STUDIES: CLINICAL DATA:  82 year old female who underwent lumpectomy of the lower, inner left breast in November 2016 demonstrating multifocal invasive lobular carcinoma with focally positive medial margin. The patient received radiation.  EXAM: 2D DIGITAL DIAGNOSTIC BILATERAL MAMMOGRAM WITH CAD AND ADJUNCT TOMO  COMPARISON:  Previous exam(s).  ACR Breast Density Category b: There are scattered areas of fibroglandular density.  FINDINGS: New postlumpectomy changes are seen within the lower, inner left breast. No suspicious mass, calcifications, or other abnormality is identified within either breast.  Mammographic images were processed with CAD.  IMPRESSION: No mammographic evidence of residual or new malignancy.  RECOMMENDATION: 1. Bilateral diagnostic mammogram in 1 year. 2. Given the far posterior location of the left breast malignancy, breast MRI may be considered for future monitoring.  I have discussed the findings and recommendations with the patient. Results were also provided in writing at the conclusion of the visit. If applicable, a reminder letter will be sent to the patient regarding the next appointment.  BI-RADS CATEGORY  2: Benign.   Electronically Signed   By: Pamelia Hoit M.D.   On: 03/02/2016 16:37  ASSESSMENT: 82 y.o.  [place] woman status post left breast biopsy 07/16/2013 for a clinically multifocal T2 N0-3 (stage IIA-IIIC) invasive lobular breast cancer, estrogen receptor 100% positive, progesterone receptor 74% positive, with an MIB-1 of 16% and no HER-2 amplification  (1) biopsy of a suspicious left axillary lymph node 08/04/2013 was negative, but possibly discordant  (2) the patient has 2 suspicious left internal mammary lymph nodes as well as some indeterminate liver lesions which were negative on PET scan 08/26/2013 and by repeat breast MRI are c/w cysts  (3) MRI biopsy of a satellite lesion in the left breast 08/15/2013 showed atypical lobular hyperplasia.  (4) anastrozole started 08/13/2013  (a) breast MRI 01/20/2014 shows near complete resolution of the measurable mass  (b) breast MRI 01/27/2015 shows evidence of progression  (5) DEXA scan 03/05/2013 at [facility] showed osteoporosis with the lowest T score at -2.7  (6) left lumpectomy 04/08/2015 showed a pT1c pN0, stage IA invasive ductal carcinoma, grade 1, repeat HER-2 again negative.  (7) adjuvant radiation 05/27/2015-06/24/2015:  Left breast/ 42.72 Gy at 2.67 Gy per fraction x 16 fractions.  Left breast boost/ 10 Gy at 2 Gy per fraction x 5 fractions  (8) thyroid nodule s/p biopsy 06/01/2015 showing mild atypia ([id number])  (9 osteoporosis, with a T score of -2.7 on bone density scan 03/05/2013  (a) ibandronate started 07/21/2015; discontinued November 2017  (b) alendronate 35 mg twice a month started 04/05/2016   PLAN: Leianna Barga is now a little over 2 years out from her original surgery with no evidence of recurrence. This is favorable. She is one year out from her more recent surgery  She is tolerating the anastrozole well. The plan will be to continue that for a total of 5 years.  She is having more trouble with the ibandronate. I think she might tolerate a lower dose of a bisphosphonate, so we are going with 35 mg of  alendronate twice a month. She will dated the first and 15th of every month. If she still has symptoms with that she will let me know.  We discussed whether she  needed to proceed to a thyroid biopsy. She continues to be reluctant. Given how slow-growing thyroid cancers are and given her age and overall situation I am comfortable postponing that indefinitely.  She is going to try to learn to use a "smart found" and show me some pictures that she took the next time she sees me which will be one year from now.        Chauncey Cruel, MD   04/06/2016 3:10 PM

## 2016-04-18 ENCOUNTER — Telehealth: Payer: Self-pay | Admitting: *Deleted

## 2016-04-18 NOTE — Telephone Encounter (Signed)
"  I need to talk with the nurse about questions I have about the Alendronate.  The directions on what I purchased read take one pill every week but I was told take one on the 1st and 15 th day og the month.  Is the 35 mg enough and which directions am I to use."

## 2016-04-18 NOTE — Telephone Encounter (Signed)
This RN returned call to pt and obtained identified VM.  Message left stating due to discussed side effects per last visit recommendation is for pt to decrease dose of fosamax to twice a month.  Refill was done electronically and does not reflect dose change.  This RN requested a return call to verify pt's understanding of above instructions.

## 2016-04-19 MED ORDER — ALENDRONATE SODIUM 35 MG PO TABS: 35.0000 mg | ORAL_TABLET | ORAL | 6 refills | Status: DC

## 2016-05-04 DIAGNOSIS — Z23 Encounter for immunization: Secondary | ICD-10-CM | POA: Diagnosis not present

## 2016-05-05 NOTE — Telephone Encounter (Signed)
NO ENTRY 

## 2016-06-01 ENCOUNTER — Telehealth: Payer: Self-pay | Admitting: *Deleted

## 2016-06-01 DIAGNOSIS — Z0000 Encounter for general adult medical examination without abnormal findings: Secondary | ICD-10-CM | POA: Diagnosis not present

## 2016-06-01 DIAGNOSIS — M810 Age-related osteoporosis without current pathological fracture: Secondary | ICD-10-CM | POA: Diagnosis not present

## 2016-06-01 DIAGNOSIS — Z79899 Other long term (current) drug therapy: Secondary | ICD-10-CM | POA: Diagnosis not present

## 2016-06-01 DIAGNOSIS — E7800 Pure hypercholesterolemia, unspecified: Secondary | ICD-10-CM | POA: Diagnosis not present

## 2016-06-01 DIAGNOSIS — Z1389 Encounter for screening for other disorder: Secondary | ICD-10-CM | POA: Diagnosis not present

## 2016-06-01 DIAGNOSIS — C50919 Malignant neoplasm of unspecified site of unspecified female breast: Secondary | ICD-10-CM | POA: Diagnosis not present

## 2016-06-01 DIAGNOSIS — Z23 Encounter for immunization: Secondary | ICD-10-CM | POA: Diagnosis not present

## 2016-06-01 DIAGNOSIS — I482 Chronic atrial fibrillation: Secondary | ICD-10-CM | POA: Diagnosis not present

## 2016-06-01 DIAGNOSIS — E041 Nontoxic single thyroid nodule: Secondary | ICD-10-CM | POA: Diagnosis not present

## 2016-06-01 NOTE — Telephone Encounter (Signed)
Call from pt's daughter requesting clarification of Fosamax instructions. Instructions reviewed per notes, one tablet Q 2weeks. Decreased due to side effects. She voiced understanding.

## 2016-06-23 ENCOUNTER — Ambulatory Visit (INDEPENDENT_AMBULATORY_CARE_PROVIDER_SITE_OTHER): Payer: Medicare PPO | Admitting: Cardiovascular Disease

## 2016-06-23 ENCOUNTER — Encounter: Payer: Self-pay | Admitting: Cardiovascular Disease

## 2016-06-23 VITALS — BP 98/70 | HR 71 | Ht 66.0 in | Wt 166.0 lb

## 2016-06-23 DIAGNOSIS — I480 Paroxysmal atrial fibrillation: Secondary | ICD-10-CM

## 2016-06-23 DIAGNOSIS — I10 Essential (primary) hypertension: Secondary | ICD-10-CM

## 2016-06-23 MED ORDER — APIXABAN 5 MG PO TABS: 5.0000 mg | ORAL_TABLET | Freq: Two times a day (BID) | ORAL | 11 refills | Status: DC

## 2016-06-23 NOTE — Progress Notes (Signed)
Cardiology Office Note   Date:  06/23/2016   ID:  Terri Ponce, DOB [birth year], MRN [id number]  PCP:  Mathews Argyle, MD  Cardiologist:  Mertie Moores, MD   Chief Complaint  Patient presents with  . Follow-up    atrial fib   1. Paroxysmal atrial fibrillation 2. Thyroid nodules 3. Hx of Hypertension   Previous notes:   Terri Ponce is an 83 year old female ([name]) who is seen today for further evaluation of paroxysmal atrial fibrillation. She has had smptoms for years - palps off and on for years. The palpitations are sporatic. She thinks they may be related to stress. She has a sensation in her left ribs. She is able to push through the palpitations and denies any CP or dyspnea associated with the arrhythmia. These palpitatioins   Recently she had some dyspnea at the beach and last week when she went to see Dr. Felipa Eth. She seems to have some slight dizziness and unsteadiness recently.   She has had HTN in the past - resolved with a better diet and exercise and weight loss program. BP now is ok.  She goes to the silver sneakers program And measures her BP several times a week. She has been having night sweats for the past several years.   Dec. 3, 2014:  Terri Ponce is seen today with her daughter Terri Ponce). She has tapered her Diltiazem 30 mg a day because of dizziness and nausea. We also tried slow release Cardizem but she had dizziness and nausea with that as well.   October 31, 2013:  Terri Ponce is doing well. Has a few palpitations - no prolonged episodes of Afib  04/01/2014:  Terri Ponce is seen today for a followup of her paroxysmal atrial fibrillation. She also has a history of hypertension. She has been depressed since 03-20-23 after her husband died. She has had lots of burning and tingling sensation in her legs - especially at night.  She was seen by Christell Faith in the [place] office. Arterial ( ABI ) was normal. Labs were  unremarkable.   She finds that her peripheral her neuropathy symptoms would typically resolve if she moves her legs around in a bicycle type pattern.   Sep 21, 2014:  : Terri Ponce is a 83 y.o. female who presents for her hypertension and atrial fibrillation.  She is doing well. She has had some palpitations at night.  Lasted only a few minutes.  Took several deep breaths and felt better.  Turned over and felt better. Has had some nose bleeds in January.  No further episodes since .   Oct 05, 2015:  Doing well.  HR and BP are well controlled. Occasionally has palpitations .  Only transient ,  Not associated with any worsening of the dyspnea Palpitations are very brief.   Nov. 2, 2017:  Doing well.  Rare occasions she has an unusual sensation in her chest. She does not describe his chest pain but says that that there is deftly something in her chest that feels somewhat uncomfortable. She has at least mild dementia and it's difficult for her to tell a lot of her symptoms.  Gets leg cramps if she forgets her meds  Has been going to [facility]  on occasion, 1-2 times a week   Feb. 2, 2018:  No complaints.  She had some ECG changes seen at her last visit.  myoview was low risk  She was taking the Eliquis only once a  day .  She was bruising and had a nose bleed ( this was 2 years ago)   Past Medical History:  Diagnosis Date  . A-fib ([place])   . Anemia   . Anxiety   . Arthritis    "stiffness and soreness in my joints; particularly knee joints and fingers" (04/08/2015)  . Cancer of left breast ([place]) dx'd 06/2013  . Depression    still grieving husband's death from 2014-03-07  . Hepatitis A   . History of blood transfusion 1956   "related to the SALPINGOOPHORECTOMY"  . Hypertension   . Multiple thyroid nodules    "very small; dx'd w/carotid doppler study"  . PONV (postoperative nausea and vomiting) 1956   none since  . Scintillating scotoma of right eye dx'd 02/2015    . Shortness of breath dyspnea    with exertion or excitement  . Walking pneumonia X 1   "self dx'd"    Past Surgical History:  Procedure Laterality Date  . APPENDECTOMY  1956  . BASAL CELL CARCINOMA EXCISION   09/2014   "forehead"  . BREAST BIOPSY Left 07/16/2013  . BREAST LUMPECTOMY Left 04/08/2015   WITH RADIOACTIVE SEED LOCALIZATION   . BREAST LUMPECTOMY WITH RADIOACTIVE SEED LOCALIZATION Left 04/08/2015   Procedure: BREAST LUMPECTOMY WITH RADIOACTIVE SEED LOCALIZATION;  Surgeon: Rolm Bookbinder, MD;  Location: [facility];  Service: General;  Laterality: Left;  . CATARACT EXTRACTION W/ INTRAOCULAR LENS IMPLANT Right   . FRACTURE SURGERY    . SALPINGOOPHORECTOMY Right 1956  . SQUAMOUS CELL CARCINOMA EXCISION   09/2014   "forehead"  . TONSILLECTOMY AND ADENOIDECTOMY    . WRIST FRACTURE SURGERY Left July 2010   titanium     Current Outpatient Prescriptions  Medication Sig Dispense Refill  . acetaminophen (TYLENOL) 325 MG tablet Take 325 mg by mouth daily as needed (pain).    Marland Kitchen alendronate (FOSAMAX) 35 MG tablet Take 1 tablet (35 mg total) by mouth every 14 (fourteen) days. Take with a full glass of water on an empty stomach. 24 tablet 6  . anastrozole (ARIMIDEX) 1 MG tablet Take 1 tablet (1 mg total) by mouth daily. 90 tablet 0  . apixaban (ELIQUIS) 5 MG TABS tablet Take 5 mg by mouth daily.    . Calcium-Magnesium 500-250 MG TABS Take 1 tablet by mouth daily.    . Calcium-Magnesium-Vitamin D 600-40-500 MG-MG-UNIT TB24 Take 1,200 mEq by mouth.    . Cholecalciferol (VITAMIN D3) 2000 UNITS TABS Take 2,000 Units by mouth daily.    . Coenzyme Q10 (CO Q 10) 100 MG CAPS Take 200 mg by mouth daily.    Marland Kitchen CRANBERRY PO Take 1 tablet by mouth every other day.     . diltiazem (CARDIZEM) 30 MG tablet Take 1 tablet (30 mg total) by mouth daily. 90 tablet 3  . Multiple Vitamin (MULTIVITAMIN WITH MINERALS) TABS tablet Take 1 tablet by mouth daily.     No current facility-administered medications  for this visit.     Allergies:   Sulfonamide derivatives; Other; and Amoxicillin    Social History:  The patient  reports that she has never smoked. She has never used smokeless tobacco. She reports that she does not drink alcohol or use drugs.   Family History:  The patient's family history includes Arrhythmia in her mother; Cancer in her mother and sister; Hyperlipidemia in her mother; Hypertension in her mother.    ROS:  Please see the history of present illness.    Review  of Systems: Constitutional:  denies fever, chills, diaphoresis, appetite change and fatigue.  HEENT: denies photophobia, eye pain, redness, hearing loss, ear pain, congestion, sore throat, rhinorrhea, sneezing, neck pain, neck stiffness and tinnitus.  Respiratory: denies SOB, DOE, cough, chest tightness, and wheezing.  Cardiovascular: denies chest pain, palpitations and leg swelling.  Gastrointestinal: denies nausea, vomiting, abdominal pain, diarrhea, constipation, blood in stool.  Genitourinary: denies dysuria, urgency, frequency, hematuria, flank pain and difficulty urinating.  Musculoskeletal: denies  myalgias, back pain, joint swelling, arthralgias and gait problem.   Skin: denies pallor, rash and wound.  Neurological: denies dizziness, seizures, syncope, weakness, light-headedness, numbness and headaches.   Hematological: denies adenopathy, easy bruising, personal or family bleeding history.  Psychiatric/ Behavioral: denies suicidal ideation, mood changes, confusion, nervousness, sleep disturbance and agitation.       All other systems are reviewed and negative.    PHYSICAL EXAM: VS:  BP 98/70   Pulse 71   Ht 5\' 6"  (1.676 m)   Wt 166 lb (75.3 kg)   SpO2 94%   BMI 26.79 kg/m  , BMI Body mass index is 26.79 kg/m. GEN: Well nourished, well developed, in no acute distress  HEENT: normal  Neck: no JVD, carotid bruits, or masses Cardiac: RRR; no murmurs, rubs, or gallops,no edema  Respiratory:   clear to auscultation bilaterally, normal work of breathing GI: soft, nontender, nondistended, + BS MS: no deformity or atrophy  Skin: warm and dry, no rash Neuro:  Strength and sensation are intact Psych: normal   EKG:  EKG is ordered today. The ekg ordered today demonstrates   Sinus brady at 59.   Sinus arrhythmia.  TWI in leads V3-V6. Marland Kitchen  The TWI is new since her last ECG      Recent Labs: 03/30/2016: ALT 17; BUN 19.6; Creatinine 0.8; HGB 13.4; Platelets 261; Potassium 3.9; Sodium 140    Lipid Panel    Component Value Date/Time   CHOL 150 03/23/2016 1053   TRIG 229 (H) 03/23/2016 1053   HDL 30 (L) 03/23/2016 1053   CHOLHDL 5.0 03/23/2016 1053   VLDL 46 (H) 03/23/2016 1053   LDLCALC 74 03/23/2016 1053      Wt Readings from Last 3 Encounters:  06/23/16 166 lb (75.3 kg)  04/06/16 163 lb 14.4 oz (74.3 kg)  04/05/16 160 lb (72.6 kg)      Other studies Reviewed: Additional studies/ records that were reviewed today include: . Review of the above records demonstrates:    ASSESSMENT AND PLAN:  1. ECG abn:    myoview was low risk.  No episodes of CP    2. Paroxysmal atrial fibrillation- is maintaining NSR , doing well.   Will see her in 1 year. She Decreased the dose of her Eliquis 5 mg once a day. She was afraid that the 2 pills were  too much. We had a long discussion about the risk of stroke in the setting of atrial fibrillation. The proper dose for her is Eliquis 5 mg twice a day.  3. Hx of Hypertension -  BP is well controlloled.   Current medicines are reviewed at length with the patient today.  The patient has concerns regarding medicines.  The following changes have been made:  no change  Labs/ tests ordered today include:  No orders of the defined types were placed in this encounter.  Disposition:   FU with me in 6 months     Mertie Moores, MD  06/23/2016 2:20 PM  [facility] [place], [place], [place]  [address] Phone:  [phone]; Fax: [phone]

## 2016-06-23 NOTE — Patient Instructions (Signed)
Medication Instructions:  RESUME Eliquis 5 mg twice daily   Labwork: None Ordered   Testing/Procedures: None Ordered   Follow-Up: Your physician wants you to follow-up in: 6 months with Dr. Acie Fredrickson.  You will receive a reminder letter in the mail two months in advance. If you don't receive a letter, please call our office to schedule the follow-up appointment.   If you need a refill on your cardiac medications before your next appointment, please call your pharmacy.   Thank you for choosing [facility]! Christen Bame, RN [phone]

## 2016-07-10 ENCOUNTER — Other Ambulatory Visit: Payer: Self-pay | Admitting: Surgery

## 2016-07-10 DIAGNOSIS — D440 Neoplasm of uncertain behavior of thyroid gland: Secondary | ICD-10-CM

## 2016-07-11 ENCOUNTER — Other Ambulatory Visit: Payer: Self-pay | Admitting: Surgery

## 2016-07-11 DIAGNOSIS — D440 Neoplasm of uncertain behavior of thyroid gland: Secondary | ICD-10-CM

## 2016-07-25 ENCOUNTER — Other Ambulatory Visit: Payer: Medicare PPO

## 2016-09-01 ENCOUNTER — Telehealth: Payer: Self-pay | Admitting: Cardiovascular Disease

## 2016-09-01 NOTE — Telephone Encounter (Signed)
New Message:   Pt needs to know what antibiotic can she use. She is going to have some dental work and her dentist wants to know what antibiotic she can have? Pt says she is allergic to Amoxicillin.

## 2016-09-01 NOTE — Telephone Encounter (Signed)
Pt advised she has no AHA recommended indication for endocarditis prophylaxis prior to dental appointment/ treatment.

## 2016-10-05 DIAGNOSIS — F4321 Adjustment disorder with depressed mood: Secondary | ICD-10-CM | POA: Diagnosis not present

## 2016-10-12 ENCOUNTER — Telehealth: Payer: Self-pay | Admitting: *Deleted

## 2016-10-12 NOTE — Telephone Encounter (Signed)
-----   Message from Thayer Headings, MD sent at 10/12/2016  3:55 PM EDT ----- Hi, Would it be possible to get Terri Ponce worked into a Music therapist.   Is there an APP slot available.   It looks like my schedule is full.  Call her daughter [name] -  to arrange [phone]) .   Possible 7:40 appt with me if needed.  Please let me know if she is coming at 7:40  With ECG   Thanks  Phil

## 2016-10-12 NOTE — Telephone Encounter (Signed)
Pt scheduled w/ Nahser tomorrow morning at 7:40 (no APP openings available). dtr is agreeable to plan

## 2016-10-13 ENCOUNTER — Encounter (INDEPENDENT_AMBULATORY_CARE_PROVIDER_SITE_OTHER): Payer: Self-pay

## 2016-10-13 ENCOUNTER — Encounter: Payer: Self-pay | Admitting: Cardiovascular Disease

## 2016-10-13 ENCOUNTER — Ambulatory Visit (INDEPENDENT_AMBULATORY_CARE_PROVIDER_SITE_OTHER): Payer: Medicare PPO | Admitting: Cardiovascular Disease

## 2016-10-13 VITALS — BP 140/60 | HR 63 | Ht 67.0 in | Wt 164.8 lb

## 2016-10-13 DIAGNOSIS — M542 Cervicalgia: Secondary | ICD-10-CM

## 2016-10-13 DIAGNOSIS — I10 Essential (primary) hypertension: Secondary | ICD-10-CM | POA: Diagnosis not present

## 2016-10-13 DIAGNOSIS — R0989 Other specified symptoms and signs involving the circulatory and respiratory systems: Secondary | ICD-10-CM | POA: Insufficient documentation

## 2016-10-13 DIAGNOSIS — I480 Paroxysmal atrial fibrillation: Secondary | ICD-10-CM

## 2016-10-13 NOTE — Progress Notes (Signed)
Cardiology Office Note   Date:  10/13/2016   ID:  Terri Ponce, DOB 02/28/1929, MRN [id number]  PCP:  Lajean Manes, MD  Cardiologist:  Mertie Moores, MD   Chief Complaint  Patient presents with  . Shoulder Pain   1. Paroxysmal atrial fibrillation 2. Thyroid nodules 3. Hx of Hypertension   Previous notes:   Terri Ponce is an 83 year old female ([name]) who is seen today for further evaluation of paroxysmal atrial fibrillation. She has had smptoms for years - palps off and on for years. The palpitations are sporatic. She thinks they may be related to stress. She has a sensation in her left ribs. She is able to push through the palpitations and denies any CP or dyspnea associated with the arrhythmia. These palpitatioins   Recently she had some dyspnea at the beach and last week when she went to see Dr. Felipa Eth. She seems to have some slight dizziness and unsteadiness recently.   She has had HTN in the past - resolved with a better diet and exercise and weight loss program. BP now is ok.  She goes to the silver sneakers program And measures her BP several times a week. She has been having night sweats for the past several years.   Dec. 3, 2014:  Terri Ponce is seen today with her daughter Terri Ponce). She has tapered her Diltiazem 30 mg a day because of dizziness and nausea. We also tried slow release Cardizem but she had dizziness and nausea with that as well.   October 31, 2013:  Terri Ponce is doing well. Has a few palpitations - no prolonged episodes of Afib  04/01/2014:  Terri Ponce is seen today for a followup of her paroxysmal atrial fibrillation. She also has a history of hypertension. She has been depressed since 02-23-23 after her husband died. She has had lots of burning and tingling sensation in her legs - especially at night.  She was seen by Christell Faith in the [place] office. Arterial ( ABI ) was normal. Labs were unremarkable.    She finds that her peripheral her neuropathy symptoms would typically resolve if she moves her legs around in a bicycle type pattern.   Sep 21, 2014:  : Terri Ponce is a 83 y.o. female who presents for her hypertension and atrial fibrillation.  She is doing well. She has had some palpitations at night.  Lasted only a few minutes.  Took several deep breaths and felt better.  Turned over and felt better. Has had some nose bleeds in January.  No further episodes since .   Oct 05, 2015:  Doing well.  HR and BP are well controlled. Occasionally has palpitations .  Only transient ,  Not associated with any worsening of the dyspnea Palpitations are very brief.   Nov. 2, 2017:  Doing well.  Rare occasions she has an unusual sensation in her chest. She does not describe his chest pain but says that that there is deftly something in her chest that feels somewhat uncomfortable. She has at least mild dementia and it's difficult for her to tell a lot of her symptoms.  Gets leg cramps if she forgets her meds  Has been going to [facility]  on occasion, 1-2 times a week   Feb. 2, 2018:  No complaints.  She had some ECG changes seen at her last visit.  myoview was low risk  She was taking the Eliquis only once a day .  She  was bruising and had a nose bleed ( this was 2 years ago)   10/13/2016:  Terri Ponce is seen today as a work in visit for shoulder pain and neck pain.  Having primarily right neck pain .  Not worsened with moving her right arm.   Not worsened with walking or climbing stairs . No associated shortness of breath, no diaphoresis.  Lasts for a few seconds.  Off and on through the day  Has been there for the past day or so  Has had some right jaw pain with a tooth ache today   Is only taking the Eliquis once a day   Past Medical History:  Diagnosis Date  . A-fib ([place])   . Anemia   . Anxiety   . Arthritis    "stiffness and soreness in my joints; particularly knee joints  and fingers" (04/08/2015)  . Cancer of left breast ([place]) dx'd 06/2013  . Depression    still grieving husband's death from 2014/02/07  . Hepatitis A   . History of blood transfusion 1956   "related to the SALPINGOOPHORECTOMY"  . Hypertension   . Multiple thyroid nodules    "very small; dx'd w/carotid doppler study"  . PONV (postoperative nausea and vomiting) 1956   none since  . Scintillating scotoma of right eye dx'd 02/2015  . Shortness of breath dyspnea    with exertion or excitement  . Walking pneumonia X 1   "self dx'd"    Past Surgical History:  Procedure Laterality Date  . APPENDECTOMY  1956  . BASAL CELL CARCINOMA EXCISION   09/2014   "forehead"  . BREAST BIOPSY Left 07/16/2013  . BREAST LUMPECTOMY Left 04/08/2015   WITH RADIOACTIVE SEED LOCALIZATION   . BREAST LUMPECTOMY WITH RADIOACTIVE SEED LOCALIZATION Left 04/08/2015   Procedure: BREAST LUMPECTOMY WITH RADIOACTIVE SEED LOCALIZATION;  Surgeon: Rolm Bookbinder, MD;  Location: [facility];  Service: General;  Laterality: Left;  . CATARACT EXTRACTION W/ INTRAOCULAR LENS IMPLANT Right   . FRACTURE SURGERY    . SALPINGOOPHORECTOMY Right 1956  . SQUAMOUS CELL CARCINOMA EXCISION   09/2014   "forehead"  . TONSILLECTOMY AND ADENOIDECTOMY    . WRIST FRACTURE SURGERY Left July 2010   titanium     Current Outpatient Prescriptions  Medication Sig Dispense Refill  . acetaminophen (TYLENOL) 325 MG tablet Take 325 mg by mouth daily as needed (pain).    Marland Kitchen alendronate (FOSAMAX) 35 MG tablet Take 1 tablet (35 mg total) by mouth every 14 (fourteen) days. Take with a full glass of water on an empty stomach. 24 tablet 6  . anastrozole (ARIMIDEX) 1 MG tablet Take 1 tablet (1 mg total) by mouth daily. 90 tablet 0  . apixaban (ELIQUIS) 5 MG TABS tablet Take 1 tablet (5 mg total) by mouth 2 (two) times daily. 60 tablet 11  . Calcium-Magnesium 500-250 MG TABS Take 1 tablet by mouth daily.    . Calcium-Magnesium-Vitamin D 600-40-500  MG-MG-UNIT TB24 Take 1,200 mEq by mouth.    . Cholecalciferol (VITAMIN D3) 2000 UNITS TABS Take 2,000 Units by mouth daily.    . Coenzyme Q10 (CO Q 10) 100 MG CAPS Take 200 mg by mouth daily.    Marland Kitchen CRANBERRY PO Take 1 tablet by mouth every other day.     . diltiazem (CARDIZEM) 30 MG tablet Take 1 tablet (30 mg total) by mouth daily. 90 tablet 3  . Multiple Vitamin (MULTIVITAMIN WITH MINERALS) TABS tablet Take 1 tablet by mouth daily.  No current facility-administered medications for this visit.     Allergies:   Sulfonamide derivatives; Other; and Amoxicillin    Social History:  The patient  reports that she has never smoked. She has never used smokeless tobacco. She reports that she does not drink alcohol or use drugs.   Family History:  The patient's family history includes Arrhythmia in her mother; Cancer in her mother and sister; Hyperlipidemia in her mother; Hypertension in her mother.    ROS:  Please see the history of present illness.    Review of Systems: Constitutional:  denies fever, chills, diaphoresis, appetite change and fatigue.  HEENT: denies photophobia, eye pain, redness, hearing loss, ear pain, congestion, sore throat, rhinorrhea, sneezing, neck pain, neck stiffness and tinnitus.  Respiratory: denies SOB, DOE, cough, chest tightness, and wheezing.  Cardiovascular: denies chest pain, palpitations and leg swelling.  Gastrointestinal: denies nausea, vomiting, abdominal pain, diarrhea, constipation, blood in stool.  Genitourinary: denies dysuria, urgency, frequency, hematuria, flank pain and difficulty urinating.  Musculoskeletal: denies  myalgias, back pain, joint swelling, arthralgias and gait problem.   Skin: denies pallor, rash and wound.  Neurological: denies dizziness, seizures, syncope, weakness, light-headedness, numbness and headaches.   Hematological: denies adenopathy, easy bruising, personal or family bleeding history.  Psychiatric/ Behavioral: denies  suicidal ideation, mood changes, confusion, nervousness, sleep disturbance and agitation.       All other systems are reviewed and negative.    PHYSICAL EXAM: VS:  BP 140/60 (BP Location: Left Arm, Patient Position: Sitting, Cuff Size: Normal)   Pulse 63   Ht 5\' 7"  (1.702 m)   Wt 164 lb 12.8 oz (74.8 kg)   BMI 25.81 kg/m  , BMI Body mass index is 25.81 kg/m. GEN: Well nourished, well developed, in no acute distress  HEENT: normal  Neck: no JVD, carotid bruits, or masses Cardiac: RRR; no murmurs, rubs, or gallops,no edema  Respiratory:  clear to auscultation bilaterally, normal work of breathing GI: soft, nontender, nondistended, + BS MS: no deformity or atrophy  Skin: warm and dry, no rash Neuro:  Strength and sensation are intact Psych: normal   EKG:  EKG is ordered today. Oct 13, 2016:   NSR with  sinus arrhythmia,  LAD      Recent Labs: 03/30/2016: ALT 17; BUN 19.6; Creatinine 0.8; HGB 13.4; Platelets 261; Potassium 3.9; Sodium 140    Lipid Panel    Component Value Date/Time   CHOL 150 03/23/2016 1053   TRIG 229 (H) 03/23/2016 1053   HDL 30 (L) 03/23/2016 1053   CHOLHDL 5.0 03/23/2016 1053   VLDL 46 (H) 03/23/2016 1053   LDLCALC 74 03/23/2016 1053      Wt Readings from Last 3 Encounters:  10/13/16 164 lb 12.8 oz (74.8 kg)  06/23/16 166 lb (75.3 kg)  04/06/16 163 lb 14.4 oz (74.3 kg)      Other studies Reviewed: Additional studies/ records that were reviewed today include: . Review of the above records demonstrates:    ASSESSMENT AND PLAN:  1. Shoulder pain :   Terri Ponce is seen today as a work in visit for chest pain/shoulder pain with radiation to the neck. She presents with some vague episodes of chest and neck pain. There is no associated blurry vision or visual changes. The history is somewhat difficult because of her mild to moderate dementia. Her symptoms do not sound cardiac in origin. We have done a stress test in November, 2017 which was  negative.  She does have a  history of moderate carotid artery disease. We'll repeat carotid duplex scan. We'll also check a sedimentation rate to rule out temporal arteritis. We'll check basic labs including a basic metabolic profile, CBC, TSH,. We'll also check a troponin level to rule out any acute coronary syndrome. I will see her in 3 months. Her daughter will call if she has any further problems.   2. Paroxysmal atrial fibrillation- is maintaining NSR , doing well.   Will see her in 1 year. She Decreased the dose of her Eliquis 5 mg once a day. She was afraid that the 2 pills were  too much. We had a long discussion about the risk of stroke in the setting of atrial fibrillation. The proper dose for her is Eliquis 5 mg twice a day.  3. Hx of Hypertension -  BP is well controlloled.   Current medicines are reviewed at length with the patient today.  The patient has concerns regarding medicines.  The following changes have been made:  no change  Labs/ tests ordered today include:   Orders Placed This Encounter  Procedures  . Basic Metabolic Panel (BMET)  . CBC w/Diff  . TSH  . Sed Rate (ESR)  . Troponin I  . EKG 12-Lead   Disposition:   FU with me in 6 months     Mertie Moores, MD  10/13/2016 8:40 AM    [facility] [place], [place], [place]  [address] Phone: [phone]; Fax: [phone]

## 2016-10-13 NOTE — Patient Instructions (Addendum)
Medication Instructions:    Your physician recommends that you continue on your current medications as directed. Please refer to the Current Medication list given to you today.  - If you need a refill on your cardiac medications before your next appointment, please call your pharmacy.   Labwork:  Today: BMET, CBC w/ diff, TSH, ESR & Troponin  Testing/Procedures: Your physician has requested that you have a carotid duplex. This test is an ultrasound of the carotid arteries in your neck. It looks at blood flow through these arteries that supply the brain with blood. Allow one hour for this exam. There are no restrictions or special instructions.  Follow-Up:  Your physician recommends that you schedule a follow-up appointment in: 3 months with Dr. Acie Fredrickson.  Thank you for choosing [facility]!!    Any Other Special Instructions Will Be Listed Below (If Applicable).

## 2016-10-14 LAB — CBC WITH DIFFERENTIAL/PLATELET
BASOS ABS: 0 10*3/uL (ref 0.0–0.2)
Basos: 0 %
EOS (ABSOLUTE): 0.3 10*3/uL (ref 0.0–0.4)
Eos: 4 %
HEMATOCRIT: 39.5 % (ref 34.0–46.6)
Hemoglobin: 13.3 g/dL (ref 11.1–15.9)
IMMATURE GRANULOCYTES: 0 %
Immature Grans (Abs): 0 10*3/uL (ref 0.0–0.1)
LYMPHS ABS: 2 10*3/uL (ref 0.7–3.1)
Lymphs: 25 %
MCH: 30.2 pg (ref 26.6–33.0)
MCHC: 33.7 g/dL (ref 31.5–35.7)
MCV: 90 fL (ref 79–97)
MONOS ABS: 0.7 10*3/uL (ref 0.1–0.9)
Monocytes: 9 %
NEUTROS PCT: 62 %
Neutrophils Absolute: 4.7 10*3/uL (ref 1.4–7.0)
PLATELETS: 265 10*3/uL (ref 150–379)
RBC: 4.41 x10E6/uL (ref 3.77–5.28)
RDW: 14.2 % (ref 12.3–15.4)
WBC: 7.7 10*3/uL (ref 3.4–10.8)

## 2016-10-14 LAB — BASIC METABOLIC PANEL
BUN / CREAT RATIO: 20 (ref 12–28)
BUN: 14 mg/dL (ref 8–27)
CHLORIDE: 103 mmol/L (ref 96–106)
CO2: 22 mmol/L (ref 18–29)
CREATININE: 0.7 mg/dL (ref 0.57–1.00)
Calcium: 9.3 mg/dL (ref 8.7–10.3)
GFR calc Af Amer: 91 mL/min/{1.73_m2} (ref >59)
GFR calc non Af Amer: 79 mL/min/{1.73_m2} (ref >59)
GLUCOSE: 86 mg/dL (ref 65–99)
POTASSIUM: 4.2 mmol/L (ref 3.5–5.2)
SODIUM: 142 mmol/L (ref 134–144)

## 2016-10-14 LAB — TROPONIN I

## 2016-10-14 LAB — TSH: TSH: 3.29 u[IU]/mL (ref 0.450–4.500)

## 2016-10-14 LAB — SEDIMENTATION RATE: Sed Rate: 2 mm/hr (ref 0–40)

## 2016-10-23 DIAGNOSIS — H401131 Primary open-angle glaucoma, bilateral, mild stage: Secondary | ICD-10-CM | POA: Diagnosis not present

## 2016-10-23 DIAGNOSIS — H524 Presbyopia: Secondary | ICD-10-CM | POA: Diagnosis not present

## 2016-10-26 ENCOUNTER — Ambulatory Visit (HOSPITAL_COMMUNITY)
Admission: RE | Admit: 2016-10-26 | Discharge: 2016-10-26 | Disposition: A | Discharge status: Home / Self Care | Payer: Medicare PPO | Source: Ambulatory Visit | Attending: Cardiovascular Disease | Admitting: Cardiovascular Disease

## 2016-10-26 ENCOUNTER — Encounter (HOSPITAL_COMMUNITY): Payer: Medicare PPO

## 2016-10-26 DIAGNOSIS — R0989 Other specified symptoms and signs involving the circulatory and respiratory systems: Secondary | ICD-10-CM

## 2016-11-09 DIAGNOSIS — F4321 Adjustment disorder with depressed mood: Secondary | ICD-10-CM | POA: Diagnosis not present

## 2016-11-14 IMAGING — US US THYROID BIOPSY
1 series · 13 of 17 positions shown · non-contrast
Comparison: Ultrasound done 05/12/2015

CLINICAL DATA: Dominant 11 x 10 x 16 mm solid nodule with
hyperemia, right mid lobe (Previously 10 x 8 x 9). Request for
ultrasound guided fine needle aspirate biopsy.

EXAM:
ULTRASOUND GUIDED NEEDLE ASPIRATE BIOPSY OF THE THYROID GLAND

[Series 1: us thyroid biopsy · 0.06mm/px · 17 acquisitions, 13 frames shown]
[im 1/17]
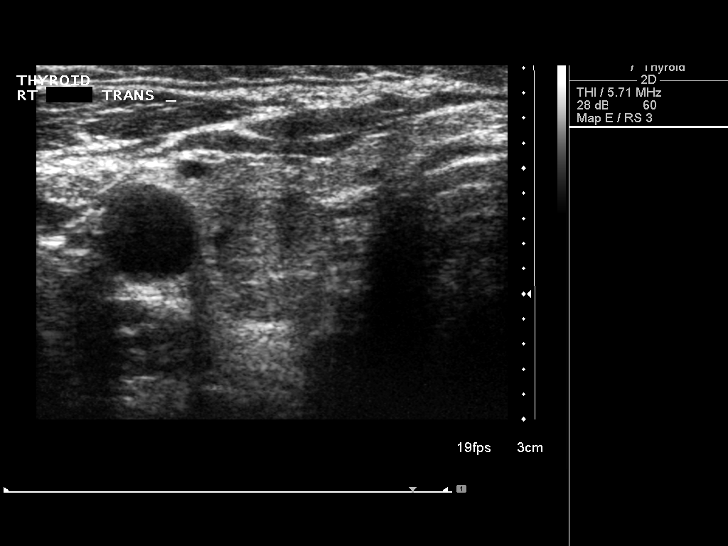
[im 2/17]
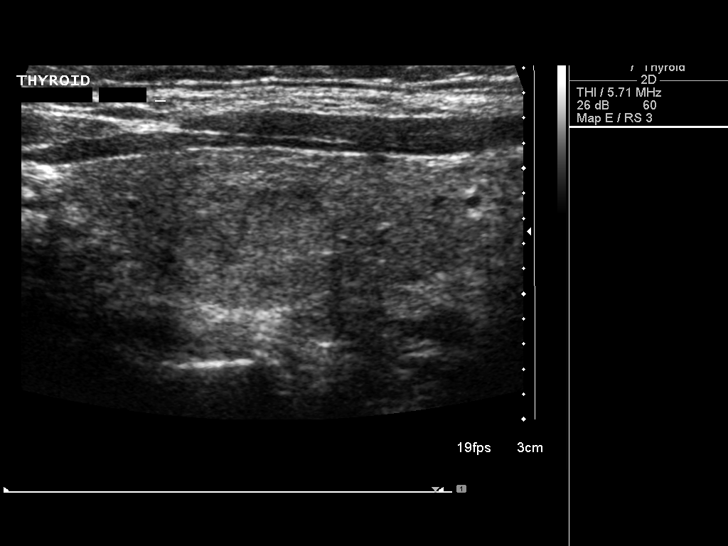
[im 4/17]
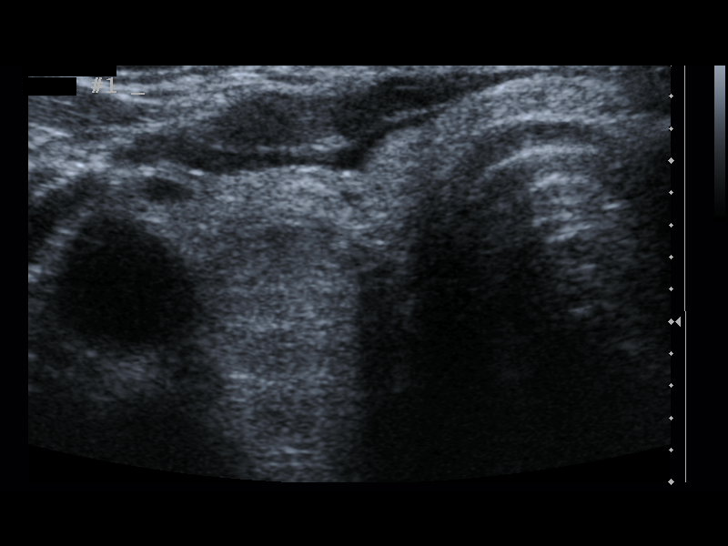
[im 5/17]
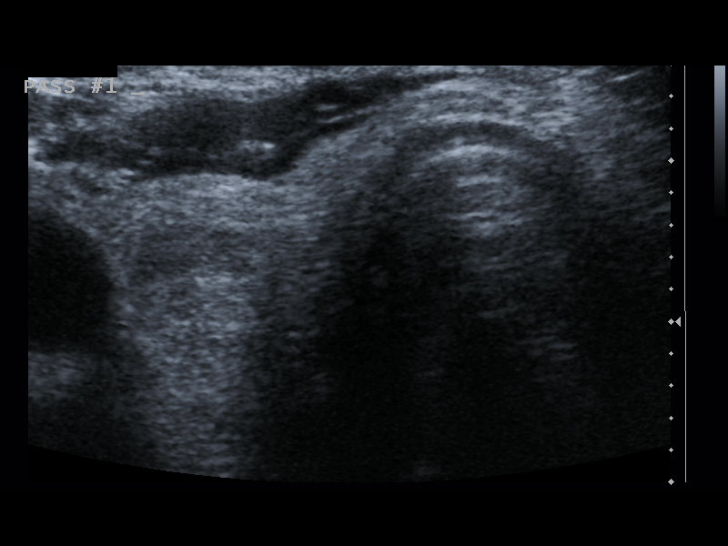
[im 6/17]
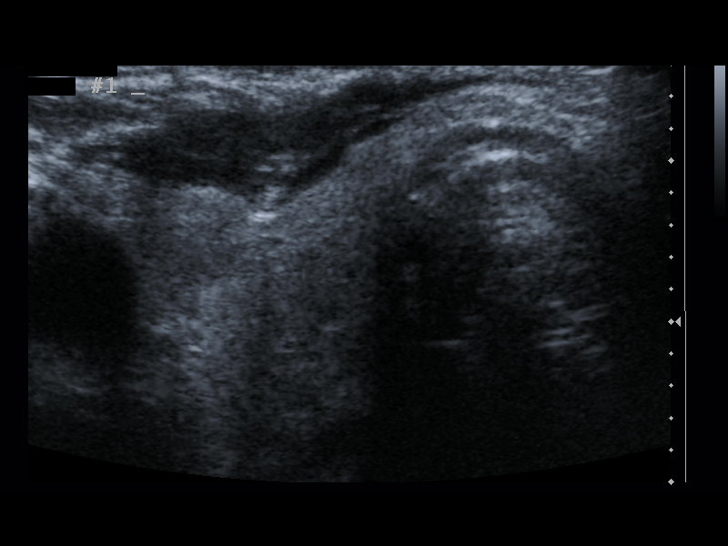
[im 8/17]
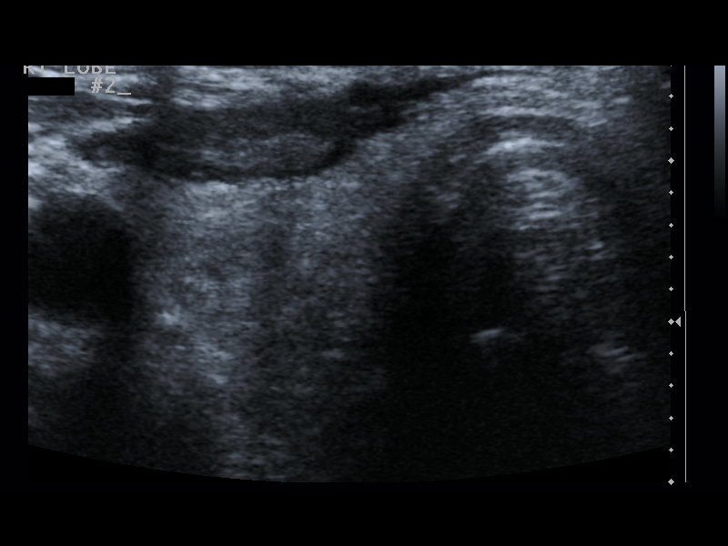
[im 9/17]
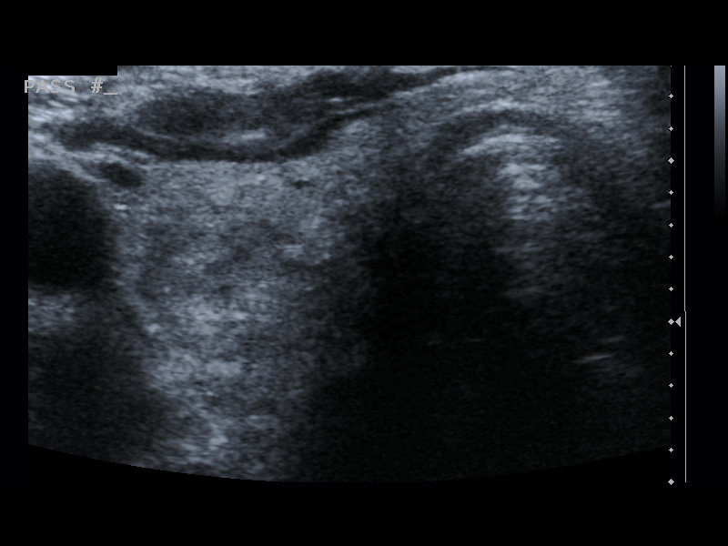
[im 10/17]
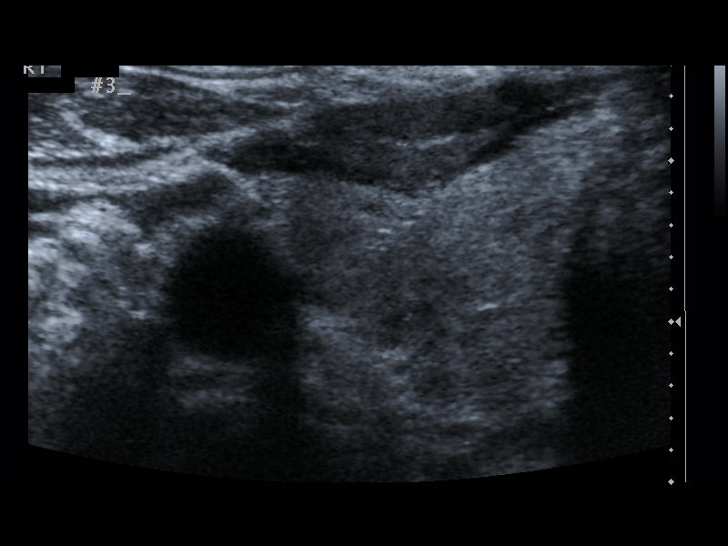
[im 12/17]
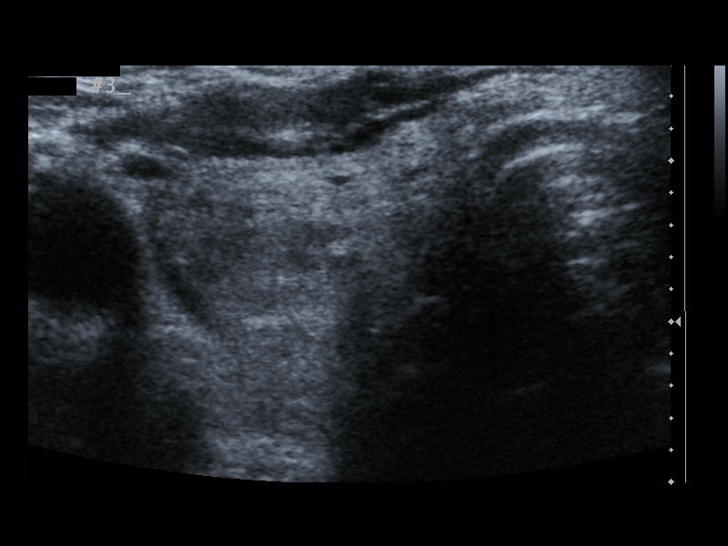
[im 13/17]
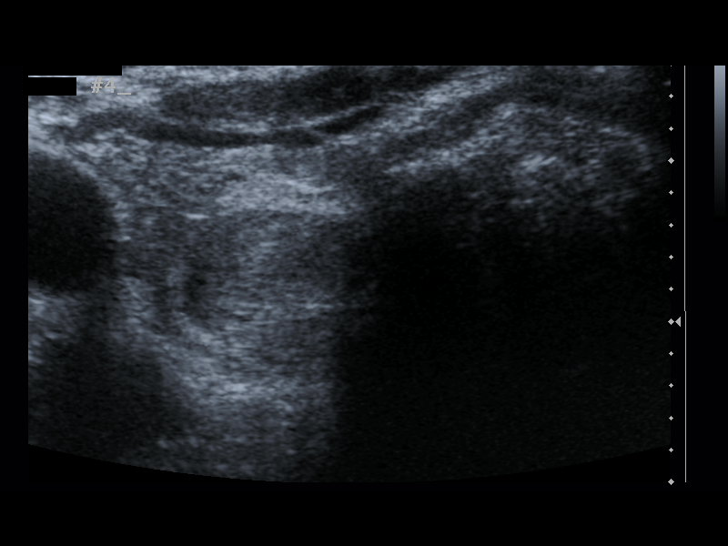
[im 14/17]
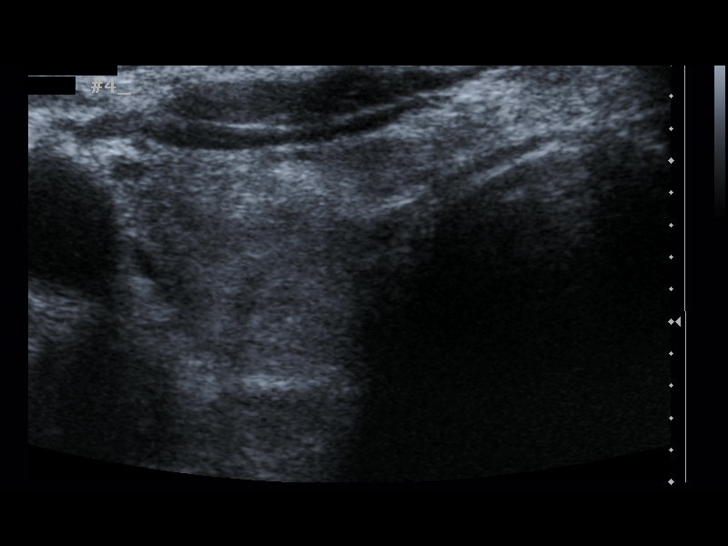
[im 16/17]
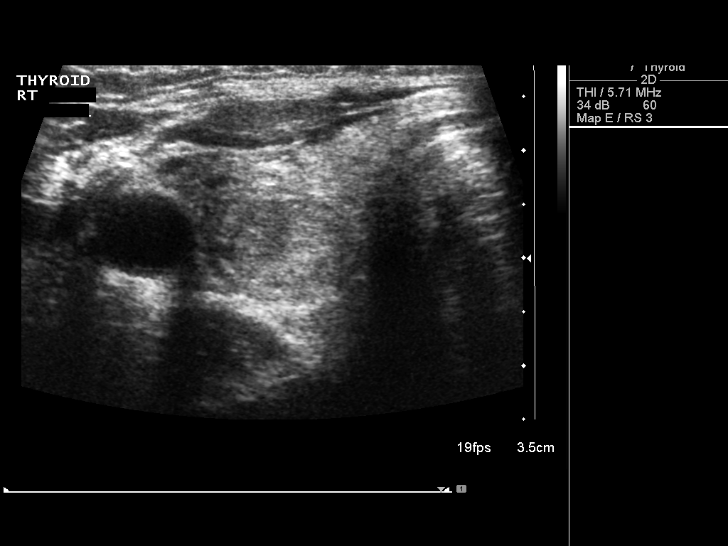
[im 17/17]
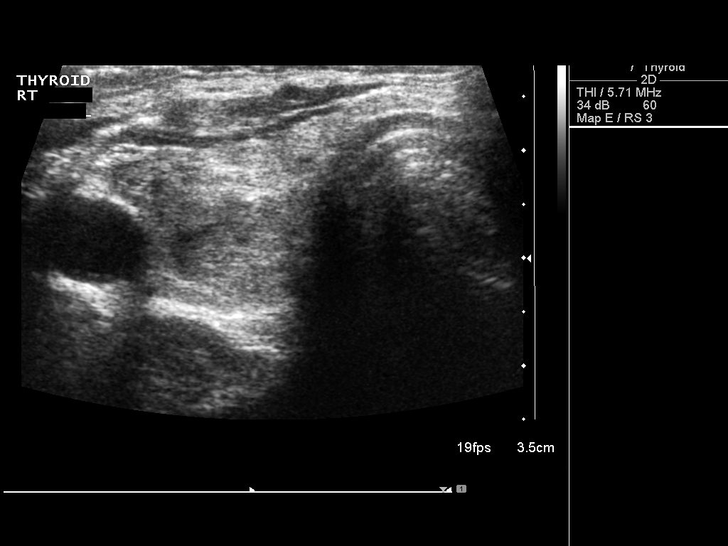

[13 of 17 positions shown; findings below may reference images not displayed]

PROCEDURE:
Thyroid biopsy was thoroughly discussed with the patient and
questions were answered. The benefits, risks, alternatives, and
complications were also discussed. The patient understands and
wishes to proceed with the procedure. Written consent was obtained.

Ultrasound was performed to localize and mark an adequate site for
the biopsy. The patient was then prepped and draped in a normal
sterile fashion. Local anesthesia was provided with 1% lidocaine.
Using direct ultrasound guidance, 4 passes were made using needles
into the nodule within the right lobe of the thyroid. Ultrasound was
used to confirm needle placements on all occasions. Specimens were
sent to Pathology for analysis.

Complications:  None immediate
FINDINGS: Imaging confirms needle placement into the dominant right thyroid
nodule.
IMPRESSION: Ultrasound guided needle aspirate biopsy performed of the dominant
right thyroid nodule.

## 2016-12-04 DIAGNOSIS — M542 Cervicalgia: Secondary | ICD-10-CM | POA: Diagnosis not present

## 2016-12-04 DIAGNOSIS — I480 Paroxysmal atrial fibrillation: Secondary | ICD-10-CM | POA: Diagnosis not present

## 2016-12-19 DIAGNOSIS — C50312 Malignant neoplasm of lower-inner quadrant of left female breast: Secondary | ICD-10-CM | POA: Diagnosis not present

## 2017-01-11 DIAGNOSIS — F4321 Adjustment disorder with depressed mood: Secondary | ICD-10-CM | POA: Diagnosis not present

## 2017-01-15 ENCOUNTER — Encounter: Payer: Self-pay | Admitting: Cardiovascular Disease

## 2017-01-15 ENCOUNTER — Ambulatory Visit (INDEPENDENT_AMBULATORY_CARE_PROVIDER_SITE_OTHER): Payer: Medicare PPO | Admitting: Cardiovascular Disease

## 2017-01-15 VITALS — BP 120/80 | HR 73 | Ht 67.0 in | Wt 164.8 lb

## 2017-01-15 DIAGNOSIS — D225 Melanocytic nevi of trunk: Secondary | ICD-10-CM | POA: Diagnosis not present

## 2017-01-15 DIAGNOSIS — I10 Essential (primary) hypertension: Secondary | ICD-10-CM

## 2017-01-15 DIAGNOSIS — I480 Paroxysmal atrial fibrillation: Secondary | ICD-10-CM

## 2017-01-15 DIAGNOSIS — C44619 Basal cell carcinoma of skin of left upper limb, including shoulder: Secondary | ICD-10-CM | POA: Diagnosis not present

## 2017-01-15 DIAGNOSIS — Z85828 Personal history of other malignant neoplasm of skin: Secondary | ICD-10-CM | POA: Diagnosis not present

## 2017-01-15 DIAGNOSIS — L814 Other melanin hyperpigmentation: Secondary | ICD-10-CM | POA: Diagnosis not present

## 2017-01-15 DIAGNOSIS — L570 Actinic keratosis: Secondary | ICD-10-CM | POA: Diagnosis not present

## 2017-01-15 DIAGNOSIS — L820 Inflamed seborrheic keratosis: Secondary | ICD-10-CM | POA: Diagnosis not present

## 2017-01-15 DIAGNOSIS — L821 Other seborrheic keratosis: Secondary | ICD-10-CM | POA: Diagnosis not present

## 2017-01-15 DIAGNOSIS — D1801 Hemangioma of skin and subcutaneous tissue: Secondary | ICD-10-CM | POA: Diagnosis not present

## 2017-01-15 DIAGNOSIS — C44319 Basal cell carcinoma of skin of other parts of face: Secondary | ICD-10-CM | POA: Diagnosis not present

## 2017-01-15 NOTE — Progress Notes (Signed)
Cardiology Office Note   Date:  01/15/2017   ID:  FEMALE IAFRATE, DOB 05/18/1929, MRN [id number]  PCP:  Lajean Manes, MD  Cardiologist:  Mertie Moores, MD   Chief Complaint  Patient presents with  . Hypertension   1. Paroxysmal atrial fibrillation 2. Thyroid nodules 3. Hx of Hypertension   Previous notes:   Marlayna is an 83 year old female (mother of Hazley Dezeeuw) who is seen today for further evaluation of paroxysmal atrial fibrillation. She has had smptoms for years - palps off and on for years. The palpitations are sporatic. She thinks they may be related to stress. She has a sensation in her left ribs. She is able to push through the palpitations and denies any CP or dyspnea associated with the arrhythmia. These palpitatioins   Recently she had some dyspnea at the beach and last week when she went to see Dr. Felipa Eth. She seems to have some slight dizziness and unsteadiness recently.   She has had HTN in the past - resolved with a better diet and exercise and weight loss program. BP now is ok.  She goes to the [employer] sne[employer] program And measures her BP several times a week. She has been having night sweats for the past several years.   Dec. 3, 2014:  Laysha is seen today with her daughter Jamira Barfuss). She has tapered her Diltiazem 30 mg a day because of dizziness and nausea. We also tried slow release Cardizem but she had dizziness and nausea with that as well.   October 31, 2013:  Mylani is doing well. Has a few palpitations - no prolonged episodes of Afib  04/01/2014:  Briea is seen today for a followup of her paroxysmal atrial fibrillation. She also has a history of hypertension. She has been depressed since 2023/03/11 after her husband died. She has had lots of burning and tingling sensation in her legs - especially at night.  She was seen by Christell Faith in the [place] office. Arterial ( ABI ) was normal. Labs were unremarkable.    She finds that her peripheral her neuropathy symptoms would typically resolve if she moves her legs around in a bicycle type pattern.   Sep 21, 2014:  : MYEISHA KRUSER is a 83 y.o. female who presents for her hypertension and atrial fibrillation.  She is doing well. She has had some palpitations at night.  Lasted only a few minutes.  Took several deep breaths and felt better.  Turned over and felt better. Has had some nose bleeds in January.  No further episodes since .   Oct 05, 2015:  Doing well.  HR and BP are well controlled. Occasionally has palpitations .  Only transient ,  Not associated with any worsening of the dyspnea Palpitations are very brief.   Nov. 2, 2017:  Doing well.  Rare occasions she has an unusual sensation in her chest. She does not describe his chest pain but says that that there is deftly something in her chest that feels somewhat uncomfortable. She has at least mild dementia and it's difficult for her to tell a lot of her symptoms.  Gets leg cramps if she forgets her meds  Has been going to [facility]  on occasion, 1-2 times a week   Feb. 2, 2018:  No complaints.  She had some ECG changes seen at her last visit.  myoview was low risk  She was taking the Eliquis only once a day .  She was  bruising and had a nose bleed ( this was 2 years ago)   10/13/2016:  Shaneta is seen today as a work in visit for shoulder pain and neck pain.  Having primarily right neck pain .  Not worsened with moving her right arm.   Not worsened with walking or climbing stairs . No associated shortness of breath, no diaphoresis.  Lasts for a few seconds.  Off and on through the day  Has been there for the past day or so  Has had some right jaw pain with a tooth ache today   Is only taking the Eliquis once a day   Aug. 27, 2018:  Natsumi is seen today .   She is here alone. [name] is having PT for an ankle injury.  Has not had any palpitations to suggest atrial fib.   Past  Medical History:  Diagnosis Date  . A-fib ([place])   . Anemia   . Anxiety   . Arthritis    "stiffness and soreness in my joints; particularly knee joints and fingers" (04/08/2015)  . Cancer of left breast ([place]) dx'd 06/2013  . Depression    still grieving husband's death from 03-17-2014  . Hepatitis A   . History of blood transfusion 1956   "related to the SALPINGOOPHORECTOMY"  . Hypertension   . Multiple thyroid nodules    "very small; dx'd w/carotid doppler study"  . PONV (postoperative nausea and vomiting) 1956   none since  . Scintillating scotoma of right eye dx'd 02/2015  . Shortness of breath dyspnea    with exertion or excitement  . Walking pneumonia X 1   "self dx'd"    Past Surgical History:  Procedure Laterality Date  . APPENDECTOMY  1956  . BASAL CELL CARCINOMA EXCISION   09/2014   "forehead"  . BREAST BIOPSY Left 07/16/2013  . BREAST LUMPECTOMY Left 04/08/2015   WITH RADIOACTIVE SEED LOCALIZATION   . BREAST LUMPECTOMY WITH RADIOACTIVE SEED LOCALIZATION Left 04/08/2015   Procedure: BREAST LUMPECTOMY WITH RADIOACTIVE SEED LOCALIZATION;  Surgeon: Rolm Bookbinder, MD;  Location: [facility];  Service: General;  Laterality: Left;  . CATARACT EXTRACTION W/ INTRAOCULAR LENS IMPLANT Right   . FRACTURE SURGERY    . SALPINGOOPHORECTOMY Right 1956  . SQUAMOUS CELL CARCINOMA EXCISION   09/2014   "forehead"  . TONSILLECTOMY AND ADENOIDECTOMY    . WRIST FRACTURE SURGERY Left July 2010   titanium     Current Outpatient Prescriptions  Medication Sig Dispense Refill  . acetaminophen (TYLENOL) 325 MG tablet Take 325 mg by mouth daily as needed (pain).    Marland Kitchen alendronate (FOSAMAX) 35 MG tablet Take 1 tablet (35 mg total) by mouth every 14 (fourteen) days. Take with a full glass of water on an empty stomach. 24 tablet 6  . anastrozole (ARIMIDEX) 1 MG tablet Take 1 tablet (1 mg total) by mouth daily. 90 tablet 0  . apixaban (ELIQUIS) 5 MG TABS tablet Take 1 tablet (5 mg total) by  mouth 2 (two) times daily. 60 tablet 11  . Calcium-Magnesium-Vitamin D 600-40-500 MG-MG-UNIT TB24 Take 1,200 mEq by mouth.    . Cholecalciferol (VITAMIN D3) 2000 UNITS TABS Take 2,000 Units by mouth daily.    . Coenzyme Q10 (CO Q 10) 100 MG CAPS Take 200 mg by mouth daily.    Marland Kitchen CRANBERRY PO Take 1 tablet by mouth every other day.     . diltiazem (CARDIZEM) 30 MG tablet Take 1 tablet (30 mg total) by mouth  daily. 90 tablet 3  . Multiple Vitamin (MULTIVITAMIN WITH MINERALS) TABS tablet Take 1 tablet by mouth daily.     No current facility-administered medications for this visit.     Allergies:   Sulfonamide derivatives; Other; and Amoxicillin    Social History:  The patient  reports that she has never smoked. She has never used smokeless tobacco. She reports that she does not drink alcohol or use drugs.   Family History:  The patient's family history includes Arrhythmia in her mother; Cancer in her mother and sister; Hyperlipidemia in her mother; Hypertension in her mother.    ROS:  Please see the history of present illness.    Review of Systems: Constitutional:  denies fever, chills, diaphoresis, appetite change and fatigue.  HEENT: denies photophobia, eye pain, redness, hearing loss, ear pain, congestion, sore throat, rhinorrhea, sneezing, neck pain, neck stiffness and tinnitus.  Respiratory: denies SOB, DOE, cough, chest tightness, and wheezing.  Cardiovascular: denies chest pain, palpitations and leg swelling.  Gastrointestinal: denies nausea, vomiting, abdominal pain, diarrhea, constipation, blood in stool.  Genitourinary: denies dysuria, urgency, frequency, hematuria, flank pain and difficulty urinating.  Musculoskeletal: denies  myalgias, back pain, joint swelling, arthralgias and gait problem.   Skin: denies pallor, rash and wound.  Neurological: denies dizziness, seizures, syncope, weakness, light-headedness, numbness and headaches.   Hematological: denies adenopathy, easy  bruising, personal or family bleeding history.  Psychiatric/ Behavioral: denies suicidal ideation, mood changes, confusion, nervousness, sleep disturbance and agitation.       All other systems are reviewed and negative.    PHYSICAL EXAM: VS:  BP 120/80   Pulse 73   Ht 5\' 7"  (1.702 m)   Wt 164 lb 12.8 oz (74.8 kg)   SpO2 96%   BMI 25.81 kg/m  , BMI Body mass index is 25.81 kg/m. GEN: Well nourished, well developed, in no acute distress  HEENT: normal  Neck: no JVD, carotid bruits, or masses Cardiac: RRR; no murmurs, rubs, or gallops,no edema  Respiratory:  clear to auscultation bilaterally, normal work of breathing GI: soft, nontender, nondistended, + BS MS: no deformity or atrophy  Skin: warm and dry, no rash Neuro:  Strength and sensation are intact Psych: normal   EKG:  EKG is ordered today. Oct 13, 2016:   NSR with  sinus arrhythmia,  LAD      Recent Labs: 03/30/2016: ALT 17 10/13/2016: BUN 14; Creatinine, Ser 0.70; Hemoglobin 13.3; Platelets 265; Potassium 4.2; Sodium 142; TSH 3.290    Lipid Panel    Component Value Date/Time   CHOL 150 03/23/2016 1053   TRIG 229 (H) 03/23/2016 1053   HDL 30 (L) 03/23/2016 1053   CHOLHDL 5.0 03/23/2016 1053   VLDL 46 (H) 03/23/2016 1053   LDLCALC 74 03/23/2016 1053      Wt Readings from Last 3 Encounters:  01/15/17 164 lb 12.8 oz (74.8 kg)  10/13/16 164 lb 12.8 oz (74.8 kg)  06/23/16 166 lb (75.3 kg)      Other studies Reviewed: Additional studies/ records that were reviewed today include: . Review of the above records demonstrates:    ASSESSMENT AND PLAN:  1. Shoulder pain :   Layli is seen today as a work in visit for chest pain/shoulder pain with radiation to the neck. She presents with some vague episodes of chest and neck pain. There is no associated blurry vision or visual changes. The history is somewhat difficult because of her mild to moderate dementia. Her symptoms do not sound  cardiac in origin. We have  done a stress test in November, 2017 which was negative.    2. Paroxysmal atrial fibrillation- is maintaining NSR , doing well.     Continue same meds  3. Hx of Hypertension -  BP is well controlloled. Continue diet and medication   Current medicines are reviewed at length with the patient today.  The patient has concerns regarding medicines.  The following changes have been made:  no change  Labs/ tests ordered today include:   No orders of the defined types were placed in this encounter.  Disposition:   FU with me in 6 months     Mertie Moores, MD  01/15/2017 9:28 AM    [facility] [address], [place], [place]  [address] Phone: [phone]; Fax: [phone]

## 2017-01-15 NOTE — Patient Instructions (Signed)

## 2017-01-30 DIAGNOSIS — H401131 Primary open-angle glaucoma, bilateral, mild stage: Secondary | ICD-10-CM | POA: Diagnosis not present

## 2017-02-09 DIAGNOSIS — F4321 Adjustment disorder with depressed mood: Secondary | ICD-10-CM | POA: Diagnosis not present

## 2017-02-10 ENCOUNTER — Other Ambulatory Visit: Payer: Self-pay | Admitting: Cardiovascular Disease

## 2017-02-12 ENCOUNTER — Other Ambulatory Visit: Payer: Self-pay | Admitting: Oncology

## 2017-02-12 DIAGNOSIS — Z853 Personal history of malignant neoplasm of breast: Secondary | ICD-10-CM

## 2017-02-23 DIAGNOSIS — Z85828 Personal history of other malignant neoplasm of skin: Secondary | ICD-10-CM | POA: Diagnosis not present

## 2017-02-23 DIAGNOSIS — L438 Other lichen planus: Secondary | ICD-10-CM | POA: Diagnosis not present

## 2017-02-23 DIAGNOSIS — C44319 Basal cell carcinoma of skin of other parts of face: Secondary | ICD-10-CM | POA: Diagnosis not present

## 2017-03-05 ENCOUNTER — Ambulatory Visit
Admission: RE | Admit: 2017-03-05 | Discharge: 2017-03-05 | Disposition: A | Discharge status: Home / Self Care | Payer: Medicare PPO | Source: Ambulatory Visit | Attending: Oncology | Admitting: Oncology

## 2017-03-05 DIAGNOSIS — R928 Other abnormal and inconclusive findings on diagnostic imaging of breast: Secondary | ICD-10-CM | POA: Diagnosis not present

## 2017-03-05 DIAGNOSIS — Z853 Personal history of malignant neoplasm of breast: Secondary | ICD-10-CM

## 2017-03-05 HISTORY — DX: Personal history of irradiation: Z92.3

## 2017-03-08 DIAGNOSIS — Z23 Encounter for immunization: Secondary | ICD-10-CM | POA: Diagnosis not present

## 2017-03-22 ENCOUNTER — Other Ambulatory Visit: Payer: Medicare PPO

## 2017-03-29 ENCOUNTER — Ambulatory Visit: Payer: Medicare PPO | Admitting: Oncology

## 2017-06-01 DIAGNOSIS — H401131 Primary open-angle glaucoma, bilateral, mild stage: Secondary | ICD-10-CM | POA: Diagnosis not present

## 2017-06-06 DIAGNOSIS — C50912 Malignant neoplasm of unspecified site of left female breast: Secondary | ICD-10-CM | POA: Diagnosis not present

## 2017-06-06 DIAGNOSIS — Z1389 Encounter for screening for other disorder: Secondary | ICD-10-CM | POA: Diagnosis not present

## 2017-06-06 DIAGNOSIS — M810 Age-related osteoporosis without current pathological fracture: Secondary | ICD-10-CM | POA: Diagnosis not present

## 2017-06-06 DIAGNOSIS — E041 Nontoxic single thyroid nodule: Secondary | ICD-10-CM | POA: Diagnosis not present

## 2017-06-06 DIAGNOSIS — I482 Chronic atrial fibrillation: Secondary | ICD-10-CM | POA: Diagnosis not present

## 2017-06-06 DIAGNOSIS — Z79899 Other long term (current) drug therapy: Secondary | ICD-10-CM | POA: Diagnosis not present

## 2017-06-06 DIAGNOSIS — E7800 Pure hypercholesterolemia, unspecified: Secondary | ICD-10-CM | POA: Diagnosis not present

## 2017-06-06 DIAGNOSIS — Z170 Estrogen receptor positive status [ER+]: Secondary | ICD-10-CM | POA: Diagnosis not present

## 2017-06-06 DIAGNOSIS — Z0000 Encounter for general adult medical examination without abnormal findings: Secondary | ICD-10-CM | POA: Diagnosis not present

## 2017-06-14 ENCOUNTER — Ambulatory Visit (INDEPENDENT_AMBULATORY_CARE_PROVIDER_SITE_OTHER): Payer: Medicare PPO | Admitting: Clinical

## 2017-06-14 ENCOUNTER — Ambulatory Visit: Payer: Medicare PPO | Admitting: Clinical

## 2017-06-14 DIAGNOSIS — F4323 Adjustment disorder with mixed anxiety and depressed mood: Secondary | ICD-10-CM | POA: Diagnosis not present

## 2017-07-03 ENCOUNTER — Other Ambulatory Visit: Payer: Self-pay | Admitting: Oncology

## 2017-07-10 ENCOUNTER — Ambulatory Visit: Payer: Self-pay | Admitting: Physician Assistant

## 2017-07-12 ENCOUNTER — Ambulatory Visit (INDEPENDENT_AMBULATORY_CARE_PROVIDER_SITE_OTHER): Payer: Medicare PPO | Admitting: Clinical

## 2017-07-12 DIAGNOSIS — F4323 Adjustment disorder with mixed anxiety and depressed mood: Secondary | ICD-10-CM

## 2017-07-17 ENCOUNTER — Ambulatory Visit: Payer: Medicare PPO | Admitting: Physician Assistant

## 2017-07-17 ENCOUNTER — Encounter: Payer: Self-pay | Admitting: Physician Assistant

## 2017-07-17 VITALS — BP 134/72 | HR 73 | Ht 67.0 in | Wt 162.8 lb

## 2017-07-17 DIAGNOSIS — I480 Paroxysmal atrial fibrillation: Secondary | ICD-10-CM

## 2017-07-17 DIAGNOSIS — I10 Essential (primary) hypertension: Secondary | ICD-10-CM

## 2017-07-17 MED ORDER — APIXABAN 5 MG PO TABS: 5.0000 mg | ORAL_TABLET | Freq: Two times a day (BID) | ORAL | 3 refills | Status: DC

## 2017-07-17 MED ORDER — DILTIAZEM HCL 30 MG PO TABS: 30.0000 mg | ORAL_TABLET | Freq: Every day | ORAL | 3 refills | Status: DC

## 2017-07-17 NOTE — Patient Instructions (Addendum)
Medication Instructions:  No changes.  I have sent a refill to your pharmacy for Eliquis and Diltiazem.  Labwork: None   Testing/Procedures: None   Follow-Up: Your physician wants you to follow-up in: [place], PAC.Marland Kitchen You will receive a reminder letter in the mail two months in advance. If you don't receive a letter, please call our office to schedule the follow-up appointment.   Any Other Special Instructions Will Be Listed Below (If Applicable).  If you need a refill on your cardiac medications before your next appointment, please call your pharmacy.

## 2017-07-17 NOTE — Progress Notes (Signed)
Cardiology Office Note:    Date:  07/17/2017   ID:  Terri Ponce, DOB 1928/08/12, MRN [id number]  PCP:  Lajean Manes, MD  Cardiologist:  Mertie Moores, MD   Referring MD: Lajean Manes, MD   Chief Complaint  Patient presents with  . Follow-up    atrial fibrillation    History of Present Illness:    Terri Ponce is a 84 y.o. female with a hx of hypertension, paroxysmal atrial fibrillation.  She has been intolerant to higher dose diltiazem in the past.  Last seen by Dr. Acie Fredrickson August 2018.  CHADS2-VASc=4 (hypertension, female, 84 yo).     [name] returns for follow up.  She is here with her daughter.  She is doing well.  She denies chest pain, shortness of breath, syncope, orthopnea, PND or significant pedal edema.  She does have leg cramps and constantly has to move her legs.  She denies claudication symptoms.    Prior CV studies:   The following studies were reviewed today:  Carotid US 10/26/16 Bilateral ICA 1-39; normal bilateral subclavian arteries; patent vertebral arteries >> FU as needed  Nuclear stress test 04/05/16 EF 67, normal perfusion, low risk  ABIs 03/17/14 Normal   Echo 01/16/13 Mild focal basal septal hypertrophy, EF 60-65, normal wall motion, grade 1 diastolic dysfunction, trivial AI  Past Medical History:  Diagnosis Date  . A-fib ([place])   . Anemia   . Anxiety   . Arthritis    "stiffness and soreness in my joints; particularly knee joints and fingers" (04/08/2015)  . Breast cancer ([place])   . Cancer of left breast ([place]) dx'd 06/2013  . Depression    still grieving husband's death from 02-19-2014  . Hepatitis A   . History of blood transfusion 1956   "related to the SALPINGOOPHORECTOMY"  . Hypertension   . Multiple thyroid nodules    "very small; dx'd w/carotid doppler study"  . Personal history of radiation therapy   . PONV (postoperative nausea and vomiting) 1956   none since  . Scintillating scotoma of right eye dx'd 02/2015  .  Shortness of breath dyspnea    with exertion or excitement  . Walking pneumonia X 1   "self dx'd"    Past Surgical History:  Procedure Laterality Date  . APPENDECTOMY  1956  . BASAL CELL CARCINOMA EXCISION   09/2014   "forehead"  . BREAST BIOPSY Left 07/16/2013  . BREAST LUMPECTOMY Left 04/08/2015   WITH RADIOACTIVE SEED LOCALIZATION   . BREAST LUMPECTOMY WITH RADIOACTIVE SEED LOCALIZATION Left 04/08/2015   Procedure: BREAST LUMPECTOMY WITH RADIOACTIVE SEED LOCALIZATION;  Surgeon: Rolm Bookbinder, MD;  Location: [facility];  Service: General;  Laterality: Left;  . CATARACT EXTRACTION W/ INTRAOCULAR LENS IMPLANT Right   . FRACTURE SURGERY    . SALPINGOOPHORECTOMY Right 1956  . SQUAMOUS CELL CARCINOMA EXCISION   09/2014   "forehead"  . TONSILLECTOMY AND ADENOIDECTOMY    . WRIST FRACTURE SURGERY Left July 2010   titanium    Current Medications: Current Meds  Medication Sig  . acetaminophen (TYLENOL) 325 MG tablet Take 325 mg by mouth daily as needed (pain).  Marland Kitchen alendronate (FOSAMAX) 35 MG tablet Take 1 tablet (35 mg total) by mouth every 14 (fourteen) days. Take with a full glass of water on an empty stomach.  Marland Kitchen anastrozole (ARIMIDEX) 1 MG tablet Take 1 tablet (1 mg total) by mouth daily.  Marland Kitchen apixaban (ELIQUIS) 5 MG TABS tablet Take 1 tablet (5 mg  total) by mouth 2 (two) times daily.  . Calcium-Magnesium-Vitamin D 600-40-500 MG-MG-UNIT TB24 Take 1,200 mEq by mouth.  . Cholecalciferol (VITAMIN D3) 2000 UNITS TABS Take 2,000 Units by mouth daily.  . Coenzyme Q10 (CO Q 10) 100 MG CAPS Take 200 mg by mouth daily.  Marland Kitchen CRANBERRY PO Take 1 tablet by mouth every other day.   . diltiazem (CARDIZEM) 30 MG tablet Take 1 tablet (30 mg total) by mouth daily.  . Multiple Vitamin (MULTIVITAMIN WITH MINERALS) TABS tablet Take 1 tablet by mouth daily.     Allergies:   Sulfonamide derivatives; Other; Sulfacetamide; and Amoxicillin   Social History   Tobacco Use  . Smoking status: Never Smoker  .  Smokeless tobacco: Never Used  Substance Use Topics  . Alcohol use: No  . Drug use: No     Family Hx: The patient's family history includes Arrhythmia in her mother; Cancer in her mother and sister; Hyperlipidemia in her mother; Hypertension in her mother.  ROS:   Please see the history of present illness.    ROS All other systems reviewed and are negative.   EKGs/Labs/Other Test Reviewed:    EKG:  EKG is  ordered today.  The ekg ordered today demonstrates normal sinus rhythm, heart rate 73, left axis deviation, nonspecific ST-T wave changes, QTC 412 ms, no change from prior tracing  Recent Labs: 10/13/2016: BUN 14; Creatinine, Ser 0.70; Hemoglobin 13.3; Platelets 265; Potassium 4.2; Sodium 142; TSH 3.290   Obtained from [facility] KPN Tool: Cholesterol, total  155.000  06/06/2017 HDL    32.000  06/06/2017 LDL    93.000  06/06/2017 Triglycerides   151.000  06/06/2017 Hemoglobin   13.300  06/06/2017 Creatinine, Serum  0.690   06/06/2017 Potassium   4.400   06/06/2017 ALT (SGPT)   13.000  06/06/2017 TSH    2.190   06/06/2017  Recent Lipid Panel Lab Results  Component Value Date/Time   CHOL 150 03/23/2016 10:53 AM   TRIG 229 (H) 03/23/2016 10:53 AM   HDL 30 (L) 03/23/2016 10:53 AM   CHOLHDL 5.0 03/23/2016 10:53 AM   LDLCALC 74 03/23/2016 10:53 AM    Physical Exam:    VS:  BP 134/72   Pulse 73   Ht 5\' 7"  (1.702 m)   Wt 162 lb 12.8 oz (73.8 kg)   SpO2 95%   BMI 25.50 kg/m     Wt Readings from Last 3 Encounters:  07/17/17 162 lb 12.8 oz (73.8 kg)  01/15/17 164 lb 12.8 oz (74.8 kg)  10/13/16 164 lb 12.8 oz (74.8 kg)     Physical Exam  Constitutional: She is oriented to person, place, and time. She appears well-developed and well-nourished. No distress.  HENT:  Head: Normocephalic and atraumatic.  Neck: No JVD present. Carotid bruit is not present.  Cardiovascular: Normal rate and regular rhythm.  No murmur heard. Pulmonary/Chest: Effort normal. She has no rales.  Abdominal:  Soft.  Musculoskeletal: She exhibits no edema.  Neurological: She is alert and oriented to person, place, and time.  Skin: Skin is warm and dry.    ASSESSMENT & PLAN:    1.  PAF (paroxysmal atrial fibrillation) (HCC)  Maintaining normal sinus rhythm.  Recent hemoglobin and creatinine with PCP were normal.  Continue current dose of Apixaban (Eliquis) 5 mg Twice daily (wt > 60 kg, SCr < 1.5).  Continue current dose of Diltiazem.  Plan follow up in 6 months.   2.  Essential hypertension The patient's blood  pressure is controlled on her current regimen.  Continue current therapy.     Dispo:  Return in about 6 months (around 01/14/2018) for Routine Follow Up, w/ Dr. Acie Fredrickson, or Richardson Dopp, PA-C.   Medication Adjustments/Labs and Tests Ordered: Current medicines are reviewed at length with the patient today.  Concerns regarding medicines are outlined above.  Tests Ordered: Orders Placed This Encounter  Procedures  . EKG 12-Lead   Medication Changes: Meds ordered this encounter  Medications  . apixaban (ELIQUIS) 5 MG TABS tablet    Sig: Take 1 tablet (5 mg total) by mouth 2 (two) times daily.    Dispense:  180 tablet    Refill:  3    Order Specific Question:   Supervising Provider    Answer:   Marlou Porch, MARK C [[id number]]  . diltiazem (CARDIZEM) 30 MG tablet    Sig: Take 1 tablet (30 mg total) by mouth daily.    Dispense:  90 tablet    Refill:  3    Order Specific Question:   Supervising Provider    Answer:   Jerline Pain [[id number]]    Signed, Richardson Dopp, PA-C  07/17/2017 1:09 PM    [facility] [address], [place], [place]  [address] Phone: [phone]; Fax: [phone]

## 2017-07-23 DIAGNOSIS — R300 Dysuria: Secondary | ICD-10-CM | POA: Diagnosis not present

## 2017-07-30 ENCOUNTER — Ambulatory Visit: Payer: Medicare PPO | Admitting: Cardiovascular Disease

## 2017-07-30 ENCOUNTER — Encounter: Payer: Self-pay | Admitting: Cardiovascular Disease

## 2017-07-30 VITALS — BP 122/58 | HR 74 | Ht 67.0 in | Wt 164.6 lb

## 2017-07-30 DIAGNOSIS — M7989 Other specified soft tissue disorders: Secondary | ICD-10-CM | POA: Diagnosis not present

## 2017-07-30 DIAGNOSIS — I480 Paroxysmal atrial fibrillation: Secondary | ICD-10-CM | POA: Diagnosis not present

## 2017-07-30 DIAGNOSIS — S83421A Sprain of lateral collateral ligament of right knee, initial encounter: Secondary | ICD-10-CM | POA: Diagnosis not present

## 2017-07-30 NOTE — Patient Instructions (Signed)

## 2017-07-30 NOTE — Progress Notes (Signed)
Cardiology Office Note   Date:  07/30/2017   ID:  Terri Ponce, DOB [birth year], MRN [id number]  PCP:  Terri Manes, MD  Cardiologist:  Terri Moores, MD   Chief Complaint  Patient presents with  . Leg Swelling  . Hypertension  . Atrial Fibrillation   1. Paroxysmal atrial fibrillation 2. Thyroid nodules 3. Hx of Hypertension   Previous notes:   Terri Ponce is an 84 year old female ([name]) who is seen today for further evaluation of paroxysmal atrial fibrillation. She has had smptoms for years - palps off and on for years. The palpitations are sporatic. She thinks they may be related to stress. She has a sensation in her left ribs. She is able to push through the palpitations and denies any CP or dyspnea associated with the arrhythmia. These palpitatioins   Recently she had some dyspnea at the beach and last week when she went to see Dr. Felipa Ponce. She seems to have some slight dizziness and unsteadiness recently.   She has had HTN in the past - resolved with a better diet and exercise and weight loss program. BP now is ok.  She goes to the silver sneakers program And measures her BP several times a week. She has been having night sweats for the past several years.   Dec. 3, 2014:  Terri Ponce is seen today with her daughter Terri Ponce). She has tapered her Diltiazem 30 mg a day because of dizziness and nausea. We also tried slow release Cardizem but she had dizziness and nausea with that as well.   October 31, 2013:  Terri Ponce is doing well. Has a few palpitations - no prolonged episodes of Afib  04/01/2014:  Terri Ponce is seen today for a followup of her paroxysmal atrial fibrillation. She also has a history of hypertension. She has been depressed since 2023-03-03 after her husband died. She has had lots of burning and tingling sensation in her legs - especially at night.  She was seen by Terri Ponce in the [place] office. Arterial ( ABI )  was normal. Labs were unremarkable.   She finds that her peripheral her neuropathy symptoms would typically resolve if she moves her legs around in a bicycle type pattern.   Sep 21, 2014:  : Terri Ponce is a 84 y.o. female who presents for her hypertension and atrial fibrillation.  She is doing well. She has had some palpitations at night.  Lasted only a few minutes.  Took several deep breaths and felt better.  Turned over and felt better. Has had some nose bleeds in January.  No further episodes since .   Oct 05, 2015:  Doing well.  HR and BP are well controlled. Occasionally has palpitations .  Only transient ,  Not associated with any worsening of the dyspnea Palpitations are very brief.   Nov. 2, 2017:  Doing well.  Rare occasions she has an unusual sensation in her chest. She does not describe his chest pain but says that that there is deftly something in her chest that feels somewhat uncomfortable. She has at least mild dementia and it's difficult for her to tell a lot of her symptoms.  Gets leg cramps if she forgets her meds  Has been going to [facility]  on occasion, 1-2 times a week   Feb. 2, 2018:  No complaints.  She had some ECG changes seen at her last visit.  myoview was low risk  She was taking the Eliquis  only once a day .  She was bruising and had a nose bleed ( this was 2 years ago)   10/13/2016:  Terri Ponce is seen today as a work in visit for shoulder pain and neck pain.  Having primarily right neck pain .  Not worsened with moving her right arm.   Not worsened with walking or climbing stairs . No associated shortness of breath, no diaphoresis.  Lasts for a few seconds.  Off and on through the day  Has been there for the past day or so  Has had some right jaw pain with a tooth ache today   Is only taking the Eliquis once a day   Aug. 27, 2018:  Terri Ponce is seen today .   She is here alone. Terri Ponce is having PT for an ankle injury.  Has not had any  palpitations to suggest atrial fib.   July 30, 2017: Terri Ponce is seen today as a work in visit.  She has had some leg swelling for the past week or so. Has had righ leg / knee swelling for the past 2-3 days . Is painful to bend   Just finished a course of Cipro  Past Medical History:  Diagnosis Date  . A-fib ([place])   . Anemia   . Anxiety   . Arthritis    "stiffness and soreness in my joints; particularly knee joints and fingers" (04/08/2015)  . Breast cancer ([facility])   . Cancer of left breast ([place]) dx'd 06/2013  . Depression    still grieving husband's death from 02-24-14  . Hepatitis A   . History of blood transfusion 1956   "related to the SALPINGOOPHORECTOMY"  . Hypertension   . Multiple thyroid nodules    "very small; dx'd w/carotid doppler study"  . Personal history of radiation therapy   . PONV (postoperative nausea and vomiting) 1956   none since  . Scintillating scotoma of right eye dx'd 02/2015  . Shortness of breath dyspnea    with exertion or excitement  . Walking pneumonia X 1   "self dx'd"    Past Surgical History:  Procedure Laterality Date  . APPENDECTOMY  1956  . BASAL CELL CARCINOMA EXCISION   09/2014   "forehead"  . BREAST BIOPSY Left 07/16/2013  . BREAST LUMPECTOMY Left 04/08/2015   WITH RADIOACTIVE SEED LOCALIZATION   . BREAST LUMPECTOMY WITH RADIOACTIVE SEED LOCALIZATION Left 04/08/2015   Procedure: BREAST LUMPECTOMY WITH RADIOACTIVE SEED LOCALIZATION;  Surgeon: Rolm Bookbinder, MD;  Location: [facility];  Service: General;  Laterality: Left;  . CATARACT EXTRACTION W/ INTRAOCULAR LENS IMPLANT Right   . FRACTURE SURGERY    . SALPINGOOPHORECTOMY Right 1956  . SQUAMOUS CELL CARCINOMA EXCISION   09/2014   "forehead"  . TONSILLECTOMY AND ADENOIDECTOMY    . WRIST FRACTURE SURGERY Left July 2010   titanium     Current Outpatient Medications  Medication Sig Dispense Refill  . acetaminophen (TYLENOL) 325 MG tablet Take 325 mg by mouth daily as needed  (pain).    Marland Kitchen alendronate (FOSAMAX) 35 MG tablet Take 1 tablet (35 mg total) by mouth every 14 (fourteen) days. Take with a full glass of water on an empty stomach. 24 tablet 6  . anastrozole (ARIMIDEX) 1 MG tablet Take 1 tablet (1 mg total) by mouth daily. 90 tablet 0  . apixaban (ELIQUIS) 5 MG TABS tablet Take 1 tablet (5 mg total) by mouth 2 (two) times daily. 180 tablet 3  . Calcium-Magnesium-Vitamin D [id number]  MG-MG-UNIT TB24 Take 1,200 mEq by mouth.    . Cholecalciferol (VITAMIN D3) 2000 UNITS TABS Take 2,000 Units by mouth daily.    . Coenzyme Q10 (CO Q 10) 100 MG CAPS Take 200 mg by mouth daily.    Marland Kitchen CRANBERRY PO Take 1 tablet by mouth every other day.     . diltiazem (CARDIZEM) 30 MG tablet Take 1 tablet (30 mg total) by mouth daily. 90 tablet 3  . Multiple Vitamin (MULTIVITAMIN WITH MINERALS) TABS tablet Take 1 tablet by mouth daily.     No current facility-administered medications for this visit.     Allergies:   Sulfonamide derivatives; Other; Sulfacetamide; Amoxicillin; and Cipro hc [ciprofloxacin-hydrocortisone]    Social History:  The patient  reports that  has never smoked. she has never used smokeless tobacco. She reports that she does not drink alcohol or use drugs.   Family History:  The patient's family history includes Arrhythmia in her mother; Cancer in her mother and sister; Hyperlipidemia in her mother; Hypertension in her mother.    ROS:        Physical Exam: Blood pressure (!) 122/58, pulse 74, height 5\' 7"  (1.702 m), weight 164 lb 9.6 oz (74.7 kg), SpO2 96 %.  GEN:  Well nourished, well developed in no acute distress HEENT: Normal NECK: No JVD; No carotid bruits LYMPHATICS: No lymphadenopathy CARDIAC: RR  RESPIRATORY:  Clear to auscultation without rales, wheezing or rhonchi  ABDOMEN: Soft, non-tender, non-distended MUSCULOSKELETAL: Right knee is mildly edematous.  Appears to be some fluid below the kneecap.  It is slightly warm to the touch.  There is  no palpable cord.  There is no edema extending down into the leg.  This does not appear to be a DVT. SKIN: Warm and dry NEUROLOGIC:  Alert and oriented x 3   EKG:        Recent Labs: 10/13/2016: BUN 14; Creatinine, Ser 0.70; Hemoglobin 13.3; Platelets 265; Potassium 4.2; Sodium 142; TSH 3.290    Lipid Panel    Component Value Date/Time   CHOL 150 03/23/2016 1053   TRIG 229 (H) 03/23/2016 1053   HDL 30 (L) 03/23/2016 1053   CHOLHDL 5.0 03/23/2016 1053   VLDL 46 (H) 03/23/2016 1053   LDLCALC 74 03/23/2016 1053      Wt Readings from Last 3 Encounters:  07/30/17 164 lb 9.6 oz (74.7 kg)  07/17/17 162 lb 12.8 oz (73.8 kg)  01/15/17 164 lb 12.8 oz (74.8 kg)      Other studies Reviewed: Additional studies/ records that were reviewed today include: . Review of the above records demonstrates:    ASSESSMENT AND PLAN:  1. Leg swelling : Melayah presents with isolated right knee pain, warmth, and mild swelling.  I do not think that this is a DVT.  She has been taking her Eliquis on a regular and consistent basis.  We verify that she is taking the correct dose.  Appears that she has a knee strain.  She might have partially torn meniscus. Interestingly, she just finished a course of ciprofloxacin which is associated with tendon rupture.  It is possible that this might of predisposed her to a meniscus tear or similar orthopedic injury.  I do not think that the knee is consistent with a septic joint.  I recommended that she see her medical doctor.  She will give her medical doctor or me a call back if her knee worsens   2. Paroxysmal atrial fibrillation-she is maintaining normal sinus  rhythm.  Continue current meds.  3. Hx of Hypertension -her blood pressure is well controlled.  Current medicines are reviewed at length with the patient today.  The patient has concerns regarding medicines.  The following changes have been made:  no change  Labs/ tests ordered today include:   No  orders of the defined types were placed in this encounter.  Disposition:   FU with me in 6 months     Terri Moores, MD  07/30/2017 10:29 AM    [address], [place], [place]  [address] Phone: [phone]; Fax: [phone]

## 2017-08-09 ENCOUNTER — Ambulatory Visit: Payer: Self-pay | Admitting: Clinical

## 2017-08-09 ENCOUNTER — Other Ambulatory Visit: Payer: Self-pay | Admitting: Oncology

## 2017-08-11 ENCOUNTER — Other Ambulatory Visit: Payer: Self-pay | Admitting: Oncology

## 2017-08-20 ENCOUNTER — Telehealth: Payer: Self-pay | Admitting: Oncology

## 2017-08-20 ENCOUNTER — Inpatient Hospital Stay: Payer: Medicare PPO

## 2017-08-20 ENCOUNTER — Inpatient Hospital Stay: Payer: Medicare PPO | Attending: Oncology | Admitting: Oncology

## 2017-08-20 VITALS — BP 142/57 | HR 67 | Temp 98.3°F | Resp 18 | Ht 67.0 in | Wt 164.5 lb

## 2017-08-20 DIAGNOSIS — Z170 Estrogen receptor positive status [ER+]: Secondary | ICD-10-CM

## 2017-08-20 DIAGNOSIS — Z79811 Long term (current) use of aromatase inhibitors: Secondary | ICD-10-CM | POA: Diagnosis not present

## 2017-08-20 DIAGNOSIS — Z7901 Long term (current) use of anticoagulants: Secondary | ICD-10-CM | POA: Diagnosis not present

## 2017-08-20 DIAGNOSIS — M810 Age-related osteoporosis without current pathological fracture: Secondary | ICD-10-CM | POA: Insufficient documentation

## 2017-08-20 DIAGNOSIS — C50312 Malignant neoplasm of lower-inner quadrant of left female breast: Secondary | ICD-10-CM | POA: Insufficient documentation

## 2017-08-20 DIAGNOSIS — I10 Essential (primary) hypertension: Secondary | ICD-10-CM | POA: Diagnosis not present

## 2017-08-20 DIAGNOSIS — C50212 Malignant neoplasm of upper-inner quadrant of left female breast: Secondary | ICD-10-CM

## 2017-08-20 LAB — CBC WITH DIFFERENTIAL/PLATELET
Basophils Absolute: 0 10*3/uL (ref 0.0–0.1)
Basophils Relative: 0 %
EOS PCT: 3 %
Eosinophils Absolute: 0.2 10*3/uL (ref 0.0–0.5)
HCT: 40 % (ref 34.8–46.6)
HEMOGLOBIN: 13 g/dL (ref 11.6–15.9)
LYMPHS ABS: 2.2 10*3/uL (ref 0.9–3.3)
LYMPHS PCT: 25 %
MCH: 30.3 pg (ref 25.1–34.0)
MCHC: 32.5 g/dL (ref 31.5–36.0)
MCV: 93.2 fL (ref 79.5–101.0)
MONOS PCT: 8 %
Monocytes Absolute: 0.7 10*3/uL (ref 0.1–0.9)
NEUTROS PCT: 64 %
Neutro Abs: 5.6 10*3/uL (ref 1.5–6.5)
Platelets: 268 10*3/uL (ref 145–400)
RBC: 4.29 MIL/uL (ref 3.70–5.45)
RDW: 13.9 % (ref 11.2–14.5)
WBC: 8.8 10*3/uL (ref 3.9–10.3)

## 2017-08-20 LAB — COMPREHENSIVE METABOLIC PANEL
ALT: 14 U/L (ref 0–55)
AST: 16 U/L (ref 5–34)
Albumin: 3.9 g/dL (ref 3.5–5.0)
Alkaline Phosphatase: 75 U/L (ref 40–150)
Anion gap: 6 (ref 3–11)
BUN: 17 mg/dL (ref 7–26)
CHLORIDE: 105 mmol/L (ref 98–109)
CO2: 28 mmol/L (ref 22–29)
Calcium: 9.6 mg/dL (ref 8.4–10.4)
Creatinine, Ser: 0.79 mg/dL (ref 0.60–1.10)
GFR calc Af Amer: >60 mL/min (ref >60)
Glucose, Bld: 99 mg/dL (ref 70–140)
POTASSIUM: 4.1 mmol/L (ref 3.5–5.1)
SODIUM: 139 mmol/L (ref 136–145)
Total Bilirubin: 0.5 mg/dL (ref 0.2–1.2)
Total Protein: 6.3 g/dL — ABNORMAL LOW (ref 6.4–8.3)

## 2017-08-20 MED ORDER — ANASTROZOLE 1 MG PO TABS: 1.0000 mg | ORAL_TABLET | Freq: Every day | ORAL | 4 refills | Status: DC

## 2017-08-20 MED ORDER — ALENDRONATE SODIUM 35 MG PO TABS: 35.0000 mg | ORAL_TABLET | ORAL | 6 refills | Status: DC

## 2017-08-20 NOTE — Telephone Encounter (Signed)
Gave patient AVs and calendar of upcoming April 2020 appointments.  °

## 2017-08-20 NOTE — Progress Notes (Signed)
[facility]  [phone] [phone]     ID: Terri Ponce OB: 06/04/1928  MR#: [id number]  GUY#:[id number]  PCP: Terri Ponce, Terri Ponce GYN:   SU: Terri Ponce, Terri Ponce OTHER Terri Ponce: Terri Ponce  CHIEF COMPLAINT: Locally advanced left breast cancer  CURRENT TREATMENT: Anastrozole, alendronate   BREAST CANCER HISTORY: From the original intake Notes 08/13/2013:   Terri Ponce had screening mammography December of 2012 showing a low density oval nodule at the 9:00 position which was not palpable and which by ultrasound appeared to be a cyst. 6 month reevaluation along the 11/29/2011 showed a small nodule to have entirely resolved. Return to screening mammography was recommended.  However in January of 2015 the patient noted a change in her left breast which he initially felt was due to refer her bra rubbing on her skin. She was set up for bilateral diagnostic mammography and left breast ultrasonography at the breast center 06/20/2013. There was an asymmetry in the left breast area of palpable concern in exam showed a firm raised red area of spanning 3 cm along the far medial left breast inframammary fold. Ultrasound confirmed an area of skin thickening in ill-defined change measuring 1.7 cm. This extended to the skin. It was felt to be possibly due to an infected sebaceous cyst, and the patient was given antibiotics for a week  On 07/04/2013 repeat ultrasound of the left breast showed an irregular shadowing mass at the 8:00 position measuring 1.6 cm. The left axilla showed no lymphadenopathy. Physical exam showed a firm mass in this area. Biopsy was recommended and the patient's Rivaroxaban was temporarily stopped. On 07/16/2013, biopsy of the left breast 8:00 area in question showed ([id number]) and invasive lobular carcinoma (E-cadherin negative), grade 1, estrogen receptor 100% positive, progesterone receptor 74% positive, with an MIB-1 of 16% and no  HER-2 amplification, the signals ratio being 1.29 and the number per cell 2.25.  On 07/25/2013 the patient underwent bilateral breast MRI. This showed in the lower inner quadrant of the left breast in the inframammary fold a 3.2 cm irregular enhancing mass containing a biopsy clip. Some spicules of enhancement extended posteriorly to the liver of the pectoralis. The mass abuts and appears to involve overlying skin. In addition, a 9 mm separate area of nodular enhancement was noted anterior and medial to the biopsied area. There were 2 left internal mammary lymph nodes measuring 4 mm and 2 mm. There was also a morphologically abnormal 1 cm lymph node in the left axilla. Incidental finding in the liver noted multiple masses favored to represent cysts.  On 08/04/2013 the patient underwent biopsy of the suspicious left axillary lymph node and this showed ([id number]) no evidence of malignancy. This was felt to be "possibly not concordant". Repeat MRI in 6 months (assuming neoadjuvant treatment) was recommended.  The patient has met with Dr. Dwain Ponce and Dr. Michell Ponce. Dr. Dwain Ponce have set the patient up for biopsy of the satellite lesion in the left breast. She feels the patient would benefit from neoadjuvant endocrine therapy. Dr. Michell Ponce feels the patient will need radiation, even if she eventually ends up having a mastectomy.  The patient's subsequent history is as detailed below.  INTERVAL HISTORY: "Terri Ponce" returns today for followup of her left-sided positive breast cancer. She continues on anastrozole, with good tolerance. She takes this every other day, instead of every day because it keeps her alert at night. However, she notes that she was taking this medication at  bedtime, so she will try to take it in the morning to see if this helps with insomnia. She denies issues with hot flashes or vaginal dryness.    She also receives alendronate, with good tolerance. She started taking this once per  week.  Since her last visit, she underwent diagnostic bilateral mammography with CAD and tomography on [facility] showing: breast density category B. There was no evidence of malignancy in either breast.    REVIEW OF SYSTEMS: Terri Ponce reports that she is doing well. She has been making an effort to increase social activities and building friendships with her neighbors. She also joined [employer], and she is becoming an active member there. She is still grieving the loss of her husband 3 years ago, and she says she will always miss him. For exercise, [facility] [employer] [facility] and [facility] on [facility]. She also belongs with a swim group. She denies unusual headaches, visual changes, nausea, vomiting, or dizziness. There has been no unusual cough, phlegm production, or pleurisy. This been no change in bowel or bladder habits. She denies unexplained fatigue or unexplained weight loss, bleeding, rash, or fever. A detailed review of systems was otherwise stable.    PAST MEDICAL HISTORY: Past Medical History:  Diagnosis Date  . A-fib ([facility])   . Anemia   . Anxiety   . Arthritis    "stiffness and soreness in my joints; particularly knee joints and fingers" (04/08/2015)  . Breast cancer ([facility])   . Cancer of left breast ([facility]) dx'd 06/2013  . Depression    still grieving husband's death from 03-09-2014 . Hepatitis A   . History of blood transfusion 1956   "related to the SALPINGOOPHORECTOMY"  . Hypertension   . Multiple thyroid nodules    "very small; dx'd w/carotid doppler study"  . Personal history of radiation therapy   . PONV (postoperative nausea and vomiting) 1956   none since  . Scintillating scotoma of right eye dx'd 02/2015  . Shortness of breath dyspnea    with exertion or excitement  . Walking pneumonia X 1   "self dx'd"   possible history of TIAs  PAST SURGICAL HISTORY: Past Surgical History:  Procedure  Laterality Date  . APPENDECTOMY  1956  . BASAL CELL CARCINOMA EXCISION   09/2014   "forehead"  . BREAST BIOPSY Left 07/16/2013  . BREAST LUMPECTOMY Left 04/08/2015   WITH RADIOACTIVE SEED LOCALIZATION   . BREAST LUMPECTOMY WITH RADIOACTIVE SEED LOCALIZATION Left 04/08/2015   Procedure: BREAST LUMPECTOMY WITH RADIOACTIVE SEED LOCALIZATION;  Surgeon: Terri Ponce, Terri Ponce;  Location: [facility] OR;  Service: General;  Laterality: Left;  . CATARACT EXTRACTION W/ INTRAOCULAR LENS IMPLANT Right   . FRACTURE SURGERY    . SALPINGOOPHORECTOMY Right 1956  . SQUAMOUS CELL CARCINOMA EXCISION   09/2014   "forehead"  . TONSILLECTOMY AND ADENOIDECTOMY    . WRIST FRACTURE SURGERY Left July 2010   titanium    FAMILY HISTORY Family History  Problem Relation Age of Onset  . Arrhythmia Mother        A-Fib  . Hyperlipidemia Mother   . Hypertension Mother   . Cancer Mother        breast cancer  . Cancer Sister    the patient's father died at the age of 80, from complications of asbestosis. The patient's mother died at the age of 51. She had been diagnosed with breast cancer a  few years before. The patient had no brothers, one sister. His sister died at age 59 from cancer which was metastatic but the patient does not know the primary  GYNECOLOGIC HISTORY:  Menarche age 38, first live birth age 27, the patient is GX P2. She went through menopause at around age 4 or 13. She did not take hormone replacement. She took birth control pills for some years without any clotting complications  SOCIAL HISTORY:  Terri Ponce has a Masters degree in public health, specifically nutrition. Her husband Weston Brass was an Art gallery manager and he worked for [employer] division. Daughter Luretha Peterkin (goes by "Byrd Hesselbach") is a Gaffer with the [employer].  Son lives in [place] worry works as an Contractor. The patient has 2 grandchildren. She is a [employer]    ADVANCED DIRECTIVES:  in place; the patient's daughter is  her healthcare power of attorney   HEALTH MAINTENANCE: Social History   Tobacco Use  . Smoking status: Never Smoker  . Smokeless tobacco: Never Used  Substance Use Topics  . Alcohol use: No  . Drug use: No     Colonoscopy: Remote  PAP: remote  Bone density: 2014; "good"   Lipid panel:  Allergies  Allergen Reactions  . Sulfonamide Derivatives Other (See Comments)    Extreme anxiety  . Other Rash    Shingles vaccine  . Sulfacetamide   . Amoxicillin Other (See Comments)    abdominal pain  . Cipro Hc [Ciprofloxacin-Hydrocortisone] Swelling    Current Outpatient Medications  Medication Sig Dispense Refill  . acetaminophen (TYLENOL) 325 MG tablet Take 325 mg by mouth daily as needed (pain).    Marland Kitchen alendronate (FOSAMAX) 35 MG tablet Take 1 tablet (35 mg total) by mouth every 14 (fourteen) days. Take with a full glass of water on an empty stomach. 24 tablet 6  . anastrozole (ARIMIDEX) 1 MG tablet Take 1 tablet (1 mg total) by mouth at bedtime. 90 tablet 4  . apixaban (ELIQUIS) 5 MG TABS tablet Take 1 tablet (5 mg total) by mouth 2 (two) times daily. 180 tablet 3  . Calcium-Magnesium-Vitamin D 600-40-500 MG-MG-UNIT TB24 Take 1,200 mEq by mouth.    . Cholecalciferol (VITAMIN D3) 2000 UNITS TABS Take 2,000 Units by mouth daily.    . Coenzyme Q10 (CO Q 10) 100 MG CAPS Take 200 mg by mouth daily.    Marland Kitchen CRANBERRY PO Take 1 tablet by mouth every other day.     . diltiazem (CARDIZEM) 30 MG tablet Take 1 tablet (30 mg total) by mouth daily. 90 tablet 3  . Multiple Vitamin (MULTIVITAMIN WITH MINERALS) TABS tablet Take 1 tablet by mouth daily.     No current facility-administered medications for this visit.     OBSERVATION: Elderly white woman who appears stated age Vitals:   08/20/17 0914  BP: (!) 142/57  Pulse: 67  Resp: 18  Temp: 98.3 F (36.8 C)  SpO2: 96%     Body mass index is 25.76 kg/m.    ECOG FS:1 - Symptomatic but completely ambulatory  Sclerae unicteric, EOMs  intact No cervical or supraclavicular adenopathy Lungs no rales or rhonchi Heart regular rate and rhythm Abd soft, nontender, positive bowel sounds MSK no focal spinal tenderness, no upper extremity lymphedema Neuro: nonfocal, well oriented, appropriate affect Breasts: The right breast is unremarkable.  On the left she is status post lumpectomy and radiation.  There is no evidence of local recurrence.  Both axillae are benign.  LAB RESULTS:  CMP     Component Value Date/Time   NA 139 08/20/2017 0858   NA 142 10/13/2016 0852   NA 140 03/30/2016 1500    I No results found for: SPEP  Lab Results  Component Value Date   WBC 8.8 08/20/2017      Chemistry      Component Value Date/Time   NA 139 08/20/2017 0858   NA 142 10/13/2016 0852   NA 140 03/30/2016 1500      Component Value Date/Time   CALCIUM 9.6 08/20/2017 0858   CALCIUM 9.7 03/30/2016 1500       No results found for: LABCA2  No components found for: [id number]  No results for input(s): INR in the last 168 hours.  Urinalysis No results found for: COLORURINE  STUDIES: Since her last visit, she underwent diagnostic bilateral mammography with CAD and tomography on 03/05/2017 [facility] showing: breast density category B. There was no evidence of malignancy in either breast.    ASSESSMENT: 84 y.o. [place] woman status post left breast biopsy 07/16/2013 for a clinically multifocal T2 N0-3 (stage IIA-IIIC) invasive lobular breast cancer, estrogen receptor 100% positive, progesterone receptor 74% positive, with an MIB-1 of 16% and no HER-2 amplification  (1) biopsy of a suspicious left axillary lymph node 08/04/2013 was negative, but possibly discordant  (2) the patient has 2 suspicious left internal mammary lymph nodes as well as some indeterminate liver lesions which were negative on PET scan 08/26/2013 and by repeat breast MRI are c/w cysts  (3) MRI biopsy of a satellite lesion in the left breast  08/15/2013 showed atypical lobular hyperplasia.  (4) anastrozole started 08/13/2013  (a) breast MRI 01/20/2014 shows near complete resolution of the measurable mass  (b) breast MRI 01/27/2015 shows evidence of progression  (5) DEXA scan 03/05/2013 at [facility] physicians showed osteoporosis with the lowest T score at -2.7  (6) left lumpectomy 04/08/2015 showed a pT1c pN0, stage IA invasive ductal carcinoma, grade 1, repeat HER-2 again negative.  (7) adjuvant radiation 05/27/2015-06/24/2015:  Left breast/ 42.72 Gy at 2.67 Gy per fraction x 16 fractions.  Left breast boost/ 10 Gy at 2 Gy per fraction x 5 fractions  (8) thyroid nodule s/p biopsy 06/01/2015 showing mild atypia ([id number])  (9 osteoporosis, with a T score of -2.7 on bone density scan 03/05/2013  (a) ibandronate started 07/21/2015; discontinued November 2017  (b) alendronate 35 mg twice a month started 04/05/2016  (c) DEXA scan at [facility] 05/10/2015 showed a T score of -2.9.   PLAN: Peniel Donath is now 2-1/2 years out from definitive surgery for her breast cancer with no evidence of disease recurrence.  This is favorable.  She has been on anastrozole for years.  The plan is to continue 1 more year after which point she will "graduate".  She understands Dr. Pete Glatter is interested in obtaining a thyroid biopsy.  I encouraged her to go along with this.  She tells me she will give it more thought  I commended her for her excellent exercise program and for her continuing to make social contacts at church in her neighborhood and in other areas.  She is still grieving for her husband appropriately.  She will see me one last time a year from now.  She knows to call for any other issues that may develop before her next visit.    Terri Ponce, Terri Hue, Terri Ponce  08/20/17 9:56 AM Medical Oncology and Hematology [facility] [address] [place],  [place] [address] Tel. [phone]    Fax. [phone]  This document serves as a  record of services personally performed by Ruthann Cancer, Terri Ponce. It was created on his behalf by Merideth Abbey, a trained medical scribe. The creation of this record is based on the scribe's personal observations and the provider's statements to them.   I have reviewed the above documentation for accuracy and completeness, and I agree with the above.

## 2017-08-24 DIAGNOSIS — Z85828 Personal history of other malignant neoplasm of skin: Secondary | ICD-10-CM | POA: Diagnosis not present

## 2017-08-24 DIAGNOSIS — L821 Other seborrheic keratosis: Secondary | ICD-10-CM | POA: Diagnosis not present

## 2017-08-24 DIAGNOSIS — L570 Actinic keratosis: Secondary | ICD-10-CM | POA: Diagnosis not present

## 2017-08-24 DIAGNOSIS — D1801 Hemangioma of skin and subcutaneous tissue: Secondary | ICD-10-CM | POA: Diagnosis not present

## 2017-08-24 DIAGNOSIS — D2272 Melanocytic nevi of left lower limb, including hip: Secondary | ICD-10-CM | POA: Diagnosis not present

## 2017-08-24 DIAGNOSIS — L814 Other melanin hyperpigmentation: Secondary | ICD-10-CM | POA: Diagnosis not present

## 2017-08-24 DIAGNOSIS — D225 Melanocytic nevi of trunk: Secondary | ICD-10-CM | POA: Diagnosis not present

## 2017-08-24 DIAGNOSIS — D2261 Melanocytic nevi of right upper limb, including shoulder: Secondary | ICD-10-CM | POA: Diagnosis not present

## 2017-08-24 DIAGNOSIS — D2262 Melanocytic nevi of left upper limb, including shoulder: Secondary | ICD-10-CM | POA: Diagnosis not present

## 2017-09-14 DIAGNOSIS — H906 Mixed conductive and sensorineural hearing loss, bilateral: Secondary | ICD-10-CM | POA: Diagnosis not present

## 2017-09-19 IMAGING — NM NM MISC PROCEDURE
16 series · 60 of 60 positions shown · non-contrast
Comparison: none

[Series 1: wbr_r-proj_st rest-mc · 6.40mm/px · 4 of 64 frames shown (1 of 2)]
[frame 6/64]
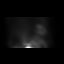
[frame 27/64]
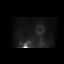
[frame 38/64]
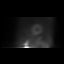
[frame 59/64]
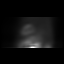

[Series 1: rest · 6.40mm/px · 4 of 64 frames shown (1 of 2)]
[frame 6/64]
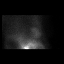
[frame 27/64]
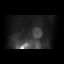
[frame 48/64]
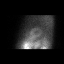
[frame 59/64]
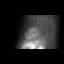

[Series 1: stress-sum-em-mc · 6.40mm/px · 3 of 64 frames shown]
[frame 16/64]
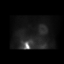
[frame 27/64]
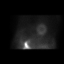
[frame 48/64]
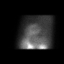

[Series 1: rest-mc · 6.40mm/px · 4 of 64 frames shown (1 of 2)]
[frame 6/64]
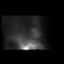
[frame 16/64]
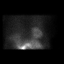
[frame 38/64]
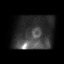
[frame 59/64]
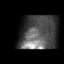

[Series 1: rest-mc · 6.40mm/px · 4 of 64 frames shown (2 of 2)]
[frame 6/64]
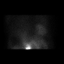
[frame 27/64]
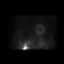
[frame 38/64]
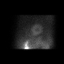
[frame 59/64]
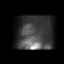

[Series 1: stress-gsp-mc · 6.40mm/px · 4 of 512 frames shown]
[frame 128/512]
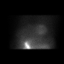
[frame 214/512]
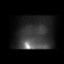
[frame 384/512]
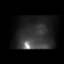
[frame 470/512]
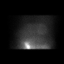

[Series 1: stress-gsp · 6.40mm/px · 3 of 512 frames shown]
[frame 128/512]
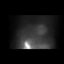
[frame 299/512]
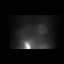
[frame 384/512]
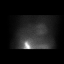

[Series 1: wbr_r-proj_st rest-mc · 6.40mm/px · 4 of 64 frames shown (2 of 2)]
[frame 6/64]
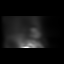
[frame 16/64]
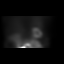
[frame 38/64]
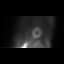
[frame 59/64]
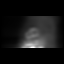

[Series 1: rest · 6.40mm/px · 4 of 64 frames shown (2 of 2)]
[frame 6/64]
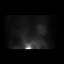
[frame 27/64]
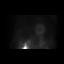
[frame 48/64]
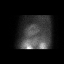
[frame 59/64]
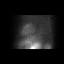

[Series 1: wbr_r-proj_st rest · 6.40mm/px · 3 of 64 frames shown (1 of 2)]
[frame 16/64]
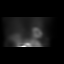
[frame 27/64]
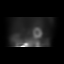
[frame 48/64]
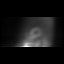

[Series 1: wbr_s-proj_st stress-gsp-mc · 6.40mm/px · 4 of 512 frames shown]
[frame 43/512]
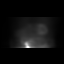
[frame 128/512]
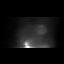
[frame 299/512]
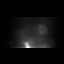
[frame 384/512]
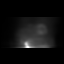

[Series 1: wbr_s-proj_st stress-gsp · 6.40mm/px · 4 of 512 frames shown]
[frame 43/512]
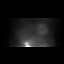
[frame 214/512]
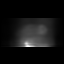
[frame 299/512]
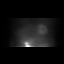
[frame 470/512]
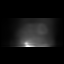

[Series 1: wbr_r-proj_st rest · 6.40mm/px · 4 of 64 frames shown (2 of 2)]
[frame 6/64]
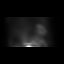
[frame 27/64]
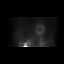
[frame 48/64]
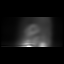
[frame 59/64]
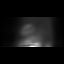

[Series 1: wbr_s-proj_st stress-sum-em · 6.40mm/px · 3 of 64 frames shown]
[frame 16/64]
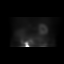
[frame 38/64]
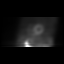
[frame 48/64]
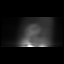

[Series 1: wbr_s-proj_st stress-sum-em-mc · 6.40mm/px · 4 of 64 frames shown]
[frame 6/64]
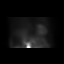
[frame 16/64]
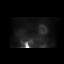
[frame 38/64]
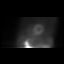
[frame 59/64]
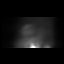

[Series 1: stress-sum-em · 6.40mm/px · 4 of 64 frames shown]
[frame 6/64]
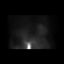
[frame 27/64]
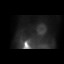
[frame 38/64]
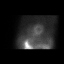
[frame 59/64]
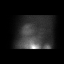

[60 of 60 positions shown; findings below may reference images not displayed]

Canned report from images found in remote index.

Refer to host system for actual result text.

## 2017-09-21 DIAGNOSIS — H401132 Primary open-angle glaucoma, bilateral, moderate stage: Secondary | ICD-10-CM | POA: Diagnosis not present

## 2017-10-02 DIAGNOSIS — H7313 Chronic myringitis, bilateral: Secondary | ICD-10-CM | POA: Diagnosis not present

## 2017-10-02 DIAGNOSIS — H906 Mixed conductive and sensorineural hearing loss, bilateral: Secondary | ICD-10-CM | POA: Diagnosis not present

## 2017-10-06 ENCOUNTER — Emergency Department (HOSPITAL_COMMUNITY): Payer: Medicare PPO

## 2017-10-06 ENCOUNTER — Encounter (HOSPITAL_COMMUNITY): Payer: Self-pay | Admitting: Emergency Medicine

## 2017-10-06 ENCOUNTER — Inpatient Hospital Stay (HOSPITAL_COMMUNITY)
Admission: EM | Admit: 2017-10-06 | Discharge: 2017-10-11 | DRG: 563 | Disposition: A | Discharge status: Skilled Nursing Facility | Payer: Medicare PPO | Attending: General Surgery | Admitting: General Surgery

## 2017-10-06 ENCOUNTER — Other Ambulatory Visit: Payer: Self-pay

## 2017-10-06 DIAGNOSIS — Z853 Personal history of malignant neoplasm of breast: Secondary | ICD-10-CM | POA: Diagnosis not present

## 2017-10-06 DIAGNOSIS — H9190 Unspecified hearing loss, unspecified ear: Secondary | ICD-10-CM | POA: Diagnosis not present

## 2017-10-06 DIAGNOSIS — S0990XA Unspecified injury of head, initial encounter: Secondary | ICD-10-CM | POA: Diagnosis not present

## 2017-10-06 DIAGNOSIS — Z923 Personal history of irradiation: Secondary | ICD-10-CM

## 2017-10-06 DIAGNOSIS — Z8619 Personal history of other infectious and parasitic diseases: Secondary | ICD-10-CM

## 2017-10-06 DIAGNOSIS — Z880 Allergy status to penicillin: Secondary | ICD-10-CM | POA: Diagnosis not present

## 2017-10-06 DIAGNOSIS — Z743 Need for continuous supervision: Secondary | ICD-10-CM | POA: Diagnosis not present

## 2017-10-06 DIAGNOSIS — R079 Chest pain, unspecified: Secondary | ICD-10-CM | POA: Diagnosis not present

## 2017-10-06 DIAGNOSIS — Y92410 Unspecified street and highway as the place of occurrence of the external cause: Secondary | ICD-10-CM

## 2017-10-06 DIAGNOSIS — S42032D Displaced fracture of lateral end of left clavicle, subsequent encounter for fracture with routine healing: Secondary | ICD-10-CM | POA: Diagnosis not present

## 2017-10-06 DIAGNOSIS — Z7983 Long term (current) use of bisphosphonates: Secondary | ICD-10-CM

## 2017-10-06 DIAGNOSIS — R279 Unspecified lack of coordination: Secondary | ICD-10-CM | POA: Diagnosis not present

## 2017-10-06 DIAGNOSIS — S22038A Other fracture of third thoracic vertebra, initial encounter for closed fracture: Secondary | ICD-10-CM | POA: Diagnosis not present

## 2017-10-06 DIAGNOSIS — Z79899 Other long term (current) drug therapy: Secondary | ICD-10-CM

## 2017-10-06 DIAGNOSIS — M6281 Muscle weakness (generalized): Secondary | ICD-10-CM | POA: Diagnosis not present

## 2017-10-06 DIAGNOSIS — S42022A Displaced fracture of shaft of left clavicle, initial encounter for closed fracture: Secondary | ICD-10-CM | POA: Diagnosis not present

## 2017-10-06 DIAGNOSIS — Z85828 Personal history of other malignant neoplasm of skin: Secondary | ICD-10-CM | POA: Diagnosis not present

## 2017-10-06 DIAGNOSIS — S3991XA Unspecified injury of abdomen, initial encounter: Secondary | ICD-10-CM | POA: Diagnosis not present

## 2017-10-06 DIAGNOSIS — S22039A Unspecified fracture of third thoracic vertebra, initial encounter for closed fracture: Secondary | ICD-10-CM | POA: Diagnosis present

## 2017-10-06 DIAGNOSIS — I10 Essential (primary) hypertension: Secondary | ICD-10-CM | POA: Diagnosis present

## 2017-10-06 DIAGNOSIS — S80211A Abrasion, right knee, initial encounter: Secondary | ICD-10-CM | POA: Diagnosis not present

## 2017-10-06 DIAGNOSIS — Z881 Allergy status to other antibiotic agents status: Secondary | ICD-10-CM | POA: Diagnosis not present

## 2017-10-06 DIAGNOSIS — S22000A Wedge compression fracture of unspecified thoracic vertebra, initial encounter for closed fracture: Secondary | ICD-10-CM

## 2017-10-06 DIAGNOSIS — S199XXA Unspecified injury of neck, initial encounter: Secondary | ICD-10-CM | POA: Diagnosis not present

## 2017-10-06 DIAGNOSIS — Z887 Allergy status to serum and vaccine status: Secondary | ICD-10-CM | POA: Diagnosis not present

## 2017-10-06 DIAGNOSIS — M1990 Unspecified osteoarthritis, unspecified site: Secondary | ICD-10-CM | POA: Diagnosis not present

## 2017-10-06 DIAGNOSIS — K219 Gastro-esophageal reflux disease without esophagitis: Secondary | ICD-10-CM | POA: Diagnosis not present

## 2017-10-06 DIAGNOSIS — M810 Age-related osteoporosis without current pathological fracture: Secondary | ICD-10-CM | POA: Diagnosis not present

## 2017-10-06 DIAGNOSIS — S32029A Unspecified fracture of second lumbar vertebra, initial encounter for closed fracture: Secondary | ICD-10-CM | POA: Diagnosis not present

## 2017-10-06 DIAGNOSIS — Z882 Allergy status to sulfonamides status: Secondary | ICD-10-CM | POA: Diagnosis not present

## 2017-10-06 DIAGNOSIS — S42025A Nondisplaced fracture of shaft of left clavicle, initial encounter for closed fracture: Secondary | ICD-10-CM | POA: Diagnosis not present

## 2017-10-06 DIAGNOSIS — I480 Paroxysmal atrial fibrillation: Secondary | ICD-10-CM | POA: Diagnosis not present

## 2017-10-06 DIAGNOSIS — S299XXA Unspecified injury of thorax, initial encounter: Secondary | ICD-10-CM | POA: Diagnosis not present

## 2017-10-06 DIAGNOSIS — R109 Unspecified abdominal pain: Secondary | ICD-10-CM | POA: Diagnosis not present

## 2017-10-06 DIAGNOSIS — S32020G Wedge compression fracture of second lumbar vertebra, subsequent encounter for fracture with delayed healing: Secondary | ICD-10-CM | POA: Diagnosis not present

## 2017-10-06 DIAGNOSIS — S2221XA Fracture of manubrium, initial encounter for closed fracture: Secondary | ICD-10-CM

## 2017-10-06 DIAGNOSIS — C50919 Malignant neoplasm of unspecified site of unspecified female breast: Secondary | ICD-10-CM | POA: Diagnosis not present

## 2017-10-06 DIAGNOSIS — S42009A Fracture of unspecified part of unspecified clavicle, initial encounter for closed fracture: Secondary | ICD-10-CM | POA: Diagnosis not present

## 2017-10-06 DIAGNOSIS — C50212 Malignant neoplasm of upper-inner quadrant of left female breast: Secondary | ICD-10-CM | POA: Diagnosis not present

## 2017-10-06 DIAGNOSIS — E042 Nontoxic multinodular goiter: Secondary | ICD-10-CM | POA: Diagnosis not present

## 2017-10-06 DIAGNOSIS — S32030K Wedge compression fracture of third lumbar vertebra, subsequent encounter for fracture with nonunion: Secondary | ICD-10-CM | POA: Diagnosis not present

## 2017-10-06 DIAGNOSIS — M542 Cervicalgia: Secondary | ICD-10-CM | POA: Diagnosis not present

## 2017-10-06 DIAGNOSIS — S42032A Displaced fracture of lateral end of left clavicle, initial encounter for closed fracture: Secondary | ICD-10-CM | POA: Diagnosis present

## 2017-10-06 DIAGNOSIS — S42035A Nondisplaced fracture of lateral end of left clavicle, initial encounter for closed fracture: Principal | ICD-10-CM

## 2017-10-06 DIAGNOSIS — S42002A Fracture of unspecified part of left clavicle, initial encounter for closed fracture: Secondary | ICD-10-CM | POA: Diagnosis not present

## 2017-10-06 DIAGNOSIS — I4891 Unspecified atrial fibrillation: Secondary | ICD-10-CM | POA: Diagnosis not present

## 2017-10-06 DIAGNOSIS — R2689 Other abnormalities of gait and mobility: Secondary | ICD-10-CM | POA: Diagnosis not present

## 2017-10-06 DIAGNOSIS — R278 Other lack of coordination: Secondary | ICD-10-CM | POA: Diagnosis not present

## 2017-10-06 LAB — BASIC METABOLIC PANEL
Anion gap: 9 (ref 5–15)
BUN: 16 mg/dL (ref 6–20)
CO2: 22 mmol/L (ref 22–32)
CREATININE: 0.86 mg/dL (ref 0.44–1.00)
Calcium: 9.2 mg/dL (ref 8.9–10.3)
Chloride: 106 mmol/L (ref 101–111)
GFR calc Af Amer: >60 mL/min (ref >60)
GFR calc non Af Amer: 59 mL/min — ABNORMAL LOW (ref >60)
GLUCOSE: 124 mg/dL — AB (ref 65–99)
Potassium: 3.8 mmol/L (ref 3.5–5.1)
SODIUM: 137 mmol/L (ref 135–145)

## 2017-10-06 LAB — I-STAT TROPONIN, ED
TROPONIN I, POC: 0.01 ng/mL (ref 0.00–0.08)
Troponin i, poc: 0 ng/mL (ref 0.00–0.08)

## 2017-10-06 LAB — CBC
HCT: 43.6 % (ref 36.0–46.0)
Hemoglobin: 14 g/dL (ref 12.0–15.0)
MCH: 29.3 pg (ref 26.0–34.0)
MCHC: 32.1 g/dL (ref 30.0–36.0)
MCV: 91.2 fL (ref 78.0–100.0)
PLATELETS: 372 10*3/uL (ref 150–400)
RBC: 4.78 MIL/uL (ref 3.87–5.11)
RDW: 13.8 % (ref 11.5–15.5)
WBC: 12 10*3/uL — AB (ref 4.0–10.5)

## 2017-10-06 MED ORDER — TRAMADOL HCL 50 MG PO TABS
50.0000 mg | ORAL_TABLET | Freq: Four times a day (QID) | ORAL | Status: DC | PRN
  Administered 2017-10-06: 50 mg via ORAL
  Filled 2017-10-06: qty 1

## 2017-10-06 MED ORDER — SODIUM CHLORIDE 0.9 % IV SOLN: 250.0000 mL | INTRAVENOUS | Status: DC | PRN

## 2017-10-06 MED ORDER — ONDANSETRON 4 MG PO TBDP
4.0000 mg | ORAL_TABLET | Freq: Four times a day (QID) | ORAL | Status: DC | PRN
  Filled 2017-10-06: qty 1

## 2017-10-06 MED ORDER — SODIUM CHLORIDE 0.9% FLUSH: 3.0000 mL | INTRAVENOUS | Status: DC | PRN

## 2017-10-06 MED ORDER — ACETAMINOPHEN 500 MG PO TABS
1000.0000 mg | ORAL_TABLET | Freq: Once | ORAL | Status: AC
  Administered 2017-10-06: 1000 mg via ORAL
  Filled 2017-10-06: qty 2

## 2017-10-06 MED ORDER — ONDANSETRON HCL 4 MG/2ML IJ SOLN
4.0000 mg | Freq: Four times a day (QID) | INTRAMUSCULAR | Status: DC | PRN
  Administered 2017-10-08: 4 mg via INTRAVENOUS
  Filled 2017-10-06: qty 2

## 2017-10-06 MED ORDER — IOHEXOL 300 MG/ML  SOLN
100.0000 mL | Freq: Once | INTRAMUSCULAR | Status: AC | PRN
Start: 2017-10-06 — End: 2017-10-06
  Administered 2017-10-06: 100 mL via INTRAVENOUS

## 2017-10-06 MED ORDER — SODIUM CHLORIDE 0.9% FLUSH
3.0000 mL | Freq: Two times a day (BID) | INTRAVENOUS | Status: DC
  Administered 2017-10-06 – 2017-10-11 (×10): 3 mL via INTRAVENOUS

## 2017-10-06 MED ORDER — MORPHINE SULFATE (PF) 2 MG/ML IV SOLN: 2.0000 mg | INTRAVENOUS | Status: DC | PRN

## 2017-10-06 NOTE — Progress Notes (Signed)
Orthopedic Tech Progress Note Patient Details:  Terri Ponce 02/28/1929 [id number]  Ortho Devices Type of Ortho Device: Sling immobilizer Ortho Device/Splint Location: lue Ortho Device/Splint Interventions: Ordered, Application, Adjustment   Post Interventions Patient Tolerated: Well Instructions Provided: Care of device, Adjustment of device   Karolee Stamps 10/06/2017, 10:42 PM
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
## 2017-10-06 NOTE — ED Notes (Signed)
Attempted to call report
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
## 2017-10-06 NOTE — ED Notes (Signed)
Patient also presents with her dog, who was in the car with her, in a carrier.

## 2017-10-06 NOTE — ED Notes (Signed)
Patient transported to CT 
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
## 2017-10-06 NOTE — ED Notes (Signed)
Upon further assessment, patient confused, having difficulty recalling her birthday and other questions.  Patient diaphoretic.  Difficulty raising left arm due to pain, unable to assess for drift, but grips are equal.  A-fib on EKG

## 2017-10-06 NOTE — ED Notes (Signed)
Attempted to call patient's son and daughter, per patient request.  Left message, no answer

## 2017-10-06 NOTE — ED Notes (Signed)
[employer] at bedside 

## 2017-10-06 NOTE — Progress Notes (Signed)
Patient ID: Terri Ponce, female   DOB: 09/08/1928, 84 y.o.   MRN: [id number] Reviewed films and mild T3 compression and lumbar transverse process fractures do not need bracing or intervention except to ensure adequate supplements and workup/treatment for osteoporosis.  Recommend stopping eliquis if possible for a few days to minimize the risk of delayed hematoma
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
## 2017-10-07 LAB — CBC
HEMATOCRIT: 39.8 % (ref 36.0–46.0)
Hemoglobin: 12.7 g/dL (ref 12.0–15.0)
MCH: 29.3 pg (ref 26.0–34.0)
MCHC: 31.9 g/dL (ref 30.0–36.0)
MCV: 91.9 fL (ref 78.0–100.0)
PLATELETS: 301 10*3/uL (ref 150–400)
RBC: 4.33 MIL/uL (ref 3.87–5.11)
RDW: 13.9 % (ref 11.5–15.5)
WBC: 12.6 10*3/uL — AB (ref 4.0–10.5)

## 2017-10-07 LAB — BASIC METABOLIC PANEL
ANION GAP: 8 (ref 5–15)
BUN: 18 mg/dL (ref 6–20)
CALCIUM: 8.9 mg/dL (ref 8.9–10.3)
CO2: 25 mmol/L (ref 22–32)
Chloride: 102 mmol/L (ref 101–111)
Creatinine, Ser: 0.81 mg/dL (ref 0.44–1.00)
GFR calc Af Amer: >60 mL/min (ref >60)
GFR calc non Af Amer: >60 mL/min (ref >60)
Glucose, Bld: 135 mg/dL — ABNORMAL HIGH (ref 65–99)
Potassium: 4.2 mmol/L (ref 3.5–5.1)
Sodium: 135 mmol/L (ref 135–145)

## 2017-10-07 MED ORDER — LATANOPROST 0.005 % OP SOLN
1.0000 [drp] | Freq: Every day | OPHTHALMIC | Status: DC
  Administered 2017-10-07 – 2017-10-10 (×4): 1 [drp] via OPHTHALMIC
  Filled 2017-10-07 (×2): qty 2.5

## 2017-10-07 MED ORDER — BACITRACIN ZINC 500 UNIT/GM EX OINT
TOPICAL_OINTMENT | Freq: Every day | CUTANEOUS | Status: DC
  Administered 2017-10-07 – 2017-10-08 (×2): via TOPICAL
  Administered 2017-10-09: 1 via TOPICAL
  Administered 2017-10-10 – 2017-10-11 (×2): via TOPICAL
  Filled 2017-10-07 (×2): qty 28.4

## 2017-10-07 MED ORDER — CIPROFLOXACIN-DEXAMETHASONE 0.3-0.1 % OT SUSP
2.0000 [drp] | Freq: Every day | OTIC | Status: DC
  Administered 2017-10-07 – 2017-10-11 (×5): 2 [drp] via OTIC
  Filled 2017-10-07 (×3): qty 7.5

## 2017-10-07 MED ORDER — TRAMADOL HCL 50 MG PO TABS
50.0000 mg | ORAL_TABLET | Freq: Four times a day (QID) | ORAL | Status: DC | PRN
  Administered 2017-10-08: 50 mg via ORAL
  Filled 2017-10-07 (×3): qty 1

## 2017-10-07 MED ORDER — DILTIAZEM HCL 30 MG PO TABS
30.0000 mg | ORAL_TABLET | Freq: Every day | ORAL | Status: DC
  Administered 2017-10-07 – 2017-10-11 (×5): 30 mg via ORAL
  Filled 2017-10-07 (×6): qty 1

## 2017-10-07 MED ORDER — ACETAMINOPHEN 325 MG PO TABS
650.0000 mg | ORAL_TABLET | Freq: Four times a day (QID) | ORAL | Status: DC | PRN
  Administered 2017-10-07 (×2): 650 mg via ORAL
  Filled 2017-10-07 (×3): qty 2
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
## 2017-10-08 MED ORDER — MAGNESIUM HYDROXIDE 400 MG/5ML PO SUSP
5.0000 mL | Freq: Every day | ORAL | Status: DC
  Administered 2017-10-08 – 2017-10-10 (×3): 5 mL via ORAL
  Filled 2017-10-08 (×4): qty 30

## 2017-10-08 MED ORDER — ACETAMINOPHEN 325 MG PO TABS
650.0000 mg | ORAL_TABLET | Freq: Four times a day (QID) | ORAL | Status: DC
  Administered 2017-10-08 – 2017-10-11 (×13): 650 mg via ORAL
  Filled 2017-10-08 (×15): qty 2

## 2017-10-08 MED ORDER — DOCUSATE SODIUM 100 MG PO CAPS
100.0000 mg | ORAL_CAPSULE | Freq: Every day | ORAL | Status: DC
  Administered 2017-10-08 – 2017-10-11 (×4): 100 mg via ORAL
  Filled 2017-10-08 (×4): qty 1

## 2017-10-08 MED ORDER — METHOCARBAMOL 500 MG PO TABS
500.0000 mg | ORAL_TABLET | Freq: Three times a day (TID) | ORAL | Status: DC
  Administered 2017-10-08 – 2017-10-11 (×10): 500 mg via ORAL
  Filled 2017-10-08 (×10): qty 1

## 2017-10-08 NOTE — Clinical Social Work Note (Signed)
Clinical Social Work Assessment  Patient Details  Name: Terri Ponce MRN: [id number] Date of Birth: 1928/06/14  Date of referral:  10/08/17               Reason for consult:  Facility Placement                Permission sought to share information with:  Chartered certified accountant granted to share information::  Yes, Verbal Permission Granted  Name::     Nurse, adult::  SNF  Relationship::  daughter  Contact Information:     Housing/Transportation Living arrangements for the past 2 months:  Single Family Home Source of Information:  Patient, Adult Children Patient Interpreter Needed:  None Criminal Activity/Legal Involvement Pertinent to Current Situation/Hospitalization:  No - Comment as needed Significant Relationships:  Adult Children, Other Family Members Lives with:  Self Do you feel safe going back to the place where you live?  No Need for family participation in patient care:  Yes (Comment)  Care giving concerns:  Pt resides home alone and will kneed SNF at discharge.  Social Worker assessment / plan:  CSW met with patient and daughter at bedside. CSW discussed the SNF recommendation. Pt agreeable and indicated that she resides alone and will need assistance. Pt reports that she was independent with ADL's prior to placement.  Employment status:  Retired Nurse, adult PT Recommendations:  [place] / Referral to community resources:  [place]  Patient/Family's Response to care:  Pt/daughter agreeable to SNF and thanked CSW for meeting to discuss options.  Patient/Family's Understanding of and Emotional Response to Diagnosis, Current Treatment, and Prognosis:  Patient/daughter has good understanding of diagnosis and appear to be in good emotional state. Pt resides alone at home and will need SNF at discharge and return home thereafter. Pt looking forward to improving and returning to her  normalcy.  [facility] as she has friends there and is known to provide good care. CSW explained the possible barriers with placement in [place] and will still send out at appropriate time. CSW will assist with disposition.  Emotional Assessment Appearance:  Appears stated age Attitude/Demeanor/Rapport:  (Cooperative) Affect (typically observed):  Accepting, Appropriate Orientation:  Oriented to Situation, Oriented to  Time, Oriented to Place, Oriented to Self Alcohol / Substance use:  Not Applicable Psych involvement (Current and /or in the community):  No (Comment)  Discharge Needs  Concerns to be addressed:  Discharge Planning Concerns Readmission within the last 30 days:  No Current discharge risk:  Lives alone, Physical Impairment, Dependent with Mobility Barriers to Discharge:  No Barriers Identified   Normajean Baxter, LCSW 10/08/2017, 12:07 PM
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
## 2017-10-08 NOTE — Social Work (Signed)
SBIRT completed. No risk factors.  Elissa Hefty, LCSW Clinical Social Worker [phone]
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
## 2017-10-08 NOTE — NC FL2 (Signed)
[place] LEVEL OF CARE SCREENING TOOL     IDENTIFICATION  Patient Name: Terri Ponce Birthdate: 1929-01-25 Sex: female Admission Date (Current Location): 10/06/2017  [facility] and [place] Number:  Herbalist and Address:  The . [facility], [place] [address] [place], [place] [address]      Provider Number: [id number]  Attending Physician Name and Address:  Md, Trauma, MD  Relative Name and Phone Number:  Ziah Turvey, daughter, [phone]    Current Level of Care: Hospital Recommended Level of Care: [place] Prior Approval Number:    Date Approved/Denied:   PASRR Number: [id number]  Discharge Plan: SNF    Current Diagnoses: Patient Active Problem List   Diagnosis Date Noted  . Fracture of manubrium, initial encounter for closed fracture 10/06/2017  . Traumatic closed fracture of distal clavicle with minimal displacement, left, initial encounter 10/06/2017  . Neck pain 10/13/2016  . Carotid bruit 10/13/2016  . T wave inversion in EKG 03/23/2016  . Multiple thyroid nodules   . Blood transfusion without reported diagnosis   . Hypertension   . PAF (paroxysmal atrial fibrillation) ([place]) 04/01/2014  . Malignant neoplasm of upper-inner quadrant of left breast in female, estrogen receptor positive ([place]) 07/30/2013  . GERD 03/18/2009  . CONDUCTIVE HEARING LOSS TYMPANIC MEMBRANE 12/24/2008    Orientation RESPIRATION BLADDER Height & Weight     Self, Time, Situation, Place  Normal Continent Weight: 168 lb (76.2 kg) Height:     BEHAVIORAL SYMPTOMS/MOOD NEUROLOGICAL BOWEL NUTRITION STATUS      Continent Diet(See DC Summary)  AMBULATORY STATUS COMMUNICATION OF NEEDS Skin   Limited Assist Verbally Bruising, Other (Comment)(L clacvicle fx, Manubrium fx, T3 fx, Lumbar fx, Abrasions)                       Personal Care Assistance Level of Assistance  Dressing, Feeding, Bathing Bathing Assistance: Maximum  assistance Feeding assistance: Limited assistance Dressing Assistance: Maximum assistance     Functional Limitations Info  Sight, Hearing, Speech Sight Info: Adequate Hearing Info: Impaired Speech Info: Adequate    SPECIAL CARE FACTORS FREQUENCY  PT (By licensed PT), OT (By licensed OT)     PT Frequency: 5x week OT Frequency: 5x week            Contractures      Additional Factors Info  Code Status, Allergies Code Status Info: Full  Allergies Info: SULFONAMIDE DERIVATIVES, OTHER, SULFACETAMIDE, AMOXICILLIN, CIPRO HC CIPROFLOXACIN-HYDROCORTISONE            Current Medications (10/08/2017):  This is the current hospital active medication list Current Facility-Administered Medications  Medication Dose Route Frequency Provider Last Rate Last Dose  . 0.9 %  sodium chloride infusion  250 mL Intravenous PRN Georganna Skeans, MD      . acetaminophen (TYLENOL) tablet 650 mg  650 mg Oral Q6H Focht, Jessica L, PA   650 mg at 10/08/17 1050  . bacitracin ointment   Topical Daily Romana Juniper A, MD      . ciprofloxacin-dexamethasone (CIPRODEX) 0.3-0.1 % OTIC (EAR) suspension 2 drop  2 drop Left EAR Daily Romana Juniper A, MD   2 drop at 10/08/17 1054  . diltiazem (CARDIZEM) tablet 30 mg  30 mg Oral Daily Romana Juniper A, MD   30 mg at 10/07/17 1223  . docusate sodium (COLACE) capsule 100 mg  100 mg Oral Daily Focht, Jessica L, PA   100 mg at 10/08/17 1053  .  latanoprost (XALATAN) 0.005 % ophthalmic solution 1 drop  1 drop Both Eyes QHS Romana Juniper A, MD   1 drop at 10/07/17 2147  . magnesium hydroxide (MILK OF MAGNESIA) suspension 5 mL  5 mL Oral Daily Focht, Jessica L, PA   5 mL at 10/08/17 1053  . methocarbamol (ROBAXIN) tablet 500 mg  500 mg Oral TID Focht, Jessica L, PA   500 mg at 10/08/17 1051  . ondansetron (ZOFRAN-ODT) disintegrating tablet 4 mg  4 mg Oral Q6H PRN Georganna Skeans, MD       Or  . ondansetron [facility] injection 4 mg  4 mg Intravenous Q6H PRN Georganna Skeans, MD   4 mg at 10/08/17 0231  . sodium chloride flush (NS) 0.9 % injection 3 mL  3 mL Intravenous Q12H Georganna Skeans, MD   3 mL at 10/08/17 1052  . sodium chloride flush (NS) 0.9 % injection 3 mL  3 mL Intravenous PRN Georganna Skeans, MD      . traMADol Veatrice Bourbon) tablet 50 mg  50 mg Oral Q6H PRN Clovis Riley, MD   50 mg at 10/08/17 0226     Discharge Medications: Please see discharge summary for a list of discharge medications.  Relevant Imaging Results:  Relevant Lab Results:   Additional Information SS#: [id number]  Normajean Baxter, LCSW

## 2017-10-09 MED ORDER — APIXABAN 5 MG PO TABS
5.0000 mg | ORAL_TABLET | Freq: Two times a day (BID) | ORAL | Status: DC
  Administered 2017-10-09 – 2017-10-11 (×3): 5 mg via ORAL
  Filled 2017-10-09 (×3): qty 1
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
## 2017-10-09 NOTE — Plan of Care (Signed)
  Problem: Health Behavior/Discharge Planning: Goal: Ability to manage health-related needs will improve Outcome: Progressing   
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
## 2017-10-09 NOTE — Plan of Care (Signed)
  Problem: Clinical Measurements: Goal: Ability to maintain clinical measurements within normal limits will improve Outcome: Progressing   Problem: Activity: Goal: Risk for activity intolerance will decrease Outcome: Progressing   Problem: Pain Managment: Goal: General experience of comfort will improve Outcome: Progressing   

## 2017-10-09 NOTE — Clinical Social Work Note (Signed)
Clinical Social Worker continuing to follow patient and family for support and discharge planning needs.  CSW provided patient and patient daughter with available bed offers - patient daughter visiting facilities and will notify CSW of bed choice.  CSW explained the importance of choosing a facility due to the need for insurance authorization.  CSW remains available for support and to facilitate patient discharge needs.  Barbette Or, [place]

## 2017-10-10 ENCOUNTER — Encounter (HOSPITAL_COMMUNITY): Payer: Self-pay | Admitting: General Practice

## 2017-10-10 MED ORDER — BISACODYL 10 MG RE SUPP
10.0000 mg | Freq: Every day | RECTAL | Status: DC | PRN
  Administered 2017-10-10: 10 mg via RECTAL
  Filled 2017-10-10: qty 1
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
## 2017-10-11 DIAGNOSIS — Z743 Need for continuous supervision: Secondary | ICD-10-CM | POA: Diagnosis not present

## 2017-10-11 DIAGNOSIS — S42002A Fracture of unspecified part of left clavicle, initial encounter for closed fracture: Secondary | ICD-10-CM | POA: Diagnosis not present

## 2017-10-11 DIAGNOSIS — M6281 Muscle weakness (generalized): Secondary | ICD-10-CM | POA: Diagnosis not present

## 2017-10-11 DIAGNOSIS — R279 Unspecified lack of coordination: Secondary | ICD-10-CM | POA: Diagnosis not present

## 2017-10-11 DIAGNOSIS — S32030K Wedge compression fracture of third lumbar vertebra, subsequent encounter for fracture with nonunion: Secondary | ICD-10-CM | POA: Diagnosis not present

## 2017-10-11 DIAGNOSIS — M25512 Pain in left shoulder: Secondary | ICD-10-CM | POA: Diagnosis not present

## 2017-10-11 DIAGNOSIS — S42032D Displaced fracture of lateral end of left clavicle, subsequent encounter for fracture with routine healing: Secondary | ICD-10-CM | POA: Diagnosis not present

## 2017-10-11 DIAGNOSIS — S42009A Fracture of unspecified part of unspecified clavicle, initial encounter for closed fracture: Secondary | ICD-10-CM | POA: Diagnosis not present

## 2017-10-11 DIAGNOSIS — M542 Cervicalgia: Secondary | ICD-10-CM | POA: Diagnosis not present

## 2017-10-11 DIAGNOSIS — S22039A Unspecified fracture of third thoracic vertebra, initial encounter for closed fracture: Secondary | ICD-10-CM | POA: Diagnosis not present

## 2017-10-11 DIAGNOSIS — I480 Paroxysmal atrial fibrillation: Secondary | ICD-10-CM | POA: Diagnosis not present

## 2017-10-11 DIAGNOSIS — S32028D Other fracture of second lumbar vertebra, subsequent encounter for fracture with routine healing: Secondary | ICD-10-CM | POA: Diagnosis not present

## 2017-10-11 DIAGNOSIS — S42002D Fracture of unspecified part of left clavicle, subsequent encounter for fracture with routine healing: Secondary | ICD-10-CM | POA: Diagnosis not present

## 2017-10-11 DIAGNOSIS — K219 Gastro-esophageal reflux disease without esophagitis: Secondary | ICD-10-CM | POA: Diagnosis not present

## 2017-10-11 DIAGNOSIS — C50919 Malignant neoplasm of unspecified site of unspecified female breast: Secondary | ICD-10-CM | POA: Diagnosis not present

## 2017-10-11 DIAGNOSIS — S32020G Wedge compression fracture of second lumbar vertebra, subsequent encounter for fracture with delayed healing: Secondary | ICD-10-CM | POA: Diagnosis not present

## 2017-10-11 DIAGNOSIS — I10 Essential (primary) hypertension: Secondary | ICD-10-CM | POA: Diagnosis not present

## 2017-10-11 DIAGNOSIS — I4891 Unspecified atrial fibrillation: Secondary | ICD-10-CM | POA: Diagnosis not present

## 2017-10-11 DIAGNOSIS — R531 Weakness: Secondary | ICD-10-CM | POA: Diagnosis not present

## 2017-10-11 DIAGNOSIS — C50212 Malignant neoplasm of upper-inner quadrant of left female breast: Secondary | ICD-10-CM | POA: Diagnosis not present

## 2017-10-11 DIAGNOSIS — M810 Age-related osteoporosis without current pathological fracture: Secondary | ICD-10-CM | POA: Diagnosis not present

## 2017-10-11 DIAGNOSIS — H9190 Unspecified hearing loss, unspecified ear: Secondary | ICD-10-CM | POA: Diagnosis not present

## 2017-10-11 DIAGNOSIS — E042 Nontoxic multinodular goiter: Secondary | ICD-10-CM | POA: Diagnosis not present

## 2017-10-11 DIAGNOSIS — S2221XA Fracture of manubrium, initial encounter for closed fracture: Secondary | ICD-10-CM | POA: Diagnosis not present

## 2017-10-11 DIAGNOSIS — S22030A Wedge compression fracture of third thoracic vertebra, initial encounter for closed fracture: Secondary | ICD-10-CM | POA: Diagnosis not present

## 2017-10-11 DIAGNOSIS — S22030D Wedge compression fracture of third thoracic vertebra, subsequent encounter for fracture with routine healing: Secondary | ICD-10-CM | POA: Diagnosis not present

## 2017-10-11 DIAGNOSIS — R278 Other lack of coordination: Secondary | ICD-10-CM | POA: Diagnosis not present

## 2017-10-11 DIAGNOSIS — R2689 Other abnormalities of gait and mobility: Secondary | ICD-10-CM | POA: Diagnosis not present

## 2017-10-11 DIAGNOSIS — F0390 Unspecified dementia without behavioral disturbance: Secondary | ICD-10-CM | POA: Diagnosis not present

## 2017-10-11 MED ORDER — METHOCARBAMOL 500 MG PO TABS: 500.0000 mg | ORAL_TABLET | Freq: Three times a day (TID) | ORAL | 0 refills | Status: DC

## 2017-10-11 MED ORDER — MAGNESIUM HYDROXIDE 400 MG/5ML PO SUSP: 5.0000 mL | Freq: Every day | ORAL | 0 refills | Status: DC

## 2017-10-11 MED ORDER — DOCUSATE SODIUM 100 MG PO CAPS: 100.0000 mg | ORAL_CAPSULE | Freq: Every day | ORAL | 0 refills | Status: DC

## 2017-10-11 MED ORDER — TRAMADOL HCL 50 MG PO TABS: 50.0000 mg | ORAL_TABLET | Freq: Four times a day (QID) | ORAL | 0 refills | Status: DC | PRN

## 2017-10-11 NOTE — Progress Notes (Addendum)
Patient received Ducolax suppository at 2310 last night.  She had small hard BM around 0030.  Patient stated that she feels impacted.  Offered to assist with disimpaction. Patient refused several times.  Patient stated that she needs something stronger to help her have a full BM.  Will leave note for MD to address in morning.  Patient is currently back in bed.

## 2017-10-11 NOTE — Care Management Important Message (Signed)
Important Message  Patient Details  Name: Terri Ponce MRN: [id number] Date of Birth: 06-22-1928   Medicare Important Message Given:  Yes    [name] Montine Circle 10/11/2017, 10:41 AM

## 2017-10-11 NOTE — Progress Notes (Signed)
[name] discharged to [facility] nursing [facility] with daughter and Corey Harold AVS went over with and given to  Patient's daughter.   Patient taken to discharge lobby via PTAR.  Vitals:   10/10/17 2048 10/11/17 0432  BP: (!) 154/68 (!) 150/69  Pulse: 71 89  Resp:    Temp: 98 F (36.7 C) 99.4 F (37.4 C)  SpO2: 94% 92%     Julieanne Cotton, RN

## 2017-10-11 NOTE — Plan of Care (Signed)
  Problem: Health Behavior/Discharge Planning: Goal: Ability to manage health-related needs will improve Outcome: Completed/Met   Problem: Activity: Goal: Risk for activity intolerance will decrease Outcome: Completed/Met   Problem: Nutrition: Goal: Adequate nutrition will be maintained Outcome: Completed/Met   Problem: Elimination: Goal: Will not experience complications related to bowel motility Outcome: Completed/Met Goal: Will not experience complications related to urinary retention Outcome: Completed/Met   Problem: Pain Managment: Goal: General experience of comfort will improve Outcome: Completed/Met   Problem: Safety: Goal: Ability to remain free from injury will improve Outcome: Completed/Met   Problem: Skin Integrity: Goal: Risk for impaired skin integrity will decrease Outcome: Completed/Met

## 2017-10-11 NOTE — Progress Notes (Signed)
Report called to [place] at [facility]. No further questions at this time. Discharge paperwork, AVS and prescriptions placed in packet and clipped to PTAR paperwork. Awaiting transport.
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
## 2017-10-11 NOTE — Clinical Social Work Note (Signed)
Clinical Social Worker facilitated patient discharge including contacting patient family and facility to confirm patient discharge plans.  Clinical information faxed to facility and family agreeable with plan.  CSW arranged ambulance transport via [facility] to [facility].  RN to call report prior to discharge.  Room Number [phone] Number for Report [phone]  Clinical Social Worker will sign off for now as social work intervention is no longer needed. Please consult [place] again if new need arises.  Barbette Or, [place]

## 2017-10-11 NOTE — Progress Notes (Signed)
Patient finally had large BM.
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
## 2017-10-11 NOTE — Clinical Social Work Placement (Signed)
   CLINICAL SOCIAL WORK PLACEMENT  NOTE  Date:  10/11/2017  Patient Details  Name: Terri Ponce MRN: [id number] Date of Birth: [birth year]  Clinical Social Work is seeking post-discharge placement for this patient at the [place] level of care (*CSW will initial, date and re-position this form in  chart as items are completed):  Yes   Patient/family provided with [place] Work Department's list of facilities offering this level of care within the geographic area requested by the patient (or if unable, by the patient's family).  Yes   Patient/family informed of their freedom to choose among providers that offer the needed level of care, that participate in Medicare, Medicaid or managed care program needed by the patient, have an available bed and are willing to accept the patient.  Yes   Patient/family informed of [facility]'s ownership interest in [facility] and [facility], as well as of the fact that they are under no obligation to receive care at these facilities.  PASRR submitted to EDS on 10/08/17     PASRR number received on 10/08/17     Existing PASRR number confirmed on       FL2 transmitted to all facilities in geographic area requested by pt/family on 10/08/17     FL2 transmitted to all facilities within larger geographic area on       Patient informed that his/her managed care company has contracts with or will negotiate with certain facilities, including the following:        Yes   Patient/family informed of bed offers received.  Patient chooses bed at [facility]     Physician recommends and patient chooses bed at      Patient to be transferred to [facility] on 10/11/17.  Patient to be transferred to facility by Ambulance     Patient family notified on 10/11/17 of transfer.  Name of family member notified:  Patient daughter Terri Ponce     PHYSICIAN Please prepare priority discharge  summary, including medications     Additional Comment:   Barbette Or, [place]

## 2017-10-11 NOTE — Discharge Instructions (Signed)
Sling on left upper extremity for comfort Encourage continued use of incentive spirometer Encourage ambulation

## 2017-10-12 DIAGNOSIS — S22030D Wedge compression fracture of third thoracic vertebra, subsequent encounter for fracture with routine healing: Secondary | ICD-10-CM | POA: Diagnosis not present

## 2017-10-12 DIAGNOSIS — S32028D Other fracture of second lumbar vertebra, subsequent encounter for fracture with routine healing: Secondary | ICD-10-CM | POA: Diagnosis not present

## 2017-10-12 DIAGNOSIS — I480 Paroxysmal atrial fibrillation: Secondary | ICD-10-CM | POA: Diagnosis not present

## 2017-10-12 DIAGNOSIS — S42002D Fracture of unspecified part of left clavicle, subsequent encounter for fracture with routine healing: Secondary | ICD-10-CM | POA: Diagnosis not present

## 2017-10-19 DIAGNOSIS — S32028D Other fracture of second lumbar vertebra, subsequent encounter for fracture with routine healing: Secondary | ICD-10-CM | POA: Diagnosis not present

## 2017-10-19 DIAGNOSIS — S22030D Wedge compression fracture of third thoracic vertebra, subsequent encounter for fracture with routine healing: Secondary | ICD-10-CM | POA: Diagnosis not present

## 2017-10-19 DIAGNOSIS — S42002D Fracture of unspecified part of left clavicle, subsequent encounter for fracture with routine healing: Secondary | ICD-10-CM | POA: Diagnosis not present

## 2017-10-19 DIAGNOSIS — I480 Paroxysmal atrial fibrillation: Secondary | ICD-10-CM | POA: Diagnosis not present

## 2017-10-26 DIAGNOSIS — M25512 Pain in left shoulder: Secondary | ICD-10-CM | POA: Diagnosis not present

## 2017-10-27 DIAGNOSIS — I480 Paroxysmal atrial fibrillation: Secondary | ICD-10-CM | POA: Diagnosis not present

## 2017-10-27 DIAGNOSIS — S42002D Fracture of unspecified part of left clavicle, subsequent encounter for fracture with routine healing: Secondary | ICD-10-CM | POA: Diagnosis not present

## 2017-10-27 DIAGNOSIS — S32028D Other fracture of second lumbar vertebra, subsequent encounter for fracture with routine healing: Secondary | ICD-10-CM | POA: Diagnosis not present

## 2017-10-27 DIAGNOSIS — I10 Essential (primary) hypertension: Secondary | ICD-10-CM | POA: Diagnosis not present

## 2017-10-30 DIAGNOSIS — S22030A Wedge compression fracture of third thoracic vertebra, initial encounter for closed fracture: Secondary | ICD-10-CM | POA: Diagnosis not present

## 2017-11-02 DIAGNOSIS — F0390 Unspecified dementia without behavioral disturbance: Secondary | ICD-10-CM | POA: Diagnosis not present

## 2017-11-02 DIAGNOSIS — S22030D Wedge compression fracture of third thoracic vertebra, subsequent encounter for fracture with routine healing: Secondary | ICD-10-CM | POA: Diagnosis not present

## 2017-11-02 DIAGNOSIS — I480 Paroxysmal atrial fibrillation: Secondary | ICD-10-CM | POA: Diagnosis not present

## 2017-11-02 DIAGNOSIS — S42002D Fracture of unspecified part of left clavicle, subsequent encounter for fracture with routine healing: Secondary | ICD-10-CM | POA: Diagnosis not present

## 2017-11-02 DIAGNOSIS — S32030K Wedge compression fracture of third lumbar vertebra, subsequent encounter for fracture with nonunion: Secondary | ICD-10-CM | POA: Diagnosis not present

## 2017-11-02 DIAGNOSIS — M810 Age-related osteoporosis without current pathological fracture: Secondary | ICD-10-CM | POA: Diagnosis not present

## 2017-11-02 DIAGNOSIS — I10 Essential (primary) hypertension: Secondary | ICD-10-CM | POA: Diagnosis not present

## 2017-11-02 DIAGNOSIS — S42009A Fracture of unspecified part of unspecified clavicle, initial encounter for closed fracture: Secondary | ICD-10-CM | POA: Diagnosis not present

## 2017-11-02 DIAGNOSIS — R531 Weakness: Secondary | ICD-10-CM | POA: Diagnosis not present

## 2017-11-02 DIAGNOSIS — S32028D Other fracture of second lumbar vertebra, subsequent encounter for fracture with routine healing: Secondary | ICD-10-CM | POA: Diagnosis not present

## 2017-11-04 DIAGNOSIS — S32028D Other fracture of second lumbar vertebra, subsequent encounter for fracture with routine healing: Secondary | ICD-10-CM | POA: Diagnosis not present

## 2017-11-04 DIAGNOSIS — S2221XD Fracture of manubrium, subsequent encounter for fracture with routine healing: Secondary | ICD-10-CM | POA: Diagnosis not present

## 2017-11-04 DIAGNOSIS — I480 Paroxysmal atrial fibrillation: Secondary | ICD-10-CM | POA: Diagnosis not present

## 2017-11-04 DIAGNOSIS — S42032D Displaced fracture of lateral end of left clavicle, subsequent encounter for fracture with routine healing: Secondary | ICD-10-CM | POA: Diagnosis not present

## 2017-11-04 DIAGNOSIS — C50212 Malignant neoplasm of upper-inner quadrant of left female breast: Secondary | ICD-10-CM | POA: Diagnosis not present

## 2017-11-04 DIAGNOSIS — S22030D Wedge compression fracture of third thoracic vertebra, subsequent encounter for fracture with routine healing: Secondary | ICD-10-CM | POA: Diagnosis not present

## 2017-11-05 DIAGNOSIS — I480 Paroxysmal atrial fibrillation: Secondary | ICD-10-CM | POA: Diagnosis not present

## 2017-11-05 DIAGNOSIS — C50212 Malignant neoplasm of upper-inner quadrant of left female breast: Secondary | ICD-10-CM | POA: Diagnosis not present

## 2017-11-05 DIAGNOSIS — R278 Other lack of coordination: Secondary | ICD-10-CM | POA: Diagnosis not present

## 2017-11-05 DIAGNOSIS — R2689 Other abnormalities of gait and mobility: Secondary | ICD-10-CM | POA: Diagnosis not present

## 2017-11-05 DIAGNOSIS — S32028D Other fracture of second lumbar vertebra, subsequent encounter for fracture with routine healing: Secondary | ICD-10-CM | POA: Diagnosis not present

## 2017-11-05 DIAGNOSIS — S22030D Wedge compression fracture of third thoracic vertebra, subsequent encounter for fracture with routine healing: Secondary | ICD-10-CM | POA: Diagnosis not present

## 2017-11-05 DIAGNOSIS — S42032D Displaced fracture of lateral end of left clavicle, subsequent encounter for fracture with routine healing: Secondary | ICD-10-CM | POA: Diagnosis not present

## 2017-11-05 DIAGNOSIS — S2221XD Fracture of manubrium, subsequent encounter for fracture with routine healing: Secondary | ICD-10-CM | POA: Diagnosis not present

## 2017-11-06 DIAGNOSIS — S2221XD Fracture of manubrium, subsequent encounter for fracture with routine healing: Secondary | ICD-10-CM | POA: Diagnosis not present

## 2017-11-06 DIAGNOSIS — C50212 Malignant neoplasm of upper-inner quadrant of left female breast: Secondary | ICD-10-CM | POA: Diagnosis not present

## 2017-11-06 DIAGNOSIS — S22030D Wedge compression fracture of third thoracic vertebra, subsequent encounter for fracture with routine healing: Secondary | ICD-10-CM | POA: Diagnosis not present

## 2017-11-06 DIAGNOSIS — I480 Paroxysmal atrial fibrillation: Secondary | ICD-10-CM | POA: Diagnosis not present

## 2017-11-06 DIAGNOSIS — S32028D Other fracture of second lumbar vertebra, subsequent encounter for fracture with routine healing: Secondary | ICD-10-CM | POA: Diagnosis not present

## 2017-11-06 DIAGNOSIS — S42032D Displaced fracture of lateral end of left clavicle, subsequent encounter for fracture with routine healing: Secondary | ICD-10-CM | POA: Diagnosis not present

## 2017-11-07 DIAGNOSIS — C50212 Malignant neoplasm of upper-inner quadrant of left female breast: Secondary | ICD-10-CM | POA: Diagnosis not present

## 2017-11-07 DIAGNOSIS — S2221XD Fracture of manubrium, subsequent encounter for fracture with routine healing: Secondary | ICD-10-CM | POA: Diagnosis not present

## 2017-11-07 DIAGNOSIS — I480 Paroxysmal atrial fibrillation: Secondary | ICD-10-CM | POA: Diagnosis not present

## 2017-11-07 DIAGNOSIS — S32028D Other fracture of second lumbar vertebra, subsequent encounter for fracture with routine healing: Secondary | ICD-10-CM | POA: Diagnosis not present

## 2017-11-07 DIAGNOSIS — S22030D Wedge compression fracture of third thoracic vertebra, subsequent encounter for fracture with routine healing: Secondary | ICD-10-CM | POA: Diagnosis not present

## 2017-11-07 DIAGNOSIS — S42032D Displaced fracture of lateral end of left clavicle, subsequent encounter for fracture with routine healing: Secondary | ICD-10-CM | POA: Diagnosis not present

## 2017-11-08 DIAGNOSIS — S2221XD Fracture of manubrium, subsequent encounter for fracture with routine healing: Secondary | ICD-10-CM | POA: Diagnosis not present

## 2017-11-08 DIAGNOSIS — C50212 Malignant neoplasm of upper-inner quadrant of left female breast: Secondary | ICD-10-CM | POA: Diagnosis not present

## 2017-11-08 DIAGNOSIS — S22030D Wedge compression fracture of third thoracic vertebra, subsequent encounter for fracture with routine healing: Secondary | ICD-10-CM | POA: Diagnosis not present

## 2017-11-08 DIAGNOSIS — S42032D Displaced fracture of lateral end of left clavicle, subsequent encounter for fracture with routine healing: Secondary | ICD-10-CM | POA: Diagnosis not present

## 2017-11-08 DIAGNOSIS — S32028D Other fracture of second lumbar vertebra, subsequent encounter for fracture with routine healing: Secondary | ICD-10-CM | POA: Diagnosis not present

## 2017-11-08 DIAGNOSIS — I480 Paroxysmal atrial fibrillation: Secondary | ICD-10-CM | POA: Diagnosis not present

## 2017-11-12 DIAGNOSIS — S32028D Other fracture of second lumbar vertebra, subsequent encounter for fracture with routine healing: Secondary | ICD-10-CM | POA: Diagnosis not present

## 2017-11-12 DIAGNOSIS — C50212 Malignant neoplasm of upper-inner quadrant of left female breast: Secondary | ICD-10-CM | POA: Diagnosis not present

## 2017-11-12 DIAGNOSIS — I480 Paroxysmal atrial fibrillation: Secondary | ICD-10-CM | POA: Diagnosis not present

## 2017-11-12 DIAGNOSIS — S2221XD Fracture of manubrium, subsequent encounter for fracture with routine healing: Secondary | ICD-10-CM | POA: Diagnosis not present

## 2017-11-12 DIAGNOSIS — S22030D Wedge compression fracture of third thoracic vertebra, subsequent encounter for fracture with routine healing: Secondary | ICD-10-CM | POA: Diagnosis not present

## 2017-11-12 DIAGNOSIS — S42032D Displaced fracture of lateral end of left clavicle, subsequent encounter for fracture with routine healing: Secondary | ICD-10-CM | POA: Diagnosis not present

## 2017-11-14 DIAGNOSIS — S2221XD Fracture of manubrium, subsequent encounter for fracture with routine healing: Secondary | ICD-10-CM | POA: Diagnosis not present

## 2017-11-14 DIAGNOSIS — I480 Paroxysmal atrial fibrillation: Secondary | ICD-10-CM | POA: Diagnosis not present

## 2017-11-14 DIAGNOSIS — S22030D Wedge compression fracture of third thoracic vertebra, subsequent encounter for fracture with routine healing: Secondary | ICD-10-CM | POA: Diagnosis not present

## 2017-11-14 DIAGNOSIS — C50212 Malignant neoplasm of upper-inner quadrant of left female breast: Secondary | ICD-10-CM | POA: Diagnosis not present

## 2017-11-14 DIAGNOSIS — S42032D Displaced fracture of lateral end of left clavicle, subsequent encounter for fracture with routine healing: Secondary | ICD-10-CM | POA: Diagnosis not present

## 2017-11-14 DIAGNOSIS — S32028D Other fracture of second lumbar vertebra, subsequent encounter for fracture with routine healing: Secondary | ICD-10-CM | POA: Diagnosis not present

## 2017-11-15 DIAGNOSIS — S2221XD Fracture of manubrium, subsequent encounter for fracture with routine healing: Secondary | ICD-10-CM | POA: Diagnosis not present

## 2017-11-15 DIAGNOSIS — S32028D Other fracture of second lumbar vertebra, subsequent encounter for fracture with routine healing: Secondary | ICD-10-CM | POA: Diagnosis not present

## 2017-11-15 DIAGNOSIS — C50212 Malignant neoplasm of upper-inner quadrant of left female breast: Secondary | ICD-10-CM | POA: Diagnosis not present

## 2017-11-15 DIAGNOSIS — S22030D Wedge compression fracture of third thoracic vertebra, subsequent encounter for fracture with routine healing: Secondary | ICD-10-CM | POA: Diagnosis not present

## 2017-11-15 DIAGNOSIS — I480 Paroxysmal atrial fibrillation: Secondary | ICD-10-CM | POA: Diagnosis not present

## 2017-11-15 DIAGNOSIS — S42032D Displaced fracture of lateral end of left clavicle, subsequent encounter for fracture with routine healing: Secondary | ICD-10-CM | POA: Diagnosis not present

## 2017-11-16 DIAGNOSIS — M25512 Pain in left shoulder: Secondary | ICD-10-CM | POA: Diagnosis not present

## 2017-11-16 DIAGNOSIS — S42032D Displaced fracture of lateral end of left clavicle, subsequent encounter for fracture with routine healing: Secondary | ICD-10-CM | POA: Diagnosis not present

## 2017-11-16 DIAGNOSIS — S2221XD Fracture of manubrium, subsequent encounter for fracture with routine healing: Secondary | ICD-10-CM | POA: Diagnosis not present

## 2017-11-16 DIAGNOSIS — I480 Paroxysmal atrial fibrillation: Secondary | ICD-10-CM | POA: Diagnosis not present

## 2017-11-16 DIAGNOSIS — S22030D Wedge compression fracture of third thoracic vertebra, subsequent encounter for fracture with routine healing: Secondary | ICD-10-CM | POA: Diagnosis not present

## 2017-11-16 DIAGNOSIS — S32028D Other fracture of second lumbar vertebra, subsequent encounter for fracture with routine healing: Secondary | ICD-10-CM | POA: Diagnosis not present

## 2017-11-16 DIAGNOSIS — C50212 Malignant neoplasm of upper-inner quadrant of left female breast: Secondary | ICD-10-CM | POA: Diagnosis not present

## 2017-11-19 DIAGNOSIS — S32028D Other fracture of second lumbar vertebra, subsequent encounter for fracture with routine healing: Secondary | ICD-10-CM | POA: Diagnosis not present

## 2017-11-19 DIAGNOSIS — S2221XD Fracture of manubrium, subsequent encounter for fracture with routine healing: Secondary | ICD-10-CM | POA: Diagnosis not present

## 2017-11-19 DIAGNOSIS — C50212 Malignant neoplasm of upper-inner quadrant of left female breast: Secondary | ICD-10-CM | POA: Diagnosis not present

## 2017-11-19 DIAGNOSIS — S22030D Wedge compression fracture of third thoracic vertebra, subsequent encounter for fracture with routine healing: Secondary | ICD-10-CM | POA: Diagnosis not present

## 2017-11-19 DIAGNOSIS — I480 Paroxysmal atrial fibrillation: Secondary | ICD-10-CM | POA: Diagnosis not present

## 2017-11-19 DIAGNOSIS — S42032D Displaced fracture of lateral end of left clavicle, subsequent encounter for fracture with routine healing: Secondary | ICD-10-CM | POA: Diagnosis not present

## 2017-11-21 DIAGNOSIS — S22030D Wedge compression fracture of third thoracic vertebra, subsequent encounter for fracture with routine healing: Secondary | ICD-10-CM | POA: Diagnosis not present

## 2017-11-21 DIAGNOSIS — S2221XD Fracture of manubrium, subsequent encounter for fracture with routine healing: Secondary | ICD-10-CM | POA: Diagnosis not present

## 2017-11-21 DIAGNOSIS — C50212 Malignant neoplasm of upper-inner quadrant of left female breast: Secondary | ICD-10-CM | POA: Diagnosis not present

## 2017-11-21 DIAGNOSIS — S32028D Other fracture of second lumbar vertebra, subsequent encounter for fracture with routine healing: Secondary | ICD-10-CM | POA: Diagnosis not present

## 2017-11-21 DIAGNOSIS — I480 Paroxysmal atrial fibrillation: Secondary | ICD-10-CM | POA: Diagnosis not present

## 2017-11-21 DIAGNOSIS — S42032D Displaced fracture of lateral end of left clavicle, subsequent encounter for fracture with routine healing: Secondary | ICD-10-CM | POA: Diagnosis not present

## 2017-11-23 DIAGNOSIS — S42032D Displaced fracture of lateral end of left clavicle, subsequent encounter for fracture with routine healing: Secondary | ICD-10-CM | POA: Diagnosis not present

## 2017-11-23 DIAGNOSIS — S32028D Other fracture of second lumbar vertebra, subsequent encounter for fracture with routine healing: Secondary | ICD-10-CM | POA: Diagnosis not present

## 2017-11-23 DIAGNOSIS — S22030D Wedge compression fracture of third thoracic vertebra, subsequent encounter for fracture with routine healing: Secondary | ICD-10-CM | POA: Diagnosis not present

## 2017-11-23 DIAGNOSIS — I480 Paroxysmal atrial fibrillation: Secondary | ICD-10-CM | POA: Diagnosis not present

## 2017-11-23 DIAGNOSIS — S2221XD Fracture of manubrium, subsequent encounter for fracture with routine healing: Secondary | ICD-10-CM | POA: Diagnosis not present

## 2017-11-23 DIAGNOSIS — C50212 Malignant neoplasm of upper-inner quadrant of left female breast: Secondary | ICD-10-CM | POA: Diagnosis not present

## 2017-11-26 DIAGNOSIS — C50212 Malignant neoplasm of upper-inner quadrant of left female breast: Secondary | ICD-10-CM | POA: Diagnosis not present

## 2017-11-26 DIAGNOSIS — S42032D Displaced fracture of lateral end of left clavicle, subsequent encounter for fracture with routine healing: Secondary | ICD-10-CM | POA: Diagnosis not present

## 2017-11-26 DIAGNOSIS — S2221XD Fracture of manubrium, subsequent encounter for fracture with routine healing: Secondary | ICD-10-CM | POA: Diagnosis not present

## 2017-11-26 DIAGNOSIS — S22030D Wedge compression fracture of third thoracic vertebra, subsequent encounter for fracture with routine healing: Secondary | ICD-10-CM | POA: Diagnosis not present

## 2017-11-26 DIAGNOSIS — S32028D Other fracture of second lumbar vertebra, subsequent encounter for fracture with routine healing: Secondary | ICD-10-CM | POA: Diagnosis not present

## 2017-11-26 DIAGNOSIS — I480 Paroxysmal atrial fibrillation: Secondary | ICD-10-CM | POA: Diagnosis not present

## 2017-11-27 DIAGNOSIS — S32028D Other fracture of second lumbar vertebra, subsequent encounter for fracture with routine healing: Secondary | ICD-10-CM | POA: Diagnosis not present

## 2017-11-27 DIAGNOSIS — S22030D Wedge compression fracture of third thoracic vertebra, subsequent encounter for fracture with routine healing: Secondary | ICD-10-CM | POA: Diagnosis not present

## 2017-11-27 DIAGNOSIS — I480 Paroxysmal atrial fibrillation: Secondary | ICD-10-CM | POA: Diagnosis not present

## 2017-11-27 DIAGNOSIS — S2221XD Fracture of manubrium, subsequent encounter for fracture with routine healing: Secondary | ICD-10-CM | POA: Diagnosis not present

## 2017-11-27 DIAGNOSIS — C50212 Malignant neoplasm of upper-inner quadrant of left female breast: Secondary | ICD-10-CM | POA: Diagnosis not present

## 2017-11-27 DIAGNOSIS — S42032D Displaced fracture of lateral end of left clavicle, subsequent encounter for fracture with routine healing: Secondary | ICD-10-CM | POA: Diagnosis not present

## 2017-11-28 DIAGNOSIS — S32028D Other fracture of second lumbar vertebra, subsequent encounter for fracture with routine healing: Secondary | ICD-10-CM | POA: Diagnosis not present

## 2017-11-28 DIAGNOSIS — S22030D Wedge compression fracture of third thoracic vertebra, subsequent encounter for fracture with routine healing: Secondary | ICD-10-CM | POA: Diagnosis not present

## 2017-11-28 DIAGNOSIS — S2221XD Fracture of manubrium, subsequent encounter for fracture with routine healing: Secondary | ICD-10-CM | POA: Diagnosis not present

## 2017-11-28 DIAGNOSIS — I480 Paroxysmal atrial fibrillation: Secondary | ICD-10-CM | POA: Diagnosis not present

## 2017-11-28 DIAGNOSIS — S42032D Displaced fracture of lateral end of left clavicle, subsequent encounter for fracture with routine healing: Secondary | ICD-10-CM | POA: Diagnosis not present

## 2017-11-28 DIAGNOSIS — C50212 Malignant neoplasm of upper-inner quadrant of left female breast: Secondary | ICD-10-CM | POA: Diagnosis not present

## 2017-11-29 DIAGNOSIS — S2221XD Fracture of manubrium, subsequent encounter for fracture with routine healing: Secondary | ICD-10-CM | POA: Diagnosis not present

## 2017-11-29 DIAGNOSIS — I480 Paroxysmal atrial fibrillation: Secondary | ICD-10-CM | POA: Diagnosis not present

## 2017-11-29 DIAGNOSIS — C50212 Malignant neoplasm of upper-inner quadrant of left female breast: Secondary | ICD-10-CM | POA: Diagnosis not present

## 2017-11-29 DIAGNOSIS — S32028D Other fracture of second lumbar vertebra, subsequent encounter for fracture with routine healing: Secondary | ICD-10-CM | POA: Diagnosis not present

## 2017-11-29 DIAGNOSIS — S42032D Displaced fracture of lateral end of left clavicle, subsequent encounter for fracture with routine healing: Secondary | ICD-10-CM | POA: Diagnosis not present

## 2017-11-29 DIAGNOSIS — S22030D Wedge compression fracture of third thoracic vertebra, subsequent encounter for fracture with routine healing: Secondary | ICD-10-CM | POA: Diagnosis not present

## 2017-11-30 DIAGNOSIS — C50212 Malignant neoplasm of upper-inner quadrant of left female breast: Secondary | ICD-10-CM | POA: Diagnosis not present

## 2017-11-30 DIAGNOSIS — S22030D Wedge compression fracture of third thoracic vertebra, subsequent encounter for fracture with routine healing: Secondary | ICD-10-CM | POA: Diagnosis not present

## 2017-11-30 DIAGNOSIS — S42032D Displaced fracture of lateral end of left clavicle, subsequent encounter for fracture with routine healing: Secondary | ICD-10-CM | POA: Diagnosis not present

## 2017-11-30 DIAGNOSIS — S32028D Other fracture of second lumbar vertebra, subsequent encounter for fracture with routine healing: Secondary | ICD-10-CM | POA: Diagnosis not present

## 2017-11-30 DIAGNOSIS — S2221XD Fracture of manubrium, subsequent encounter for fracture with routine healing: Secondary | ICD-10-CM | POA: Diagnosis not present

## 2017-11-30 DIAGNOSIS — I480 Paroxysmal atrial fibrillation: Secondary | ICD-10-CM | POA: Diagnosis not present

## 2017-12-03 DIAGNOSIS — I480 Paroxysmal atrial fibrillation: Secondary | ICD-10-CM | POA: Diagnosis not present

## 2017-12-03 DIAGNOSIS — S32028D Other fracture of second lumbar vertebra, subsequent encounter for fracture with routine healing: Secondary | ICD-10-CM | POA: Diagnosis not present

## 2017-12-03 DIAGNOSIS — C50212 Malignant neoplasm of upper-inner quadrant of left female breast: Secondary | ICD-10-CM | POA: Diagnosis not present

## 2017-12-03 DIAGNOSIS — S42032D Displaced fracture of lateral end of left clavicle, subsequent encounter for fracture with routine healing: Secondary | ICD-10-CM | POA: Diagnosis not present

## 2017-12-03 DIAGNOSIS — S2221XD Fracture of manubrium, subsequent encounter for fracture with routine healing: Secondary | ICD-10-CM | POA: Diagnosis not present

## 2017-12-03 DIAGNOSIS — S22030D Wedge compression fracture of third thoracic vertebra, subsequent encounter for fracture with routine healing: Secondary | ICD-10-CM | POA: Diagnosis not present

## 2017-12-04 DIAGNOSIS — S32028D Other fracture of second lumbar vertebra, subsequent encounter for fracture with routine healing: Secondary | ICD-10-CM | POA: Diagnosis not present

## 2017-12-04 DIAGNOSIS — S42032D Displaced fracture of lateral end of left clavicle, subsequent encounter for fracture with routine healing: Secondary | ICD-10-CM | POA: Diagnosis not present

## 2017-12-04 DIAGNOSIS — I480 Paroxysmal atrial fibrillation: Secondary | ICD-10-CM | POA: Diagnosis not present

## 2017-12-04 DIAGNOSIS — S22030A Wedge compression fracture of third thoracic vertebra, initial encounter for closed fracture: Secondary | ICD-10-CM | POA: Diagnosis not present

## 2017-12-04 DIAGNOSIS — S2221XD Fracture of manubrium, subsequent encounter for fracture with routine healing: Secondary | ICD-10-CM | POA: Diagnosis not present

## 2017-12-04 DIAGNOSIS — C50212 Malignant neoplasm of upper-inner quadrant of left female breast: Secondary | ICD-10-CM | POA: Diagnosis not present

## 2017-12-04 DIAGNOSIS — S22030D Wedge compression fracture of third thoracic vertebra, subsequent encounter for fracture with routine healing: Secondary | ICD-10-CM | POA: Diagnosis not present

## 2017-12-05 DIAGNOSIS — S2221XD Fracture of manubrium, subsequent encounter for fracture with routine healing: Secondary | ICD-10-CM | POA: Diagnosis not present

## 2017-12-05 DIAGNOSIS — C50212 Malignant neoplasm of upper-inner quadrant of left female breast: Secondary | ICD-10-CM | POA: Diagnosis not present

## 2017-12-05 DIAGNOSIS — R2689 Other abnormalities of gait and mobility: Secondary | ICD-10-CM | POA: Diagnosis not present

## 2017-12-05 DIAGNOSIS — S42032D Displaced fracture of lateral end of left clavicle, subsequent encounter for fracture with routine healing: Secondary | ICD-10-CM | POA: Diagnosis not present

## 2017-12-05 DIAGNOSIS — S22030D Wedge compression fracture of third thoracic vertebra, subsequent encounter for fracture with routine healing: Secondary | ICD-10-CM | POA: Diagnosis not present

## 2017-12-05 DIAGNOSIS — S32028D Other fracture of second lumbar vertebra, subsequent encounter for fracture with routine healing: Secondary | ICD-10-CM | POA: Diagnosis not present

## 2017-12-05 DIAGNOSIS — I480 Paroxysmal atrial fibrillation: Secondary | ICD-10-CM | POA: Diagnosis not present

## 2017-12-05 DIAGNOSIS — R278 Other lack of coordination: Secondary | ICD-10-CM | POA: Diagnosis not present

## 2017-12-06 DIAGNOSIS — C50212 Malignant neoplasm of upper-inner quadrant of left female breast: Secondary | ICD-10-CM | POA: Diagnosis not present

## 2017-12-06 DIAGNOSIS — S22030D Wedge compression fracture of third thoracic vertebra, subsequent encounter for fracture with routine healing: Secondary | ICD-10-CM | POA: Diagnosis not present

## 2017-12-06 DIAGNOSIS — S42032D Displaced fracture of lateral end of left clavicle, subsequent encounter for fracture with routine healing: Secondary | ICD-10-CM | POA: Diagnosis not present

## 2017-12-06 DIAGNOSIS — S2221XD Fracture of manubrium, subsequent encounter for fracture with routine healing: Secondary | ICD-10-CM | POA: Diagnosis not present

## 2017-12-06 DIAGNOSIS — S32028D Other fracture of second lumbar vertebra, subsequent encounter for fracture with routine healing: Secondary | ICD-10-CM | POA: Diagnosis not present

## 2017-12-06 DIAGNOSIS — I480 Paroxysmal atrial fibrillation: Secondary | ICD-10-CM | POA: Diagnosis not present

## 2017-12-10 DIAGNOSIS — S42032D Displaced fracture of lateral end of left clavicle, subsequent encounter for fracture with routine healing: Secondary | ICD-10-CM | POA: Diagnosis not present

## 2017-12-10 DIAGNOSIS — I480 Paroxysmal atrial fibrillation: Secondary | ICD-10-CM | POA: Diagnosis not present

## 2017-12-10 DIAGNOSIS — S32028D Other fracture of second lumbar vertebra, subsequent encounter for fracture with routine healing: Secondary | ICD-10-CM | POA: Diagnosis not present

## 2017-12-10 DIAGNOSIS — C50212 Malignant neoplasm of upper-inner quadrant of left female breast: Secondary | ICD-10-CM | POA: Diagnosis not present

## 2017-12-10 DIAGNOSIS — S22030D Wedge compression fracture of third thoracic vertebra, subsequent encounter for fracture with routine healing: Secondary | ICD-10-CM | POA: Diagnosis not present

## 2017-12-10 DIAGNOSIS — S2221XD Fracture of manubrium, subsequent encounter for fracture with routine healing: Secondary | ICD-10-CM | POA: Diagnosis not present

## 2017-12-11 DIAGNOSIS — S2221XD Fracture of manubrium, subsequent encounter for fracture with routine healing: Secondary | ICD-10-CM | POA: Diagnosis not present

## 2017-12-11 DIAGNOSIS — C50212 Malignant neoplasm of upper-inner quadrant of left female breast: Secondary | ICD-10-CM | POA: Diagnosis not present

## 2017-12-11 DIAGNOSIS — M25512 Pain in left shoulder: Secondary | ICD-10-CM | POA: Diagnosis not present

## 2017-12-11 DIAGNOSIS — S42032D Displaced fracture of lateral end of left clavicle, subsequent encounter for fracture with routine healing: Secondary | ICD-10-CM | POA: Diagnosis not present

## 2017-12-11 DIAGNOSIS — I480 Paroxysmal atrial fibrillation: Secondary | ICD-10-CM | POA: Diagnosis not present

## 2017-12-11 DIAGNOSIS — S32028D Other fracture of second lumbar vertebra, subsequent encounter for fracture with routine healing: Secondary | ICD-10-CM | POA: Diagnosis not present

## 2017-12-11 DIAGNOSIS — S22030D Wedge compression fracture of third thoracic vertebra, subsequent encounter for fracture with routine healing: Secondary | ICD-10-CM | POA: Diagnosis not present

## 2017-12-13 DIAGNOSIS — C50212 Malignant neoplasm of upper-inner quadrant of left female breast: Secondary | ICD-10-CM | POA: Diagnosis not present

## 2017-12-13 DIAGNOSIS — S2221XD Fracture of manubrium, subsequent encounter for fracture with routine healing: Secondary | ICD-10-CM | POA: Diagnosis not present

## 2017-12-13 DIAGNOSIS — S22030D Wedge compression fracture of third thoracic vertebra, subsequent encounter for fracture with routine healing: Secondary | ICD-10-CM | POA: Diagnosis not present

## 2017-12-13 DIAGNOSIS — S32028D Other fracture of second lumbar vertebra, subsequent encounter for fracture with routine healing: Secondary | ICD-10-CM | POA: Diagnosis not present

## 2017-12-13 DIAGNOSIS — I480 Paroxysmal atrial fibrillation: Secondary | ICD-10-CM | POA: Diagnosis not present

## 2017-12-13 DIAGNOSIS — S42032D Displaced fracture of lateral end of left clavicle, subsequent encounter for fracture with routine healing: Secondary | ICD-10-CM | POA: Diagnosis not present

## 2017-12-18 DIAGNOSIS — C50212 Malignant neoplasm of upper-inner quadrant of left female breast: Secondary | ICD-10-CM | POA: Diagnosis not present

## 2017-12-18 DIAGNOSIS — S2221XD Fracture of manubrium, subsequent encounter for fracture with routine healing: Secondary | ICD-10-CM | POA: Diagnosis not present

## 2017-12-18 DIAGNOSIS — I480 Paroxysmal atrial fibrillation: Secondary | ICD-10-CM | POA: Diagnosis not present

## 2017-12-18 DIAGNOSIS — S22030D Wedge compression fracture of third thoracic vertebra, subsequent encounter for fracture with routine healing: Secondary | ICD-10-CM | POA: Diagnosis not present

## 2017-12-18 DIAGNOSIS — S42032D Displaced fracture of lateral end of left clavicle, subsequent encounter for fracture with routine healing: Secondary | ICD-10-CM | POA: Diagnosis not present

## 2017-12-18 DIAGNOSIS — S32028D Other fracture of second lumbar vertebra, subsequent encounter for fracture with routine healing: Secondary | ICD-10-CM | POA: Diagnosis not present

## 2017-12-19 DIAGNOSIS — S32028D Other fracture of second lumbar vertebra, subsequent encounter for fracture with routine healing: Secondary | ICD-10-CM | POA: Diagnosis not present

## 2017-12-19 DIAGNOSIS — S2221XD Fracture of manubrium, subsequent encounter for fracture with routine healing: Secondary | ICD-10-CM | POA: Diagnosis not present

## 2017-12-19 DIAGNOSIS — S22030D Wedge compression fracture of third thoracic vertebra, subsequent encounter for fracture with routine healing: Secondary | ICD-10-CM | POA: Diagnosis not present

## 2017-12-19 DIAGNOSIS — I480 Paroxysmal atrial fibrillation: Secondary | ICD-10-CM | POA: Diagnosis not present

## 2017-12-19 DIAGNOSIS — S42032D Displaced fracture of lateral end of left clavicle, subsequent encounter for fracture with routine healing: Secondary | ICD-10-CM | POA: Diagnosis not present

## 2017-12-19 DIAGNOSIS — C50212 Malignant neoplasm of upper-inner quadrant of left female breast: Secondary | ICD-10-CM | POA: Diagnosis not present

## 2017-12-20 DIAGNOSIS — S42032D Displaced fracture of lateral end of left clavicle, subsequent encounter for fracture with routine healing: Secondary | ICD-10-CM | POA: Diagnosis not present

## 2017-12-20 DIAGNOSIS — S32028D Other fracture of second lumbar vertebra, subsequent encounter for fracture with routine healing: Secondary | ICD-10-CM | POA: Diagnosis not present

## 2017-12-20 DIAGNOSIS — S2221XD Fracture of manubrium, subsequent encounter for fracture with routine healing: Secondary | ICD-10-CM | POA: Diagnosis not present

## 2017-12-20 DIAGNOSIS — S22030D Wedge compression fracture of third thoracic vertebra, subsequent encounter for fracture with routine healing: Secondary | ICD-10-CM | POA: Diagnosis not present

## 2017-12-20 DIAGNOSIS — I480 Paroxysmal atrial fibrillation: Secondary | ICD-10-CM | POA: Diagnosis not present

## 2017-12-20 DIAGNOSIS — C50212 Malignant neoplasm of upper-inner quadrant of left female breast: Secondary | ICD-10-CM | POA: Diagnosis not present

## 2017-12-25 DIAGNOSIS — S2221XD Fracture of manubrium, subsequent encounter for fracture with routine healing: Secondary | ICD-10-CM | POA: Diagnosis not present

## 2017-12-25 DIAGNOSIS — I480 Paroxysmal atrial fibrillation: Secondary | ICD-10-CM | POA: Diagnosis not present

## 2017-12-25 DIAGNOSIS — S32028D Other fracture of second lumbar vertebra, subsequent encounter for fracture with routine healing: Secondary | ICD-10-CM | POA: Diagnosis not present

## 2017-12-25 DIAGNOSIS — S22030D Wedge compression fracture of third thoracic vertebra, subsequent encounter for fracture with routine healing: Secondary | ICD-10-CM | POA: Diagnosis not present

## 2017-12-25 DIAGNOSIS — C50212 Malignant neoplasm of upper-inner quadrant of left female breast: Secondary | ICD-10-CM | POA: Diagnosis not present

## 2017-12-25 DIAGNOSIS — S42032D Displaced fracture of lateral end of left clavicle, subsequent encounter for fracture with routine healing: Secondary | ICD-10-CM | POA: Diagnosis not present

## 2017-12-26 DIAGNOSIS — S22030D Wedge compression fracture of third thoracic vertebra, subsequent encounter for fracture with routine healing: Secondary | ICD-10-CM | POA: Diagnosis not present

## 2017-12-26 DIAGNOSIS — S32028D Other fracture of second lumbar vertebra, subsequent encounter for fracture with routine healing: Secondary | ICD-10-CM | POA: Diagnosis not present

## 2017-12-26 DIAGNOSIS — S42032D Displaced fracture of lateral end of left clavicle, subsequent encounter for fracture with routine healing: Secondary | ICD-10-CM | POA: Diagnosis not present

## 2017-12-26 DIAGNOSIS — I480 Paroxysmal atrial fibrillation: Secondary | ICD-10-CM | POA: Diagnosis not present

## 2017-12-26 DIAGNOSIS — S2221XD Fracture of manubrium, subsequent encounter for fracture with routine healing: Secondary | ICD-10-CM | POA: Diagnosis not present

## 2017-12-26 DIAGNOSIS — C50212 Malignant neoplasm of upper-inner quadrant of left female breast: Secondary | ICD-10-CM | POA: Diagnosis not present

## 2018-01-02 DIAGNOSIS — I480 Paroxysmal atrial fibrillation: Secondary | ICD-10-CM | POA: Diagnosis not present

## 2018-01-02 DIAGNOSIS — S32028D Other fracture of second lumbar vertebra, subsequent encounter for fracture with routine healing: Secondary | ICD-10-CM | POA: Diagnosis not present

## 2018-01-02 DIAGNOSIS — C50212 Malignant neoplasm of upper-inner quadrant of left female breast: Secondary | ICD-10-CM | POA: Diagnosis not present

## 2018-01-02 DIAGNOSIS — S42032D Displaced fracture of lateral end of left clavicle, subsequent encounter for fracture with routine healing: Secondary | ICD-10-CM | POA: Diagnosis not present

## 2018-01-02 DIAGNOSIS — S22030D Wedge compression fracture of third thoracic vertebra, subsequent encounter for fracture with routine healing: Secondary | ICD-10-CM | POA: Diagnosis not present

## 2018-01-02 DIAGNOSIS — S2221XD Fracture of manubrium, subsequent encounter for fracture with routine healing: Secondary | ICD-10-CM | POA: Diagnosis not present

## 2018-01-10 DIAGNOSIS — M6281 Muscle weakness (generalized): Secondary | ICD-10-CM | POA: Diagnosis not present

## 2018-01-10 DIAGNOSIS — R269 Unspecified abnormalities of gait and mobility: Secondary | ICD-10-CM | POA: Diagnosis not present

## 2018-01-15 DIAGNOSIS — R269 Unspecified abnormalities of gait and mobility: Secondary | ICD-10-CM | POA: Diagnosis not present

## 2018-01-15 DIAGNOSIS — M6281 Muscle weakness (generalized): Secondary | ICD-10-CM | POA: Diagnosis not present

## 2018-01-18 DIAGNOSIS — R269 Unspecified abnormalities of gait and mobility: Secondary | ICD-10-CM | POA: Diagnosis not present

## 2018-01-18 DIAGNOSIS — M6281 Muscle weakness (generalized): Secondary | ICD-10-CM | POA: Diagnosis not present

## 2018-01-22 DIAGNOSIS — M6281 Muscle weakness (generalized): Secondary | ICD-10-CM | POA: Diagnosis not present

## 2018-01-22 DIAGNOSIS — R269 Unspecified abnormalities of gait and mobility: Secondary | ICD-10-CM | POA: Diagnosis not present

## 2018-01-25 DIAGNOSIS — R269 Unspecified abnormalities of gait and mobility: Secondary | ICD-10-CM | POA: Diagnosis not present

## 2018-01-25 DIAGNOSIS — M6281 Muscle weakness (generalized): Secondary | ICD-10-CM | POA: Diagnosis not present

## 2018-01-29 ENCOUNTER — Encounter: Payer: Self-pay | Admitting: Oncology

## 2018-01-29 ENCOUNTER — Other Ambulatory Visit: Payer: Self-pay | Admitting: Oncology

## 2018-01-29 DIAGNOSIS — R269 Unspecified abnormalities of gait and mobility: Secondary | ICD-10-CM | POA: Diagnosis not present

## 2018-01-29 DIAGNOSIS — M6281 Muscle weakness (generalized): Secondary | ICD-10-CM | POA: Diagnosis not present

## 2018-01-29 DIAGNOSIS — Z9889 Other specified postprocedural states: Secondary | ICD-10-CM

## 2018-02-01 DIAGNOSIS — R269 Unspecified abnormalities of gait and mobility: Secondary | ICD-10-CM | POA: Diagnosis not present

## 2018-02-01 DIAGNOSIS — M6281 Muscle weakness (generalized): Secondary | ICD-10-CM | POA: Diagnosis not present

## 2018-02-05 DIAGNOSIS — R269 Unspecified abnormalities of gait and mobility: Secondary | ICD-10-CM | POA: Diagnosis not present

## 2018-02-05 DIAGNOSIS — M6281 Muscle weakness (generalized): Secondary | ICD-10-CM | POA: Diagnosis not present

## 2018-02-08 DIAGNOSIS — R269 Unspecified abnormalities of gait and mobility: Secondary | ICD-10-CM | POA: Diagnosis not present

## 2018-02-08 DIAGNOSIS — M6281 Muscle weakness (generalized): Secondary | ICD-10-CM | POA: Diagnosis not present

## 2018-02-12 DIAGNOSIS — H401132 Primary open-angle glaucoma, bilateral, moderate stage: Secondary | ICD-10-CM | POA: Diagnosis not present

## 2018-02-14 ENCOUNTER — Ambulatory Visit: Payer: Self-pay | Admitting: Cardiovascular Disease

## 2018-02-26 DIAGNOSIS — Z23 Encounter for immunization: Secondary | ICD-10-CM | POA: Diagnosis not present

## 2018-03-12 ENCOUNTER — Ambulatory Visit
Admission: RE | Admit: 2018-03-12 | Discharge: 2018-03-12 | Disposition: A | Discharge status: Home / Self Care | Payer: Medicare PPO | Source: Ambulatory Visit | Attending: Oncology | Admitting: Oncology

## 2018-03-12 DIAGNOSIS — Z9889 Other specified postprocedural states: Secondary | ICD-10-CM

## 2018-03-12 DIAGNOSIS — Z853 Personal history of malignant neoplasm of breast: Secondary | ICD-10-CM | POA: Diagnosis not present

## 2018-03-12 DIAGNOSIS — R928 Other abnormal and inconclusive findings on diagnostic imaging of breast: Secondary | ICD-10-CM | POA: Diagnosis not present

## 2018-03-27 ENCOUNTER — Ambulatory Visit (INDEPENDENT_AMBULATORY_CARE_PROVIDER_SITE_OTHER): Payer: Medicare PPO | Admitting: Cardiovascular Disease

## 2018-03-27 ENCOUNTER — Encounter: Payer: Self-pay | Admitting: Cardiovascular Disease

## 2018-03-27 VITALS — BP 130/58 | HR 68 | Ht 64.0 in | Wt 156.0 lb

## 2018-03-27 DIAGNOSIS — I4819 Other persistent atrial fibrillation: Secondary | ICD-10-CM | POA: Diagnosis not present
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
## 2018-03-27 NOTE — Patient Instructions (Signed)

## 2018-05-09 DIAGNOSIS — L820 Inflamed seborrheic keratosis: Secondary | ICD-10-CM | POA: Diagnosis not present

## 2018-05-09 DIAGNOSIS — D225 Melanocytic nevi of trunk: Secondary | ICD-10-CM | POA: Diagnosis not present

## 2018-05-09 DIAGNOSIS — L821 Other seborrheic keratosis: Secondary | ICD-10-CM | POA: Diagnosis not present

## 2018-05-09 DIAGNOSIS — Z85828 Personal history of other malignant neoplasm of skin: Secondary | ICD-10-CM | POA: Diagnosis not present

## 2018-05-09 DIAGNOSIS — L570 Actinic keratosis: Secondary | ICD-10-CM | POA: Diagnosis not present

## 2018-06-11 DIAGNOSIS — Z79899 Other long term (current) drug therapy: Secondary | ICD-10-CM | POA: Diagnosis not present

## 2018-06-11 DIAGNOSIS — M810 Age-related osteoporosis without current pathological fracture: Secondary | ICD-10-CM | POA: Diagnosis not present

## 2018-06-11 DIAGNOSIS — Z1389 Encounter for screening for other disorder: Secondary | ICD-10-CM | POA: Diagnosis not present

## 2018-06-11 DIAGNOSIS — I480 Paroxysmal atrial fibrillation: Secondary | ICD-10-CM | POA: Diagnosis not present

## 2018-06-11 DIAGNOSIS — I700 Atherosclerosis of aorta: Secondary | ICD-10-CM | POA: Diagnosis not present

## 2018-06-11 DIAGNOSIS — Z0000 Encounter for general adult medical examination without abnormal findings: Secondary | ICD-10-CM | POA: Diagnosis not present

## 2018-06-11 DIAGNOSIS — C50912 Malignant neoplasm of unspecified site of left female breast: Secondary | ICD-10-CM | POA: Diagnosis not present

## 2018-06-13 DIAGNOSIS — H401122 Primary open-angle glaucoma, left eye, moderate stage: Secondary | ICD-10-CM | POA: Diagnosis not present

## 2018-08-15 ENCOUNTER — Telehealth: Payer: Self-pay | Admitting: Oncology

## 2018-08-15 NOTE — Telephone Encounter (Signed)
R/s appt per 3/25 sch message - unable to reach patient . Left message with appt date and time and sent reminder letter in the mail.

## 2018-08-22 ENCOUNTER — Other Ambulatory Visit: Payer: Self-pay

## 2018-08-22 ENCOUNTER — Ambulatory Visit: Payer: Self-pay | Admitting: Oncology

## 2018-09-07 ENCOUNTER — Other Ambulatory Visit: Payer: Self-pay | Admitting: Physician Assistant

## 2018-09-23 ENCOUNTER — Telehealth: Payer: Self-pay | Admitting: Nurse Practitioner

## 2018-09-23 NOTE — Telephone Encounter (Signed)
Left message on daughter's voice mail asking her to call back to discuss options for patient's upcoming appointment with Dr. Acie Fredrickson on 5/14.

## 2018-09-25 NOTE — Telephone Encounter (Signed)
Left detailed message on patient's voice mail regarding the appointment on May 14 with Dr. Acie Fredrickson and to call back to convert that visit to virtual of if patient his having problems and needs to be seen in person.

## 2018-09-25 NOTE — Telephone Encounter (Signed)
Follow up  ° ° °Patient is returning your call. °

## 2018-09-25 NOTE — Telephone Encounter (Signed)
Please see below message.

## 2018-09-26 NOTE — Telephone Encounter (Signed)
Spoke with pt and she denies experiencing any issues. Pt would rather push her appt out to [place] for in person office visit. Pt was advised to reach out to [place] if anything changes before then.

## 2018-10-01 ENCOUNTER — Other Ambulatory Visit: Payer: Self-pay | Admitting: Physician Assistant

## 2018-10-01 NOTE — Telephone Encounter (Signed)
Pt last saw Dr Acie Fredrickson 03/27/18, last labs 10/07/17 Creat 0.81, age 85, weight 70.8kg, based on specified criteria pt is on appropriate dosage of Eliquis 5mg  BID.  Will refill rx.

## 2018-10-03 ENCOUNTER — Ambulatory Visit: Payer: Medicare PPO | Admitting: Cardiovascular Disease

## 2018-10-15 DIAGNOSIS — H401122 Primary open-angle glaucoma, left eye, moderate stage: Secondary | ICD-10-CM | POA: Diagnosis not present

## 2018-10-15 DIAGNOSIS — H524 Presbyopia: Secondary | ICD-10-CM | POA: Diagnosis not present

## 2018-11-06 ENCOUNTER — Ambulatory Visit: Payer: Medicare PPO

## 2018-11-07 ENCOUNTER — Ambulatory Visit: Payer: Medicare PPO

## 2018-11-08 ENCOUNTER — Ambulatory Visit: Payer: Medicare PPO

## 2018-11-11 ENCOUNTER — Ambulatory Visit: Payer: Medicare PPO

## 2018-11-12 ENCOUNTER — Ambulatory Visit: Payer: Medicare PPO

## 2018-11-13 ENCOUNTER — Ambulatory Visit: Payer: Medicare PPO

## 2018-11-14 ENCOUNTER — Ambulatory Visit: Payer: Medicare PPO

## 2018-11-15 ENCOUNTER — Ambulatory Visit: Payer: Medicare PPO

## 2018-11-18 ENCOUNTER — Ambulatory Visit: Payer: Medicare PPO

## 2018-11-19 ENCOUNTER — Ambulatory Visit: Payer: Medicare PPO

## 2018-11-20 ENCOUNTER — Ambulatory Visit: Payer: Medicare PPO

## 2018-11-21 ENCOUNTER — Ambulatory Visit: Payer: Medicare PPO

## 2018-11-25 ENCOUNTER — Ambulatory Visit: Payer: Medicare PPO

## 2018-11-26 ENCOUNTER — Ambulatory Visit: Payer: Medicare PPO

## 2018-11-27 ENCOUNTER — Ambulatory Visit: Payer: Medicare PPO

## 2018-11-28 ENCOUNTER — Ambulatory Visit: Payer: Medicare PPO

## 2018-11-29 ENCOUNTER — Ambulatory Visit: Payer: Medicare PPO

## 2018-12-02 ENCOUNTER — Ambulatory Visit: Payer: Medicare PPO

## 2018-12-03 ENCOUNTER — Ambulatory Visit: Payer: Medicare PPO

## 2018-12-04 ENCOUNTER — Ambulatory Visit: Payer: Medicare PPO

## 2018-12-05 ENCOUNTER — Ambulatory Visit: Payer: Medicare PPO

## 2018-12-06 ENCOUNTER — Ambulatory Visit: Payer: Medicare PPO

## 2018-12-09 ENCOUNTER — Ambulatory Visit: Payer: Medicare PPO

## 2018-12-10 ENCOUNTER — Ambulatory Visit: Payer: Medicare PPO

## 2018-12-11 ENCOUNTER — Ambulatory Visit: Payer: Medicare PPO

## 2018-12-12 ENCOUNTER — Ambulatory Visit: Payer: Medicare PPO

## 2018-12-13 ENCOUNTER — Ambulatory Visit: Payer: Medicare PPO

## 2018-12-16 ENCOUNTER — Ambulatory Visit: Payer: Medicare PPO

## 2018-12-17 ENCOUNTER — Ambulatory Visit: Payer: Medicare PPO

## 2018-12-18 ENCOUNTER — Ambulatory Visit: Payer: Medicare PPO

## 2018-12-19 ENCOUNTER — Ambulatory Visit: Payer: Medicare PPO

## 2018-12-20 ENCOUNTER — Ambulatory Visit: Payer: Medicare PPO

## 2018-12-23 ENCOUNTER — Ambulatory Visit: Payer: Medicare PPO
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
## 2019-01-14 ENCOUNTER — Ambulatory Visit: Payer: Medicare PPO | Admitting: Cardiovascular Disease

## 2019-01-24 ENCOUNTER — Telehealth: Payer: Self-pay | Admitting: Oncology

## 2019-01-24 NOTE — Telephone Encounter (Signed)
Returned patient's phone call regarding rescheduling an appointment, left a voicemail. 

## 2019-01-30 DIAGNOSIS — I480 Paroxysmal atrial fibrillation: Secondary | ICD-10-CM | POA: Diagnosis not present

## 2019-01-30 DIAGNOSIS — D6869 Other thrombophilia: Secondary | ICD-10-CM | POA: Diagnosis not present

## 2019-01-30 DIAGNOSIS — Z23 Encounter for immunization: Secondary | ICD-10-CM | POA: Diagnosis not present

## 2019-02-03 DIAGNOSIS — W57XXXA Bitten or stung by nonvenomous insect and other nonvenomous arthropods, initial encounter: Secondary | ICD-10-CM | POA: Diagnosis not present

## 2019-02-03 DIAGNOSIS — S80261A Insect bite (nonvenomous), right knee, initial encounter: Secondary | ICD-10-CM | POA: Diagnosis not present

## 2019-02-03 DIAGNOSIS — D6869 Other thrombophilia: Secondary | ICD-10-CM | POA: Diagnosis not present

## 2019-02-06 DIAGNOSIS — D1801 Hemangioma of skin and subcutaneous tissue: Secondary | ICD-10-CM | POA: Diagnosis not present

## 2019-02-06 DIAGNOSIS — D2271 Melanocytic nevi of right lower limb, including hip: Secondary | ICD-10-CM | POA: Diagnosis not present

## 2019-02-06 DIAGNOSIS — L821 Other seborrheic keratosis: Secondary | ICD-10-CM | POA: Diagnosis not present

## 2019-02-06 DIAGNOSIS — D2262 Melanocytic nevi of left upper limb, including shoulder: Secondary | ICD-10-CM | POA: Diagnosis not present

## 2019-02-06 DIAGNOSIS — D2272 Melanocytic nevi of left lower limb, including hip: Secondary | ICD-10-CM | POA: Diagnosis not present

## 2019-02-06 DIAGNOSIS — S80861A Insect bite (nonvenomous), right lower leg, initial encounter: Secondary | ICD-10-CM | POA: Diagnosis not present

## 2019-02-06 DIAGNOSIS — Z85828 Personal history of other malignant neoplasm of skin: Secondary | ICD-10-CM | POA: Diagnosis not present

## 2019-02-06 DIAGNOSIS — D225 Melanocytic nevi of trunk: Secondary | ICD-10-CM | POA: Diagnosis not present

## 2019-02-10 ENCOUNTER — Other Ambulatory Visit: Payer: Self-pay | Admitting: Oncology

## 2019-02-10 DIAGNOSIS — Z9889 Other specified postprocedural states: Secondary | ICD-10-CM

## 2019-02-17 ENCOUNTER — Other Ambulatory Visit: Payer: Self-pay | Admitting: *Deleted

## 2019-02-17 DIAGNOSIS — C50212 Malignant neoplasm of upper-inner quadrant of left female breast: Secondary | ICD-10-CM

## 2019-02-17 DIAGNOSIS — H401132 Primary open-angle glaucoma, bilateral, moderate stage: Secondary | ICD-10-CM | POA: Diagnosis not present

## 2019-02-18 ENCOUNTER — Inpatient Hospital Stay: Payer: Medicare PPO | Attending: Oncology

## 2019-02-18 ENCOUNTER — Other Ambulatory Visit: Payer: Self-pay

## 2019-02-18 ENCOUNTER — Inpatient Hospital Stay: Payer: Medicare PPO | Admitting: Oncology

## 2019-02-18 VITALS — BP 163/56 | HR 67 | Temp 98.6°F | Resp 18 | Wt 155.3 lb

## 2019-02-18 DIAGNOSIS — Z7901 Long term (current) use of anticoagulants: Secondary | ICD-10-CM | POA: Diagnosis not present

## 2019-02-18 DIAGNOSIS — C50312 Malignant neoplasm of lower-inner quadrant of left female breast: Secondary | ICD-10-CM | POA: Diagnosis not present

## 2019-02-18 DIAGNOSIS — Z170 Estrogen receptor positive status [ER+]: Secondary | ICD-10-CM | POA: Insufficient documentation

## 2019-02-18 DIAGNOSIS — Z79811 Long term (current) use of aromatase inhibitors: Secondary | ICD-10-CM | POA: Insufficient documentation

## 2019-02-18 DIAGNOSIS — I10 Essential (primary) hypertension: Secondary | ICD-10-CM | POA: Diagnosis not present

## 2019-02-18 DIAGNOSIS — C50212 Malignant neoplasm of upper-inner quadrant of left female breast: Secondary | ICD-10-CM | POA: Diagnosis not present

## 2019-02-18 LAB — CBC WITH DIFFERENTIAL (CANCER CENTER ONLY)
Abs Immature Granulocytes: 0.03 10*3/uL (ref 0.00–0.07)
Basophils Absolute: 0.1 10*3/uL (ref 0.0–0.1)
Basophils Relative: 1 %
Eosinophils Absolute: 0.5 10*3/uL (ref 0.0–0.5)
Eosinophils Relative: 6 %
HCT: 39.6 % (ref 36.0–46.0)
Hemoglobin: 12.8 g/dL (ref 12.0–15.0)
Immature Granulocytes: 0 %
Lymphocytes Relative: 25 %
Lymphs Abs: 2 10*3/uL (ref 0.7–4.0)
MCH: 30 pg (ref 26.0–34.0)
MCHC: 32.3 g/dL (ref 30.0–36.0)
MCV: 92.7 fL (ref 80.0–100.0)
Monocytes Absolute: 0.6 10*3/uL (ref 0.1–1.0)
Monocytes Relative: 7 %
Neutro Abs: 5.1 10*3/uL (ref 1.7–7.7)
Neutrophils Relative %: 61 %
Platelet Count: 275 10*3/uL (ref 150–400)
RBC: 4.27 MIL/uL (ref 3.87–5.11)
RDW: 14.1 % (ref 11.5–15.5)
WBC Count: 8.3 10*3/uL (ref 4.0–10.5)
nRBC: 0 % (ref 0.0–0.2)

## 2019-02-18 LAB — CMP (CANCER CENTER ONLY)
ALT: 16 U/L (ref 0–44)
AST: 19 U/L (ref 15–41)
Albumin: 4.3 g/dL (ref 3.5–5.0)
Alkaline Phosphatase: 79 U/L (ref 38–126)
Anion gap: 9 (ref 5–15)
BUN: 20 mg/dL (ref 8–23)
CO2: 29 mmol/L (ref 22–32)
Calcium: 10 mg/dL (ref 8.9–10.3)
Chloride: 105 mmol/L (ref 98–111)
Creatinine: 0.96 mg/dL (ref 0.44–1.00)
GFR, Est AFR Am: >60 mL/min (ref >60)
GFR, Estimated: 53 mL/min — ABNORMAL LOW (ref >60)
Glucose, Bld: 86 mg/dL (ref 70–99)
Potassium: 4.2 mmol/L (ref 3.5–5.1)
Sodium: 143 mmol/L (ref 135–145)
Total Bilirubin: 0.4 mg/dL (ref 0.3–1.2)
Total Protein: 6.7 g/dL (ref 6.5–8.1)
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
## 2019-02-27 ENCOUNTER — Ambulatory Visit: Payer: Medicare PPO | Admitting: Cardiovascular Disease

## 2019-02-27 ENCOUNTER — Other Ambulatory Visit: Payer: Self-pay

## 2019-02-27 ENCOUNTER — Encounter: Payer: Self-pay | Admitting: Cardiovascular Disease

## 2019-02-27 VITALS — BP 140/78 | HR 73 | Ht 67.0 in | Wt 155.0 lb

## 2019-02-27 DIAGNOSIS — I4819 Other persistent atrial fibrillation: Secondary | ICD-10-CM

## 2019-02-27 DIAGNOSIS — I10 Essential (primary) hypertension: Secondary | ICD-10-CM | POA: Diagnosis not present

## 2019-02-27 DIAGNOSIS — I480 Paroxysmal atrial fibrillation: Secondary | ICD-10-CM | POA: Diagnosis not present
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
## 2019-02-27 NOTE — Patient Instructions (Signed)

## 2019-03-14 ENCOUNTER — Other Ambulatory Visit: Payer: Self-pay

## 2019-03-14 ENCOUNTER — Ambulatory Visit
Admission: RE | Admit: 2019-03-14 | Discharge: 2019-03-14 | Disposition: A | Discharge status: Home / Self Care | Payer: Medicare PPO | Source: Ambulatory Visit | Attending: Oncology | Admitting: Oncology

## 2019-03-14 DIAGNOSIS — R928 Other abnormal and inconclusive findings on diagnostic imaging of breast: Secondary | ICD-10-CM | POA: Diagnosis not present

## 2019-03-14 DIAGNOSIS — Z853 Personal history of malignant neoplasm of breast: Secondary | ICD-10-CM | POA: Diagnosis not present

## 2019-03-14 DIAGNOSIS — Z9889 Other specified postprocedural states: Secondary | ICD-10-CM

## 2019-06-05 ENCOUNTER — Other Ambulatory Visit: Payer: Medicare PPO

## 2019-06-17 ENCOUNTER — Ambulatory Visit: Payer: Medicare PPO

## 2019-06-17 DIAGNOSIS — D6869 Other thrombophilia: Secondary | ICD-10-CM | POA: Diagnosis not present

## 2019-06-17 DIAGNOSIS — H903 Sensorineural hearing loss, bilateral: Secondary | ICD-10-CM | POA: Diagnosis not present

## 2019-06-17 DIAGNOSIS — I700 Atherosclerosis of aorta: Secondary | ICD-10-CM | POA: Diagnosis not present

## 2019-06-17 DIAGNOSIS — I480 Paroxysmal atrial fibrillation: Secondary | ICD-10-CM | POA: Diagnosis not present

## 2019-06-17 DIAGNOSIS — Z79899 Other long term (current) drug therapy: Secondary | ICD-10-CM | POA: Diagnosis not present

## 2019-06-17 DIAGNOSIS — Z0000 Encounter for general adult medical examination without abnormal findings: Secondary | ICD-10-CM | POA: Diagnosis not present

## 2019-06-17 DIAGNOSIS — Z1389 Encounter for screening for other disorder: Secondary | ICD-10-CM | POA: Diagnosis not present

## 2019-06-17 DIAGNOSIS — E041 Nontoxic single thyroid nodule: Secondary | ICD-10-CM | POA: Diagnosis not present

## 2019-07-07 DIAGNOSIS — H90A21 Sensorineural hearing loss, unilateral, right ear, with restricted hearing on the contralateral side: Secondary | ICD-10-CM | POA: Diagnosis not present

## 2019-07-07 DIAGNOSIS — H90A32 Mixed conductive and sensorineural hearing loss, unilateral, left ear with restricted hearing on the contralateral side: Secondary | ICD-10-CM | POA: Diagnosis not present

## 2019-07-20 ENCOUNTER — Encounter: Payer: Self-pay | Admitting: Cardiovascular Disease
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
## 2019-07-21 ENCOUNTER — Other Ambulatory Visit: Payer: Self-pay

## 2019-07-21 ENCOUNTER — Encounter: Payer: Self-pay | Admitting: Cardiovascular Disease

## 2019-07-21 ENCOUNTER — Ambulatory Visit: Payer: Medicare PPO | Admitting: Cardiovascular Disease

## 2019-07-21 VITALS — BP 124/52 | HR 70 | Ht 67.0 in | Wt 154.0 lb

## 2019-07-21 DIAGNOSIS — I480 Paroxysmal atrial fibrillation: Secondary | ICD-10-CM | POA: Diagnosis not present

## 2019-07-21 NOTE — Patient Instructions (Signed)
Medication Instructions:  Your physician recommends that you continue on your current medications as directed. Please refer to the Current Medication list given to you today.  *If you need a refill on your cardiac medications before your next appointment, please call your pharmacy*   Lab Work: None Ordered If you have labs (blood work) drawn today and your tests are completely normal, you will receive your results only by: Marland Kitchen MyChart Message (if you have MyChart) OR . A paper copy in the mail If you have any lab test that is abnormal or we need to change your treatment, we will call you to review the results.   Testing/Procedures: None Ordered   Follow-Up: At [facility], you and your health needs are our priority.  As part of our continuing mission to provide you with exceptional heart care, we have created designated Provider Care Teams.  These Care Teams include your primary Cardiologist (physician) and Advanced Practice Providers (APPs -  Physician Assistants and Nurse Practitioners) who all work together to provide you with the care you need, when you need it.   Your next appointment:   6 month(s)  The format for your next appointment:   Either In Person or Virtual  Provider:   You may see Mertie Moores, MD or one of the following Advanced Practice Providers on your designated Care Team:    Richardson Dopp, PA-C  [place], [place]  Daune Perch, [place]

## 2019-07-28 ENCOUNTER — Other Ambulatory Visit: Payer: Self-pay | Admitting: Physician Assistant

## 2019-08-19 DIAGNOSIS — H401132 Primary open-angle glaucoma, bilateral, moderate stage: Secondary | ICD-10-CM | POA: Diagnosis not present

## 2019-08-21 ENCOUNTER — Ambulatory Visit
Admission: RE | Admit: 2019-08-21 | Discharge: 2019-08-21 | Disposition: A | Discharge status: Home / Self Care | Payer: Medicare PPO | Source: Ambulatory Visit | Attending: Oncology | Admitting: Oncology

## 2019-08-21 ENCOUNTER — Other Ambulatory Visit: Payer: Self-pay

## 2019-08-21 DIAGNOSIS — Z170 Estrogen receptor positive status [ER+]: Secondary | ICD-10-CM

## 2019-08-21 DIAGNOSIS — Z780 Asymptomatic menopausal state: Secondary | ICD-10-CM | POA: Diagnosis not present

## 2019-08-21 DIAGNOSIS — C50212 Malignant neoplasm of upper-inner quadrant of left female breast: Secondary | ICD-10-CM

## 2019-08-21 DIAGNOSIS — M810 Age-related osteoporosis without current pathological fracture: Secondary | ICD-10-CM | POA: Diagnosis not present

## 2019-09-04 DIAGNOSIS — R350 Frequency of micturition: Secondary | ICD-10-CM | POA: Diagnosis not present

## 2019-09-04 DIAGNOSIS — N390 Urinary tract infection, site not specified: Secondary | ICD-10-CM | POA: Diagnosis not present

## 2019-09-10 DIAGNOSIS — R350 Frequency of micturition: Secondary | ICD-10-CM | POA: Diagnosis not present

## 2019-12-10 ENCOUNTER — Telehealth: Payer: Self-pay | Admitting: Cardiovascular Disease

## 2019-12-10 NOTE — Telephone Encounter (Signed)
I spoke with patient's daughter. She reports patient just recently started back at swim [facility].  Reading today was 168/high 70's.  One other day it was in the 160's.   Had not been checking BP until recently. She did check BP at home today and it was 160/80. Patient is feeling well. I asked patient's daughter to check BP and heart rate daily about 1-2 hours after taking morning medications and keep record of readings.  Can also check in the afternoon if she would like.  I asked her to call readings to the office in about a week

## 2019-12-10 NOTE — Telephone Encounter (Signed)
Pt c/o BP issue: STAT if pt c/o blurred vision, one-sided weakness or slurred speech  1. What are your last 5 BP readings? 160/70's  2. Are you having any other symptoms (ex. Dizziness, headache, blurred vision, passed out)? no  3. What is your BP issue? Patient's daughter states the patient is going to swim aerobics and noticed her BP has been around 160/70's. Please advise

## 2019-12-11 NOTE — Telephone Encounter (Signed)
I spoke with patient's daughter and discussed information from Dr Acie Fredrickson with her.  She states patient realized yesterday she had increased salt intake recently--cheese and crackers.  She will cut back on salt intake.   [facility] will [facility] and will also check at home and contact office with readings.

## 2019-12-11 NOTE — Telephone Encounter (Signed)
Her BP has been well controlled in the past Is she eating more salty foods ?    Agree with keeping a BP log We will review the BP log in several weeks

## 2020-01-07 DIAGNOSIS — R040 Epistaxis: Secondary | ICD-10-CM | POA: Diagnosis not present

## 2020-01-07 DIAGNOSIS — T148XXA Other injury of unspecified body region, initial encounter: Secondary | ICD-10-CM | POA: Diagnosis not present

## 2020-01-20 DIAGNOSIS — H5203 Hypermetropia, bilateral: Secondary | ICD-10-CM | POA: Diagnosis not present

## 2020-01-20 DIAGNOSIS — H401132 Primary open-angle glaucoma, bilateral, moderate stage: Secondary | ICD-10-CM | POA: Diagnosis not present

## 2020-02-03 ENCOUNTER — Ambulatory Visit: Payer: Medicare PPO | Admitting: Cardiovascular Disease

## 2020-02-03 DIAGNOSIS — R040 Epistaxis: Secondary | ICD-10-CM | POA: Diagnosis not present

## 2020-02-08 ENCOUNTER — Encounter: Payer: Self-pay | Admitting: Cardiovascular Disease
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
## 2020-02-09 ENCOUNTER — Encounter: Payer: Self-pay | Admitting: Cardiovascular Disease

## 2020-02-09 ENCOUNTER — Other Ambulatory Visit: Payer: Self-pay

## 2020-02-09 ENCOUNTER — Ambulatory Visit: Payer: Medicare PPO | Admitting: Cardiovascular Disease

## 2020-02-09 VITALS — BP 138/68 | HR 68 | Ht 67.0 in | Wt 156.8 lb

## 2020-02-09 DIAGNOSIS — I10 Essential (primary) hypertension: Secondary | ICD-10-CM

## 2020-02-09 DIAGNOSIS — I480 Paroxysmal atrial fibrillation: Secondary | ICD-10-CM

## 2020-02-09 MED ORDER — HYDROCHLOROTHIAZIDE 25 MG PO TABS: 25.0000 mg | ORAL_TABLET | Freq: Every day | ORAL | 3 refills | Status: DC

## 2020-02-09 MED ORDER — POTASSIUM CHLORIDE ER 10 MEQ PO TBCR: 10.0000 meq | EXTENDED_RELEASE_TABLET | Freq: Every day | ORAL | 3 refills | Status: DC

## 2020-02-09 NOTE — Patient Instructions (Signed)
Medication Instructions:  1) START HCTZ (hydrochlorothiazide) 25 mg daily 2) START KDUR 10 meq daily *If you need a refill on your cardiac medications before your next appointment, please call your pharmacy*  Lab Work: Your provider recommends that you return for lab work in 3 weeks. If you have labs (blood work) drawn today and your tests are completely normal, you will receive your results only by: Marland Kitchen MyChart Message (if you have MyChart) OR . A paper copy in the mail If you have any lab test that is abnormal or we need to change your treatment, we will call you to review the results.  Follow-Up: Your provider recommends that you schedule a follow-up appointment in 3 months with Dr. Acie Fredrickson.

## 2020-02-23 ENCOUNTER — Other Ambulatory Visit: Payer: Self-pay | Admitting: Geriatric Medicine

## 2020-02-23 ENCOUNTER — Other Ambulatory Visit: Payer: Self-pay | Admitting: Oncology

## 2020-02-23 DIAGNOSIS — Z9889 Other specified postprocedural states: Secondary | ICD-10-CM

## 2020-03-02 ENCOUNTER — Other Ambulatory Visit: Payer: Medicare PPO | Admitting: *Deleted

## 2020-03-02 ENCOUNTER — Other Ambulatory Visit: Payer: Self-pay

## 2020-03-02 DIAGNOSIS — I480 Paroxysmal atrial fibrillation: Secondary | ICD-10-CM

## 2020-03-03 LAB — BASIC METABOLIC PANEL
BUN/Creatinine Ratio: 22 (ref 12–28)
BUN: 17 mg/dL (ref 8–27)
CO2: 27 mmol/L (ref 20–29)
Calcium: 9.6 mg/dL (ref 8.7–10.3)
Chloride: 102 mmol/L (ref 96–106)
Creatinine, Ser: 0.76 mg/dL (ref 0.57–1.00)
GFR calc Af Amer: 81 mL/min/{1.73_m2} (ref >59)
GFR calc non Af Amer: 70 mL/min/{1.73_m2} (ref >59)
Glucose: 93 mg/dL (ref 65–99)
Potassium: 4 mmol/L (ref 3.5–5.2)
Sodium: 141 mmol/L (ref 134–144)

## 2020-03-16 ENCOUNTER — Other Ambulatory Visit: Payer: Self-pay

## 2020-03-16 ENCOUNTER — Ambulatory Visit
Admission: RE | Admit: 2020-03-16 | Discharge: 2020-03-16 | Disposition: A | Discharge status: Home / Self Care | Payer: Medicare PPO | Source: Ambulatory Visit | Attending: Geriatric Medicine | Admitting: Geriatric Medicine

## 2020-03-16 DIAGNOSIS — Z9889 Other specified postprocedural states: Secondary | ICD-10-CM

## 2020-03-16 DIAGNOSIS — R928 Other abnormal and inconclusive findings on diagnostic imaging of breast: Secondary | ICD-10-CM | POA: Diagnosis not present

## 2020-04-14 DIAGNOSIS — Z23 Encounter for immunization: Secondary | ICD-10-CM | POA: Diagnosis not present

## 2020-05-02 ENCOUNTER — Encounter: Payer: Self-pay | Admitting: Cardiovascular Disease
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
## 2020-05-03 ENCOUNTER — Other Ambulatory Visit: Payer: Self-pay

## 2020-05-03 ENCOUNTER — Ambulatory Visit: Payer: Medicare PPO | Admitting: Cardiovascular Disease

## 2020-05-03 ENCOUNTER — Encounter: Payer: Self-pay | Admitting: Cardiovascular Disease

## 2020-05-03 VITALS — BP 104/58 | HR 70 | Ht 67.0 in | Wt 153.8 lb

## 2020-05-03 DIAGNOSIS — I10 Essential (primary) hypertension: Secondary | ICD-10-CM

## 2020-05-03 DIAGNOSIS — I480 Paroxysmal atrial fibrillation: Secondary | ICD-10-CM

## 2020-05-03 NOTE — Patient Instructions (Signed)
Medication Instructions:  Your physician recommends that you continue on your current medications as directed. Please refer to the Current Medication list given to you today.  *If you need a refill on your cardiac medications before your next appointment, please call your pharmacy*   Lab Work: None Ordered If you have labs (blood work) drawn today and your tests are completely normal, you will receive your results only by: Marland Kitchen MyChart Message (if you have MyChart) OR . A paper copy in the mail If you have any lab test that is abnormal or we need to change your treatment, we will call you to review the results.   Testing/Procedures: None Ordered   Follow-Up: At [facility], you and your health needs are our priority.  As part of our continuing mission to provide you with exceptional heart care, we have created designated Provider Care Teams.  These Care Teams include your primary Cardiologist (physician) and Advanced Practice Providers (APPs -  Physician Assistants and Nurse Practitioners) who all work together to provide you with the care you need, when you need it.  Your next appointment:   6 month(s) on June 13 at 1:20 pm with Dr. Acie Fredrickson  The format for your next appointment:   In Person  Provider:   You may see Mertie Moores, MD or one of the following Advanced Practice Providers on your designated Care Team:    Richardson Dopp, PA-C  [place], [place]

## 2020-05-28 DIAGNOSIS — H401122 Primary open-angle glaucoma, left eye, moderate stage: Secondary | ICD-10-CM | POA: Diagnosis not present

## 2020-06-22 DIAGNOSIS — R739 Hyperglycemia, unspecified: Secondary | ICD-10-CM | POA: Diagnosis not present

## 2020-06-22 DIAGNOSIS — I480 Paroxysmal atrial fibrillation: Secondary | ICD-10-CM | POA: Diagnosis not present

## 2020-06-22 DIAGNOSIS — E7800 Pure hypercholesterolemia, unspecified: Secondary | ICD-10-CM | POA: Diagnosis not present

## 2020-06-22 DIAGNOSIS — Z79899 Other long term (current) drug therapy: Secondary | ICD-10-CM | POA: Diagnosis not present

## 2020-06-22 DIAGNOSIS — D6869 Other thrombophilia: Secondary | ICD-10-CM | POA: Diagnosis not present

## 2020-06-22 DIAGNOSIS — I700 Atherosclerosis of aorta: Secondary | ICD-10-CM | POA: Diagnosis not present

## 2020-06-22 DIAGNOSIS — I10 Essential (primary) hypertension: Secondary | ICD-10-CM | POA: Diagnosis not present

## 2020-07-12 ENCOUNTER — Other Ambulatory Visit: Payer: Self-pay | Admitting: Cardiovascular Disease

## 2020-07-12 NOTE — Telephone Encounter (Signed)
Eliquis 5mg  refill request received. Patient is 87 years old, weight-69.8kg, Crea-0.76 on 03/02/2020, Diagnosis-Afib, and last seen by Dr. Acie Fredrickson on 05/03/2020. Dose is appropriate based on dosing criteria. Will send in refill to requested pharmacy.

## 2020-08-17 DIAGNOSIS — J029 Acute pharyngitis, unspecified: Secondary | ICD-10-CM | POA: Diagnosis not present

## 2020-08-17 DIAGNOSIS — Z03818 Encounter for observation for suspected exposure to other biological agents ruled out: Secondary | ICD-10-CM | POA: Diagnosis not present

## 2020-09-14 DIAGNOSIS — H401122 Primary open-angle glaucoma, left eye, moderate stage: Secondary | ICD-10-CM | POA: Diagnosis not present

## 2020-10-29 ENCOUNTER — Other Ambulatory Visit: Payer: Self-pay | Admitting: Physician Assistant

## 2020-11-01 ENCOUNTER — Ambulatory Visit: Payer: Medicare PPO | Admitting: Cardiovascular Disease

## 2020-11-02 DIAGNOSIS — L218 Other seborrheic dermatitis: Secondary | ICD-10-CM | POA: Diagnosis not present

## 2020-11-02 DIAGNOSIS — L905 Scar conditions and fibrosis of skin: Secondary | ICD-10-CM | POA: Diagnosis not present

## 2020-11-02 DIAGNOSIS — L570 Actinic keratosis: Secondary | ICD-10-CM | POA: Diagnosis not present

## 2020-11-02 DIAGNOSIS — D1801 Hemangioma of skin and subcutaneous tissue: Secondary | ICD-10-CM | POA: Diagnosis not present

## 2020-11-02 DIAGNOSIS — L814 Other melanin hyperpigmentation: Secondary | ICD-10-CM | POA: Diagnosis not present

## 2020-11-02 DIAGNOSIS — D225 Melanocytic nevi of trunk: Secondary | ICD-10-CM | POA: Diagnosis not present

## 2020-11-02 DIAGNOSIS — Z85828 Personal history of other malignant neoplasm of skin: Secondary | ICD-10-CM | POA: Diagnosis not present

## 2020-11-02 DIAGNOSIS — L308 Other specified dermatitis: Secondary | ICD-10-CM | POA: Diagnosis not present

## 2020-11-02 DIAGNOSIS — L821 Other seborrheic keratosis: Secondary | ICD-10-CM | POA: Diagnosis not present

## 2020-11-09 ENCOUNTER — Encounter: Payer: Self-pay | Admitting: Cardiovascular Disease
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
## 2020-11-10 ENCOUNTER — Other Ambulatory Visit: Payer: Self-pay

## 2020-11-10 ENCOUNTER — Encounter: Payer: Self-pay | Admitting: Cardiovascular Disease

## 2020-11-10 ENCOUNTER — Ambulatory Visit: Payer: Medicare PPO | Admitting: Cardiovascular Disease

## 2020-11-10 VITALS — BP 126/74 | HR 62 | Ht 67.0 in | Wt 155.2 lb

## 2020-11-10 DIAGNOSIS — I480 Paroxysmal atrial fibrillation: Secondary | ICD-10-CM

## 2020-11-10 NOTE — Patient Instructions (Signed)
Medication Instructions:  Your physician recommends that you continue on your current medications as directed. Please refer to the Current Medication list given to you today.  *If you need a refill on your cardiac medications before your next appointment, please call your pharmacy*   Lab Work: none  Testing/Procedures: none   Follow-Up: At [facility], you and your health needs are our priority.  As part of our continuing mission to provide you with exceptional heart care, we have created designated Provider Care Teams.  These Care Teams include your primary Cardiologist (physician) and Advanced Practice Providers (APPs -  Physician Assistants and Nurse Practitioners) who all work together to provide you with the care you need, when you need it.  We recommend signing up for the patient portal called "MyChart".  Sign up information is provided on this After Visit Summary.  MyChart is used to connect with patients for Virtual Visits (Telemedicine).  Patients are able to view lab/test results, encounter notes, upcoming appointments, etc.  Non-urgent messages can be sent to your provider as well.   To learn more about what you can do with MyChart, go to [email or web address].    Your next appointment:   1 year(s)  The format for your next appointment:   In Person  Provider:   You may see Mertie Moores, MD or one of the following Advanced Practice Providers on your designated Care Team:   Richardson Dopp, PA-C [place], [place]

## 2021-01-04 DIAGNOSIS — I480 Paroxysmal atrial fibrillation: Secondary | ICD-10-CM | POA: Diagnosis not present

## 2021-01-18 DIAGNOSIS — H401122 Primary open-angle glaucoma, left eye, moderate stage: Secondary | ICD-10-CM | POA: Diagnosis not present

## 2021-02-21 ENCOUNTER — Other Ambulatory Visit: Payer: Self-pay | Admitting: Geriatric Medicine

## 2021-02-21 DIAGNOSIS — Z1231 Encounter for screening mammogram for malignant neoplasm of breast: Secondary | ICD-10-CM

## 2021-03-22 ENCOUNTER — Other Ambulatory Visit: Payer: Self-pay

## 2021-03-22 ENCOUNTER — Ambulatory Visit
Admission: RE | Admit: 2021-03-22 | Discharge: 2021-03-22 | Disposition: A | Discharge status: Home / Self Care | Payer: Medicare PPO | Source: Ambulatory Visit | Attending: Geriatric Medicine | Admitting: Geriatric Medicine

## 2021-03-22 DIAGNOSIS — Z1231 Encounter for screening mammogram for malignant neoplasm of breast: Secondary | ICD-10-CM

## 2021-04-18 DIAGNOSIS — R300 Dysuria: Secondary | ICD-10-CM | POA: Diagnosis not present

## 2021-05-04 DIAGNOSIS — R300 Dysuria: Secondary | ICD-10-CM | POA: Diagnosis not present

## 2021-05-27 DIAGNOSIS — N813 Complete uterovaginal prolapse: Secondary | ICD-10-CM | POA: Diagnosis not present

## 2021-05-27 DIAGNOSIS — Z6825 Body mass index (BMI) 25.0-25.9, adult: Secondary | ICD-10-CM | POA: Diagnosis not present

## 2021-05-27 DIAGNOSIS — N814 Uterovaginal prolapse, unspecified: Secondary | ICD-10-CM | POA: Diagnosis not present

## 2021-06-09 DIAGNOSIS — N814 Uterovaginal prolapse, unspecified: Secondary | ICD-10-CM | POA: Diagnosis not present

## 2021-06-09 DIAGNOSIS — N939 Abnormal uterine and vaginal bleeding, unspecified: Secondary | ICD-10-CM | POA: Diagnosis not present

## 2021-06-27 DIAGNOSIS — N814 Uterovaginal prolapse, unspecified: Secondary | ICD-10-CM | POA: Diagnosis not present

## 2021-06-27 DIAGNOSIS — Z79899 Other long term (current) drug therapy: Secondary | ICD-10-CM | POA: Diagnosis not present

## 2021-06-27 DIAGNOSIS — Z0000 Encounter for general adult medical examination without abnormal findings: Secondary | ICD-10-CM | POA: Diagnosis not present

## 2021-06-27 DIAGNOSIS — E7800 Pure hypercholesterolemia, unspecified: Secondary | ICD-10-CM | POA: Diagnosis not present

## 2021-06-27 DIAGNOSIS — Z23 Encounter for immunization: Secondary | ICD-10-CM | POA: Diagnosis not present

## 2021-06-27 DIAGNOSIS — I10 Essential (primary) hypertension: Secondary | ICD-10-CM | POA: Diagnosis not present

## 2021-06-27 DIAGNOSIS — I700 Atherosclerosis of aorta: Secondary | ICD-10-CM | POA: Diagnosis not present

## 2021-06-27 DIAGNOSIS — E041 Nontoxic single thyroid nodule: Secondary | ICD-10-CM | POA: Diagnosis not present

## 2021-06-27 DIAGNOSIS — Z1389 Encounter for screening for other disorder: Secondary | ICD-10-CM | POA: Diagnosis not present

## 2021-06-27 DIAGNOSIS — D6869 Other thrombophilia: Secondary | ICD-10-CM | POA: Diagnosis not present

## 2021-06-27 DIAGNOSIS — I480 Paroxysmal atrial fibrillation: Secondary | ICD-10-CM | POA: Diagnosis not present

## 2021-07-07 DIAGNOSIS — N814 Uterovaginal prolapse, unspecified: Secondary | ICD-10-CM | POA: Diagnosis not present

## 2021-07-26 DIAGNOSIS — H401122 Primary open-angle glaucoma, left eye, moderate stage: Secondary | ICD-10-CM | POA: Diagnosis not present

## 2021-07-26 DIAGNOSIS — H2513 Age-related nuclear cataract, bilateral: Secondary | ICD-10-CM | POA: Diagnosis not present

## 2021-10-31 ENCOUNTER — Other Ambulatory Visit: Payer: Self-pay | Admitting: Cardiovascular Disease

## 2021-10-31 DIAGNOSIS — I480 Paroxysmal atrial fibrillation: Secondary | ICD-10-CM

## 2021-10-31 NOTE — Telephone Encounter (Signed)
Prescription refill request for Eliquis received. Indication: Afib  Last office visit:11/10/20 ([place])  Scr: 0.68 (06/27/21 via [place])  Age: 88 Weight: 70.4kg  Appropriate dose and refill sent to requested pharmacy.

## 2021-11-17 DIAGNOSIS — H401122 Primary open-angle glaucoma, left eye, moderate stage: Secondary | ICD-10-CM | POA: Diagnosis not present

## 2021-11-17 DIAGNOSIS — H2512 Age-related nuclear cataract, left eye: Secondary | ICD-10-CM | POA: Diagnosis not present

## 2021-12-06 DIAGNOSIS — L218 Other seborrheic dermatitis: Secondary | ICD-10-CM | POA: Diagnosis not present

## 2021-12-06 DIAGNOSIS — L570 Actinic keratosis: Secondary | ICD-10-CM | POA: Diagnosis not present

## 2021-12-06 DIAGNOSIS — D225 Melanocytic nevi of trunk: Secondary | ICD-10-CM | POA: Diagnosis not present

## 2021-12-06 DIAGNOSIS — L814 Other melanin hyperpigmentation: Secondary | ICD-10-CM | POA: Diagnosis not present

## 2021-12-06 DIAGNOSIS — L821 Other seborrheic keratosis: Secondary | ICD-10-CM | POA: Diagnosis not present

## 2021-12-13 ENCOUNTER — Ambulatory Visit: Payer: Medicare PPO | Admitting: Cardiovascular Disease

## 2021-12-20 ENCOUNTER — Telehealth: Payer: Self-pay | Admitting: Cardiovascular Disease

## 2021-12-20 MED ORDER — DILTIAZEM HCL 30 MG PO TABS: 30.0000 mg | ORAL_TABLET | Freq: Every day | ORAL | 0 refills | Status: DC

## 2021-12-20 NOTE — Telephone Encounter (Signed)
*  STAT* If patient is at the pharmacy, call can be transferred to refill team.   1. Which medications need to be refilled? (please list name of each medication and dose if known) diltiazem (CARDIZEM) 30 MG tablet  2. Which pharmacy/location (including street and city if local pharmacy) is medication to be sent to? [place], [place] - [address]  3. Do they need a 30 day or 90 day supply? [place]

## 2021-12-20 NOTE — Telephone Encounter (Signed)
Pt's medication was sent to pt's pharmacy as requested. Confirmation received.  °

## 2021-12-23 ENCOUNTER — Ambulatory Visit: Payer: Medicare PPO | Admitting: Cardiovascular Disease

## 2021-12-25 ENCOUNTER — Encounter: Payer: Self-pay | Admitting: Cardiovascular Disease
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
## 2021-12-26 ENCOUNTER — Ambulatory Visit: Payer: Medicare PPO | Admitting: Cardiovascular Disease

## 2021-12-26 ENCOUNTER — Encounter: Payer: Self-pay | Admitting: Cardiovascular Disease

## 2021-12-26 VITALS — BP 132/70 | HR 80 | Ht 67.0 in | Wt 151.6 lb

## 2021-12-26 DIAGNOSIS — I480 Paroxysmal atrial fibrillation: Secondary | ICD-10-CM | POA: Diagnosis not present

## 2021-12-26 NOTE — Patient Instructions (Signed)
Medication Instructions:  Your physician recommends that you continue on your current medications as directed. Please refer to the Current Medication list given to you today.  *If you need a refill on your cardiac medications before your next appointment, please call your pharmacy*   Lab Work: NONE If you have labs (blood work) drawn today and your tests are completely normal, you will receive your results only by: Terri Ponce (if you have MyChart) OR A paper copy in the mail If you have any lab test that is abnormal or we need to change your treatment, we will call you to review the results.   Testing/Procedures: NONE   Follow-Up: At [facility], you and your health needs are our priority.  As part of our continuing mission to provide you with exceptional heart care, we have created designated Provider Care Teams.  These Care Teams include your primary Cardiologist (physician) and Advanced Practice Providers (APPs -  Physician Assistants and Nurse Practitioners) who all work together to provide you with the care you need, when you need it.  We recommend signing up for the patient portal called "MyChart".  Sign up information is provided on this After Visit Summary.  MyChart is used to connect with patients for Virtual Visits (Telemedicine).  Patients are able to view lab/test results, encounter notes, upcoming appointments, etc.  Non-urgent messages can be sent to your provider as well.   To learn more about what you can do with MyChart, go to [email or web address].    Your next appointment:   1 year(s)  The format for your next appointment:   In Person  Provider:   Nahser ,   Important Information About [place]

## 2022-01-03 DIAGNOSIS — I10 Essential (primary) hypertension: Secondary | ICD-10-CM | POA: Diagnosis not present

## 2022-01-03 DIAGNOSIS — D6869 Other thrombophilia: Secondary | ICD-10-CM | POA: Diagnosis not present

## 2022-01-03 DIAGNOSIS — I480 Paroxysmal atrial fibrillation: Secondary | ICD-10-CM | POA: Diagnosis not present

## 2022-01-14 ENCOUNTER — Other Ambulatory Visit: Payer: Self-pay | Admitting: Cardiovascular Disease

## 2022-02-02 ENCOUNTER — Telehealth: Payer: Self-pay

## 2022-02-02 NOTE — Patient Outreach (Signed)
  Care Coordination   02/02/2022 Name: BLIMY NAPOLEON MRN: [id number] DOB: 1928/07/27   Care Coordination Outreach Attempts:  An unsuccessful telephone outreach was attempted today to offer the patient information about available care coordination services as a benefit of their health plan.    Follow Up Plan:  Additional outreach attempts will be made to offer the patient care coordination information and services.   Encounter Outcome:  No Answer  Care Coordination Interventions Activated:  No   Care Coordination Interventions:  No, not indicated    [facility] [phone]

## 2022-02-09 DIAGNOSIS — H401132 Primary open-angle glaucoma, bilateral, moderate stage: Secondary | ICD-10-CM | POA: Diagnosis not present

## 2022-02-09 DIAGNOSIS — H2512 Age-related nuclear cataract, left eye: Secondary | ICD-10-CM | POA: Diagnosis not present

## 2022-02-09 DIAGNOSIS — Z961 Presence of intraocular lens: Secondary | ICD-10-CM | POA: Diagnosis not present

## 2022-03-10 ENCOUNTER — Other Ambulatory Visit: Payer: Self-pay | Admitting: Geriatric Medicine

## 2022-03-10 DIAGNOSIS — Z1231 Encounter for screening mammogram for malignant neoplasm of breast: Secondary | ICD-10-CM

## 2022-03-29 ENCOUNTER — Other Ambulatory Visit: Payer: Self-pay | Admitting: Cardiovascular Disease

## 2022-03-29 DIAGNOSIS — I480 Paroxysmal atrial fibrillation: Secondary | ICD-10-CM

## 2022-03-29 NOTE — Telephone Encounter (Signed)
Prescription refill request for Eliquis received. Indication: Afib  Last office visit: 12/26/21 (Nahser)  Scr: 0.68 (06/27/21 via [place])  Age: 88 Weight: 68.8kg  Appropriate dose and refill sent to requested pharmacy.

## 2022-04-24 DIAGNOSIS — R0981 Nasal congestion: Secondary | ICD-10-CM | POA: Diagnosis not present

## 2022-04-24 DIAGNOSIS — J329 Chronic sinusitis, unspecified: Secondary | ICD-10-CM | POA: Diagnosis not present

## 2022-05-02 ENCOUNTER — Ambulatory Visit: Payer: Medicare PPO

## 2022-05-17 DIAGNOSIS — N814 Uterovaginal prolapse, unspecified: Secondary | ICD-10-CM | POA: Diagnosis not present

## 2022-05-17 DIAGNOSIS — Z4689 Encounter for fitting and adjustment of other specified devices: Secondary | ICD-10-CM | POA: Diagnosis not present

## 2022-06-20 ENCOUNTER — Ambulatory Visit
Admission: RE | Admit: 2022-06-20 | Discharge: 2022-06-20 | Disposition: A | Discharge status: Home / Self Care | Payer: Medicare PPO | Source: Ambulatory Visit | Attending: Geriatric Medicine | Admitting: Geriatric Medicine

## 2022-06-20 DIAGNOSIS — Z1231 Encounter for screening mammogram for malignant neoplasm of breast: Secondary | ICD-10-CM

## 2022-07-27 ENCOUNTER — Telehealth: Payer: Self-pay | Admitting: Cardiovascular Disease

## 2022-07-27 MED ORDER — POTASSIUM CHLORIDE ER 10 MEQ PO TBCR: 10.0000 meq | EXTENDED_RELEASE_TABLET | Freq: Every day | ORAL | 3 refills | Status: DC

## 2022-07-27 MED ORDER — HYDROCHLOROTHIAZIDE 25 MG PO TABS: 25.0000 mg | ORAL_TABLET | Freq: Every day | ORAL | 3 refills | Status: DC

## 2022-07-27 NOTE — Telephone Encounter (Signed)
Per Nahser via secure chat:  Good afternoon. Will you work Terri Ponce into my Monday dod schedule. She is having htn and leg swelling . She was also on a diuretic and potassium but she threw them away because she didn't think she needed them any more . Would you also be able to send in refills of those? Thanks , you're the best . Hope you are having a good day  Looks like hctz 25. I don't see a kdur but I think 10 meq a day would work

## 2022-07-27 NOTE — Telephone Encounter (Signed)
Patient's daughter is returning call from Dr. Elmarie Shiley nurse to schedule for Monday, 3/11. She mentions that she will be unavailable from 1:00 - 4:00 PM.

## 2022-07-27 NOTE — Telephone Encounter (Signed)
Called and spoke with daughter [name]. Patient is scheduled for 07/31/22 '@4'$ :30pm. Medications sent to [facility] and she will get started on them today.

## 2022-07-30 ENCOUNTER — Encounter: Payer: Self-pay | Admitting: Cardiovascular Disease
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
## 2022-07-31 ENCOUNTER — Ambulatory Visit: Payer: Medicare PPO | Attending: Cardiovascular Disease | Admitting: Cardiovascular Disease

## 2022-07-31 ENCOUNTER — Encounter: Payer: Self-pay | Admitting: Cardiovascular Disease

## 2022-07-31 VITALS — BP 134/80 | HR 83 | Ht 66.75 in | Wt 151.2 lb

## 2022-07-31 DIAGNOSIS — Z79899 Other long term (current) drug therapy: Secondary | ICD-10-CM | POA: Diagnosis not present

## 2022-07-31 DIAGNOSIS — I10 Essential (primary) hypertension: Secondary | ICD-10-CM

## 2022-07-31 DIAGNOSIS — I480 Paroxysmal atrial fibrillation: Secondary | ICD-10-CM | POA: Diagnosis not present

## 2022-07-31 NOTE — Patient Instructions (Signed)
Medication Instructions:  Your physician recommends that you continue on your current medications as directed. Please refer to the Current Medication list given to you today.  *If you need a refill on your cardiac medications before your next appointment, please call your pharmacy*   Lab Work: BMET in 2 weeks If you have labs (blood work) drawn today and your tests are completely normal, you will receive your results only by: [place] (if you have MyChart) OR A paper copy in the mail If you have any lab test that is abnormal or we need to change your treatment, we will call you to review the results.   Testing/Procedures: NONE   Follow-Up: At [facility], you and your health needs are our priority.  As part of our continuing mission to provide you with exceptional heart care, we have created designated Provider Care Teams.  These Care Teams include your primary Cardiologist (physician) and Advanced Practice Providers (APPs -  Physician Assistants and Nurse Practitioners) who all work together to provide you with the care you need, when you need it.  We recommend signing up for the patient portal called "MyChart".  Sign up information is provided on this After Visit Summary.  MyChart is used to connect with patients for Virtual Visits (Telemedicine).  Patients are able to view lab/test results, encounter notes, upcoming appointments, etc.  Non-urgent messages can be sent to your provider as well.   To learn more about what you can do with MyChart, go to [email or web address].    Your next appointment:   July/Aug 2024  Provider:   Mertie Moores, MD

## 2022-08-10 LAB — LAB REPORT - SCANNED: EGFR: 72

## 2022-08-11 ENCOUNTER — Telehealth: Payer: Self-pay | Admitting: Cardiovascular Disease

## 2022-08-11 NOTE — Telephone Encounter (Signed)
Pt's daughter stated the pt's pcp did blood work on her at [place] appointment so they'd send over results to provider so that the pt wouldn't have to have labs done again on the 26th. She would like a callback regarding this matter to see if its okay to cancel pt's upcoming labs.

## 2022-08-11 NOTE — Telephone Encounter (Signed)
Spoke with patient, informed no need for lab appointment here since we do have PCP records from 08/10/22. Lab appointment cancelled, labs reviewed, all within normal range. No changes to be made

## 2022-08-15 ENCOUNTER — Ambulatory Visit: Payer: Medicare PPO

## 2022-09-26 DIAGNOSIS — H401131 Primary open-angle glaucoma, bilateral, mild stage: Secondary | ICD-10-CM | POA: Diagnosis not present

## 2022-12-02 ENCOUNTER — Other Ambulatory Visit: Payer: Self-pay | Admitting: Cardiovascular Disease

## 2022-12-20 DIAGNOSIS — L814 Other melanin hyperpigmentation: Secondary | ICD-10-CM | POA: Diagnosis not present

## 2022-12-20 DIAGNOSIS — L218 Other seborrheic dermatitis: Secondary | ICD-10-CM | POA: Diagnosis not present

## 2022-12-20 DIAGNOSIS — L821 Other seborrheic keratosis: Secondary | ICD-10-CM | POA: Diagnosis not present

## 2022-12-20 DIAGNOSIS — D225 Melanocytic nevi of trunk: Secondary | ICD-10-CM | POA: Diagnosis not present

## 2023-01-14 ENCOUNTER — Encounter: Payer: Self-pay | Admitting: Cardiovascular Disease
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
## 2023-01-15 ENCOUNTER — Encounter: Payer: Self-pay | Admitting: Cardiovascular Disease

## 2023-01-15 ENCOUNTER — Ambulatory Visit: Payer: Medicare PPO | Attending: Cardiovascular Disease | Admitting: Cardiovascular Disease

## 2023-01-15 VITALS — BP 128/70 | HR 59 | Ht 66.75 in | Wt 152.2 lb

## 2023-01-15 DIAGNOSIS — I480 Paroxysmal atrial fibrillation: Secondary | ICD-10-CM

## 2023-01-15 DIAGNOSIS — I10 Essential (primary) hypertension: Secondary | ICD-10-CM

## 2023-01-15 MED ORDER — DILTIAZEM HCL 30 MG PO TABS: 30.0000 mg | ORAL_TABLET | Freq: Every day | ORAL | 3 refills | Status: DC

## 2023-01-15 NOTE — Patient Instructions (Signed)
Medication Instructions:   STOP TAKING hydrochlorothiazide NOW  *If you need a refill on your cardiac medications before your next appointment, please call your pharmacy*    Follow-Up: At [facility], you and your health needs are our priority.  As part of our continuing mission to provide you with exceptional heart care, we have created designated Provider Care Teams.  These Care Teams include your primary Cardiologist (physician) and Advanced Practice Providers (APPs -  Physician Assistants and Nurse Practitioners) who all work together to provide you with the care you need, when you need it.  We recommend signing up for the patient portal called "MyChart".  Sign up information is provided on this After Visit Summary.  MyChart is used to connect with patients for Virtual Visits (Telemedicine).  Patients are able to view lab/test results, encounter notes, upcoming appointments, etc.  Non-urgent messages can be sent to your provider as well.   To learn more about what you can do with MyChart, go to [email or web address].    Your next appointment:   6 month(s)  Provider:   Kristeen Miss, MD

## 2023-02-14 DIAGNOSIS — R2689 Other abnormalities of gait and mobility: Secondary | ICD-10-CM | POA: Diagnosis not present

## 2023-02-14 DIAGNOSIS — E559 Vitamin D deficiency, unspecified: Secondary | ICD-10-CM | POA: Diagnosis not present

## 2023-02-14 DIAGNOSIS — I480 Paroxysmal atrial fibrillation: Secondary | ICD-10-CM | POA: Diagnosis not present

## 2023-02-14 DIAGNOSIS — M810 Age-related osteoporosis without current pathological fracture: Secondary | ICD-10-CM | POA: Diagnosis not present

## 2023-02-14 DIAGNOSIS — I10 Essential (primary) hypertension: Secondary | ICD-10-CM | POA: Diagnosis not present

## 2023-02-15 DIAGNOSIS — H401133 Primary open-angle glaucoma, bilateral, severe stage: Secondary | ICD-10-CM | POA: Diagnosis not present

## 2023-02-15 DIAGNOSIS — H5203 Hypermetropia, bilateral: Secondary | ICD-10-CM | POA: Diagnosis not present

## 2023-02-15 DIAGNOSIS — H2512 Age-related nuclear cataract, left eye: Secondary | ICD-10-CM | POA: Diagnosis not present

## 2023-03-26 ENCOUNTER — Encounter: Payer: Self-pay | Admitting: Cardiovascular Disease

## 2023-03-26 ENCOUNTER — Ambulatory Visit: Payer: Medicare PPO | Attending: Cardiovascular Disease | Admitting: Cardiovascular Disease

## 2023-03-26 VITALS — BP 128/60 | HR 72 | Ht 67.0 in | Wt 150.0 lb

## 2023-03-26 DIAGNOSIS — I480 Paroxysmal atrial fibrillation: Secondary | ICD-10-CM | POA: Diagnosis not present

## 2023-03-26 MED ORDER — DILTIAZEM HCL 30 MG PO TABS: ORAL_TABLET | ORAL | 6 refills | Status: DC

## 2023-03-26 NOTE — Patient Instructions (Signed)
Medication Instructions:  CHANGE Diltiazem 30mg  to twice daily and as needed for palpitations *If you need a refill on your cardiac medications before your next appointment, please call your pharmacy*  Follow-Up: At [facility], you and your health needs are our priority.  As part of our continuing mission to provide you with exceptional heart care, we have created designated Provider Care Teams.  These Care Teams include your primary Cardiologist (physician) and Advanced Practice Providers (APPs -  Physician Assistants and Nurse Practitioners) who all work together to provide you with the care you need, when you need it.  Your next appointment:   As Scheduled  Provider:   Kristeen Miss, MD
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
## 2023-04-20 ENCOUNTER — Other Ambulatory Visit: Payer: Self-pay | Admitting: Cardiovascular Disease

## 2023-04-20 DIAGNOSIS — I480 Paroxysmal atrial fibrillation: Secondary | ICD-10-CM

## 2023-04-23 NOTE — Telephone Encounter (Signed)
Prescription refill request for Eliquis received. Indication:afib Last office visit:11/24 Scr:0.78  3/24 Age: 89 Weight:68  kg  Prescription refilled

## 2023-05-28 DIAGNOSIS — H401133 Primary open-angle glaucoma, bilateral, severe stage: Secondary | ICD-10-CM | POA: Diagnosis not present

## 2023-06-04 ENCOUNTER — Other Ambulatory Visit: Payer: Self-pay | Admitting: Internal Medicine

## 2023-06-04 DIAGNOSIS — Z1231 Encounter for screening mammogram for malignant neoplasm of breast: Secondary | ICD-10-CM

## 2023-06-15 ENCOUNTER — Other Ambulatory Visit: Payer: Self-pay | Admitting: Internal Medicine

## 2023-06-15 ENCOUNTER — Ambulatory Visit
Admission: RE | Admit: 2023-06-15 | Discharge: 2023-06-15 | Disposition: A | Discharge status: Home / Self Care | Payer: Medicare PPO | Source: Ambulatory Visit | Attending: Internal Medicine | Admitting: Internal Medicine

## 2023-06-15 DIAGNOSIS — K5909 Other constipation: Secondary | ICD-10-CM | POA: Diagnosis not present

## 2023-06-15 DIAGNOSIS — S299XXA Unspecified injury of thorax, initial encounter: Secondary | ICD-10-CM

## 2023-06-15 DIAGNOSIS — S2231XA Fracture of one rib, right side, initial encounter for closed fracture: Secondary | ICD-10-CM | POA: Diagnosis not present

## 2023-06-15 DIAGNOSIS — R0781 Pleurodynia: Secondary | ICD-10-CM | POA: Diagnosis not present

## 2023-06-22 ENCOUNTER — Ambulatory Visit: Payer: Medicare PPO

## 2023-07-16 ENCOUNTER — Encounter: Payer: Self-pay | Admitting: Cardiovascular Disease
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
## 2023-07-17 ENCOUNTER — Encounter: Payer: Self-pay | Admitting: Cardiovascular Disease

## 2023-07-17 ENCOUNTER — Ambulatory Visit: Payer: Medicare PPO | Attending: Cardiovascular Disease | Admitting: Cardiovascular Disease

## 2023-07-17 VITALS — BP 134/68 | HR 80 | Ht 67.0 in | Wt 148.8 lb

## 2023-07-17 DIAGNOSIS — I480 Paroxysmal atrial fibrillation: Secondary | ICD-10-CM

## 2023-07-17 NOTE — Patient Instructions (Signed)
 Follow-Up: At [facility], you and your health needs are our priority.  As part of our continuing mission to provide you with exceptional heart care, we have created designated Provider Care Teams.  These Care Teams include your primary Cardiologist (physician) and Advanced Practice Providers (APPs -  Physician Assistants and Nurse Practitioners) who all work together to provide you with the care you need, when you need it.  Your next appointment:   6 month(s)  Provider:   Dietrich Pates, MD      1st Floor: - Lobby - Registration  - Pharmacy  - Lab - Cafe  2nd Floor: - PV Lab - Diagnostic Testing (echo, CT, nuclear med)  3rd Floor: - Vacant  4th Floor: - TCTS (cardiothoracic surgery) - AFib Clinic - Structural Heart Clinic - Vascular Surgery  - Vascular Ultrasound  5th Floor: - HeartCare Cardiology (general and EP) - Clinical Pharmacy for coumadin, hypertension, lipid, weight-loss medications, and med management appointments    Valet parking services will be available as well.

## 2023-07-19 DIAGNOSIS — K5909 Other constipation: Secondary | ICD-10-CM | POA: Diagnosis not present

## 2023-07-19 DIAGNOSIS — W19XXXD Unspecified fall, subsequent encounter: Secondary | ICD-10-CM | POA: Diagnosis not present

## 2023-07-19 DIAGNOSIS — E7800 Pure hypercholesterolemia, unspecified: Secondary | ICD-10-CM | POA: Diagnosis not present

## 2023-07-19 DIAGNOSIS — I700 Atherosclerosis of aorta: Secondary | ICD-10-CM | POA: Diagnosis not present

## 2023-07-19 DIAGNOSIS — S2232XD Fracture of one rib, left side, subsequent encounter for fracture with routine healing: Secondary | ICD-10-CM | POA: Diagnosis not present

## 2023-07-19 DIAGNOSIS — I480 Paroxysmal atrial fibrillation: Secondary | ICD-10-CM | POA: Diagnosis not present

## 2023-07-19 DIAGNOSIS — R7303 Prediabetes: Secondary | ICD-10-CM | POA: Diagnosis not present

## 2023-07-19 DIAGNOSIS — I6523 Occlusion and stenosis of bilateral carotid arteries: Secondary | ICD-10-CM | POA: Diagnosis not present

## 2023-07-19 DIAGNOSIS — I10 Essential (primary) hypertension: Secondary | ICD-10-CM | POA: Diagnosis not present

## 2023-07-20 DIAGNOSIS — J0110 Acute frontal sinusitis, unspecified: Secondary | ICD-10-CM | POA: Diagnosis not present

## 2023-07-23 DIAGNOSIS — I700 Atherosclerosis of aorta: Secondary | ICD-10-CM | POA: Diagnosis not present

## 2023-07-23 DIAGNOSIS — I6523 Occlusion and stenosis of bilateral carotid arteries: Secondary | ICD-10-CM | POA: Diagnosis not present

## 2023-07-23 DIAGNOSIS — R7303 Prediabetes: Secondary | ICD-10-CM | POA: Diagnosis not present

## 2023-07-23 DIAGNOSIS — I10 Essential (primary) hypertension: Secondary | ICD-10-CM | POA: Diagnosis not present

## 2023-07-23 DIAGNOSIS — W19XXXD Unspecified fall, subsequent encounter: Secondary | ICD-10-CM | POA: Diagnosis not present

## 2023-07-23 DIAGNOSIS — I480 Paroxysmal atrial fibrillation: Secondary | ICD-10-CM | POA: Diagnosis not present

## 2023-07-23 DIAGNOSIS — K5909 Other constipation: Secondary | ICD-10-CM | POA: Diagnosis not present

## 2023-07-23 DIAGNOSIS — E7800 Pure hypercholesterolemia, unspecified: Secondary | ICD-10-CM | POA: Diagnosis not present

## 2023-07-23 DIAGNOSIS — S2232XD Fracture of one rib, left side, subsequent encounter for fracture with routine healing: Secondary | ICD-10-CM | POA: Diagnosis not present

## 2023-07-24 DIAGNOSIS — I480 Paroxysmal atrial fibrillation: Secondary | ICD-10-CM | POA: Diagnosis not present

## 2023-07-24 DIAGNOSIS — I700 Atherosclerosis of aorta: Secondary | ICD-10-CM | POA: Diagnosis not present

## 2023-07-24 DIAGNOSIS — K5909 Other constipation: Secondary | ICD-10-CM | POA: Diagnosis not present

## 2023-07-24 DIAGNOSIS — E7800 Pure hypercholesterolemia, unspecified: Secondary | ICD-10-CM | POA: Diagnosis not present

## 2023-07-24 DIAGNOSIS — R7303 Prediabetes: Secondary | ICD-10-CM | POA: Diagnosis not present

## 2023-07-24 DIAGNOSIS — S2232XD Fracture of one rib, left side, subsequent encounter for fracture with routine healing: Secondary | ICD-10-CM | POA: Diagnosis not present

## 2023-07-24 DIAGNOSIS — W19XXXD Unspecified fall, subsequent encounter: Secondary | ICD-10-CM | POA: Diagnosis not present

## 2023-07-24 DIAGNOSIS — I6523 Occlusion and stenosis of bilateral carotid arteries: Secondary | ICD-10-CM | POA: Diagnosis not present

## 2023-07-24 DIAGNOSIS — I10 Essential (primary) hypertension: Secondary | ICD-10-CM | POA: Diagnosis not present

## 2023-07-27 DIAGNOSIS — I700 Atherosclerosis of aorta: Secondary | ICD-10-CM | POA: Diagnosis not present

## 2023-07-27 DIAGNOSIS — R7303 Prediabetes: Secondary | ICD-10-CM | POA: Diagnosis not present

## 2023-07-27 DIAGNOSIS — I480 Paroxysmal atrial fibrillation: Secondary | ICD-10-CM | POA: Diagnosis not present

## 2023-07-27 DIAGNOSIS — E7800 Pure hypercholesterolemia, unspecified: Secondary | ICD-10-CM | POA: Diagnosis not present

## 2023-07-27 DIAGNOSIS — W19XXXD Unspecified fall, subsequent encounter: Secondary | ICD-10-CM | POA: Diagnosis not present

## 2023-07-27 DIAGNOSIS — I10 Essential (primary) hypertension: Secondary | ICD-10-CM | POA: Diagnosis not present

## 2023-07-27 DIAGNOSIS — K5909 Other constipation: Secondary | ICD-10-CM | POA: Diagnosis not present

## 2023-07-27 DIAGNOSIS — S2232XD Fracture of one rib, left side, subsequent encounter for fracture with routine healing: Secondary | ICD-10-CM | POA: Diagnosis not present

## 2023-07-27 DIAGNOSIS — I6523 Occlusion and stenosis of bilateral carotid arteries: Secondary | ICD-10-CM | POA: Diagnosis not present

## 2023-07-30 DIAGNOSIS — I6523 Occlusion and stenosis of bilateral carotid arteries: Secondary | ICD-10-CM | POA: Diagnosis not present

## 2023-07-30 DIAGNOSIS — I700 Atherosclerosis of aorta: Secondary | ICD-10-CM | POA: Diagnosis not present

## 2023-07-30 DIAGNOSIS — E7800 Pure hypercholesterolemia, unspecified: Secondary | ICD-10-CM | POA: Diagnosis not present

## 2023-07-30 DIAGNOSIS — W19XXXD Unspecified fall, subsequent encounter: Secondary | ICD-10-CM | POA: Diagnosis not present

## 2023-07-30 DIAGNOSIS — I10 Essential (primary) hypertension: Secondary | ICD-10-CM | POA: Diagnosis not present

## 2023-07-30 DIAGNOSIS — R7303 Prediabetes: Secondary | ICD-10-CM | POA: Diagnosis not present

## 2023-07-30 DIAGNOSIS — K5909 Other constipation: Secondary | ICD-10-CM | POA: Diagnosis not present

## 2023-07-30 DIAGNOSIS — I480 Paroxysmal atrial fibrillation: Secondary | ICD-10-CM | POA: Diagnosis not present

## 2023-07-30 DIAGNOSIS — S2232XD Fracture of one rib, left side, subsequent encounter for fracture with routine healing: Secondary | ICD-10-CM | POA: Diagnosis not present

## 2023-07-31 DIAGNOSIS — H401113 Primary open-angle glaucoma, right eye, severe stage: Secondary | ICD-10-CM | POA: Diagnosis not present

## 2023-07-31 DIAGNOSIS — H2512 Age-related nuclear cataract, left eye: Secondary | ICD-10-CM | POA: Diagnosis not present

## 2023-08-02 DIAGNOSIS — R7303 Prediabetes: Secondary | ICD-10-CM | POA: Diagnosis not present

## 2023-08-02 DIAGNOSIS — W19XXXD Unspecified fall, subsequent encounter: Secondary | ICD-10-CM | POA: Diagnosis not present

## 2023-08-02 DIAGNOSIS — I6523 Occlusion and stenosis of bilateral carotid arteries: Secondary | ICD-10-CM | POA: Diagnosis not present

## 2023-08-02 DIAGNOSIS — I10 Essential (primary) hypertension: Secondary | ICD-10-CM | POA: Diagnosis not present

## 2023-08-02 DIAGNOSIS — K5909 Other constipation: Secondary | ICD-10-CM | POA: Diagnosis not present

## 2023-08-02 DIAGNOSIS — I480 Paroxysmal atrial fibrillation: Secondary | ICD-10-CM | POA: Diagnosis not present

## 2023-08-02 DIAGNOSIS — I700 Atherosclerosis of aorta: Secondary | ICD-10-CM | POA: Diagnosis not present

## 2023-08-02 DIAGNOSIS — E7800 Pure hypercholesterolemia, unspecified: Secondary | ICD-10-CM | POA: Diagnosis not present

## 2023-08-02 DIAGNOSIS — S2232XD Fracture of one rib, left side, subsequent encounter for fracture with routine healing: Secondary | ICD-10-CM | POA: Diagnosis not present

## 2023-08-03 DIAGNOSIS — W19XXXD Unspecified fall, subsequent encounter: Secondary | ICD-10-CM | POA: Diagnosis not present

## 2023-08-03 DIAGNOSIS — I6523 Occlusion and stenosis of bilateral carotid arteries: Secondary | ICD-10-CM | POA: Diagnosis not present

## 2023-08-03 DIAGNOSIS — I10 Essential (primary) hypertension: Secondary | ICD-10-CM | POA: Diagnosis not present

## 2023-08-03 DIAGNOSIS — S2232XD Fracture of one rib, left side, subsequent encounter for fracture with routine healing: Secondary | ICD-10-CM | POA: Diagnosis not present

## 2023-08-03 DIAGNOSIS — E7800 Pure hypercholesterolemia, unspecified: Secondary | ICD-10-CM | POA: Diagnosis not present

## 2023-08-03 DIAGNOSIS — I480 Paroxysmal atrial fibrillation: Secondary | ICD-10-CM | POA: Diagnosis not present

## 2023-08-03 DIAGNOSIS — R7303 Prediabetes: Secondary | ICD-10-CM | POA: Diagnosis not present

## 2023-08-03 DIAGNOSIS — K5909 Other constipation: Secondary | ICD-10-CM | POA: Diagnosis not present

## 2023-08-03 DIAGNOSIS — I700 Atherosclerosis of aorta: Secondary | ICD-10-CM | POA: Diagnosis not present

## 2023-08-06 DIAGNOSIS — R7303 Prediabetes: Secondary | ICD-10-CM | POA: Diagnosis not present

## 2023-08-06 DIAGNOSIS — E7800 Pure hypercholesterolemia, unspecified: Secondary | ICD-10-CM | POA: Diagnosis not present

## 2023-08-06 DIAGNOSIS — K5909 Other constipation: Secondary | ICD-10-CM | POA: Diagnosis not present

## 2023-08-06 DIAGNOSIS — I6523 Occlusion and stenosis of bilateral carotid arteries: Secondary | ICD-10-CM | POA: Diagnosis not present

## 2023-08-06 DIAGNOSIS — I10 Essential (primary) hypertension: Secondary | ICD-10-CM | POA: Diagnosis not present

## 2023-08-06 DIAGNOSIS — S2232XD Fracture of one rib, left side, subsequent encounter for fracture with routine healing: Secondary | ICD-10-CM | POA: Diagnosis not present

## 2023-08-06 DIAGNOSIS — I480 Paroxysmal atrial fibrillation: Secondary | ICD-10-CM | POA: Diagnosis not present

## 2023-08-06 DIAGNOSIS — I700 Atherosclerosis of aorta: Secondary | ICD-10-CM | POA: Diagnosis not present

## 2023-08-06 DIAGNOSIS — W19XXXD Unspecified fall, subsequent encounter: Secondary | ICD-10-CM | POA: Diagnosis not present

## 2023-08-09 DIAGNOSIS — K5909 Other constipation: Secondary | ICD-10-CM | POA: Diagnosis not present

## 2023-08-09 DIAGNOSIS — R7303 Prediabetes: Secondary | ICD-10-CM | POA: Diagnosis not present

## 2023-08-09 DIAGNOSIS — I700 Atherosclerosis of aorta: Secondary | ICD-10-CM | POA: Diagnosis not present

## 2023-08-09 DIAGNOSIS — I480 Paroxysmal atrial fibrillation: Secondary | ICD-10-CM | POA: Diagnosis not present

## 2023-08-09 DIAGNOSIS — E7800 Pure hypercholesterolemia, unspecified: Secondary | ICD-10-CM | POA: Diagnosis not present

## 2023-08-09 DIAGNOSIS — I10 Essential (primary) hypertension: Secondary | ICD-10-CM | POA: Diagnosis not present

## 2023-08-09 DIAGNOSIS — I6523 Occlusion and stenosis of bilateral carotid arteries: Secondary | ICD-10-CM | POA: Diagnosis not present

## 2023-08-09 DIAGNOSIS — S2232XD Fracture of one rib, left side, subsequent encounter for fracture with routine healing: Secondary | ICD-10-CM | POA: Diagnosis not present

## 2023-08-09 DIAGNOSIS — W19XXXD Unspecified fall, subsequent encounter: Secondary | ICD-10-CM | POA: Diagnosis not present

## 2023-08-10 DIAGNOSIS — E7800 Pure hypercholesterolemia, unspecified: Secondary | ICD-10-CM | POA: Diagnosis not present

## 2023-08-10 DIAGNOSIS — I480 Paroxysmal atrial fibrillation: Secondary | ICD-10-CM | POA: Diagnosis not present

## 2023-08-10 DIAGNOSIS — I6523 Occlusion and stenosis of bilateral carotid arteries: Secondary | ICD-10-CM | POA: Diagnosis not present

## 2023-08-10 DIAGNOSIS — S2232XD Fracture of one rib, left side, subsequent encounter for fracture with routine healing: Secondary | ICD-10-CM | POA: Diagnosis not present

## 2023-08-10 DIAGNOSIS — K5909 Other constipation: Secondary | ICD-10-CM | POA: Diagnosis not present

## 2023-08-10 DIAGNOSIS — W19XXXD Unspecified fall, subsequent encounter: Secondary | ICD-10-CM | POA: Diagnosis not present

## 2023-08-10 DIAGNOSIS — R7303 Prediabetes: Secondary | ICD-10-CM | POA: Diagnosis not present

## 2023-08-10 DIAGNOSIS — I10 Essential (primary) hypertension: Secondary | ICD-10-CM | POA: Diagnosis not present

## 2023-08-10 DIAGNOSIS — I700 Atherosclerosis of aorta: Secondary | ICD-10-CM | POA: Diagnosis not present

## 2023-08-13 DIAGNOSIS — W19XXXD Unspecified fall, subsequent encounter: Secondary | ICD-10-CM | POA: Diagnosis not present

## 2023-08-13 DIAGNOSIS — I700 Atherosclerosis of aorta: Secondary | ICD-10-CM | POA: Diagnosis not present

## 2023-08-13 DIAGNOSIS — E7800 Pure hypercholesterolemia, unspecified: Secondary | ICD-10-CM | POA: Diagnosis not present

## 2023-08-13 DIAGNOSIS — I6523 Occlusion and stenosis of bilateral carotid arteries: Secondary | ICD-10-CM | POA: Diagnosis not present

## 2023-08-13 DIAGNOSIS — K5909 Other constipation: Secondary | ICD-10-CM | POA: Diagnosis not present

## 2023-08-13 DIAGNOSIS — R7303 Prediabetes: Secondary | ICD-10-CM | POA: Diagnosis not present

## 2023-08-13 DIAGNOSIS — S2232XD Fracture of one rib, left side, subsequent encounter for fracture with routine healing: Secondary | ICD-10-CM | POA: Diagnosis not present

## 2023-08-13 DIAGNOSIS — I10 Essential (primary) hypertension: Secondary | ICD-10-CM | POA: Diagnosis not present

## 2023-08-13 DIAGNOSIS — I480 Paroxysmal atrial fibrillation: Secondary | ICD-10-CM | POA: Diagnosis not present

## 2023-08-15 DIAGNOSIS — E559 Vitamin D deficiency, unspecified: Secondary | ICD-10-CM | POA: Diagnosis not present

## 2023-08-15 DIAGNOSIS — H9202 Otalgia, left ear: Secondary | ICD-10-CM | POA: Diagnosis not present

## 2023-08-15 DIAGNOSIS — Z1331 Encounter for screening for depression: Secondary | ICD-10-CM | POA: Diagnosis not present

## 2023-08-15 DIAGNOSIS — D6869 Other thrombophilia: Secondary | ICD-10-CM | POA: Diagnosis not present

## 2023-08-15 DIAGNOSIS — Z79899 Other long term (current) drug therapy: Secondary | ICD-10-CM | POA: Diagnosis not present

## 2023-08-15 DIAGNOSIS — I10 Essential (primary) hypertension: Secondary | ICD-10-CM | POA: Diagnosis not present

## 2023-08-15 DIAGNOSIS — I480 Paroxysmal atrial fibrillation: Secondary | ICD-10-CM | POA: Diagnosis not present

## 2023-08-15 DIAGNOSIS — M810 Age-related osteoporosis without current pathological fracture: Secondary | ICD-10-CM | POA: Diagnosis not present

## 2023-08-15 DIAGNOSIS — R2689 Other abnormalities of gait and mobility: Secondary | ICD-10-CM | POA: Diagnosis not present

## 2023-08-15 DIAGNOSIS — Z0000 Encounter for general adult medical examination without abnormal findings: Secondary | ICD-10-CM | POA: Diagnosis not present

## 2023-08-20 DIAGNOSIS — I6523 Occlusion and stenosis of bilateral carotid arteries: Secondary | ICD-10-CM | POA: Diagnosis not present

## 2023-08-20 DIAGNOSIS — I10 Essential (primary) hypertension: Secondary | ICD-10-CM | POA: Diagnosis not present

## 2023-08-20 DIAGNOSIS — S2232XD Fracture of one rib, left side, subsequent encounter for fracture with routine healing: Secondary | ICD-10-CM | POA: Diagnosis not present

## 2023-08-20 DIAGNOSIS — W19XXXD Unspecified fall, subsequent encounter: Secondary | ICD-10-CM | POA: Diagnosis not present

## 2023-08-20 DIAGNOSIS — E7800 Pure hypercholesterolemia, unspecified: Secondary | ICD-10-CM | POA: Diagnosis not present

## 2023-08-20 DIAGNOSIS — K5909 Other constipation: Secondary | ICD-10-CM | POA: Diagnosis not present

## 2023-08-20 DIAGNOSIS — I480 Paroxysmal atrial fibrillation: Secondary | ICD-10-CM | POA: Diagnosis not present

## 2023-08-20 DIAGNOSIS — I700 Atherosclerosis of aorta: Secondary | ICD-10-CM | POA: Diagnosis not present

## 2023-08-20 DIAGNOSIS — R7303 Prediabetes: Secondary | ICD-10-CM | POA: Diagnosis not present

## 2023-08-21 DIAGNOSIS — I10 Essential (primary) hypertension: Secondary | ICD-10-CM | POA: Diagnosis not present

## 2023-08-21 DIAGNOSIS — W19XXXD Unspecified fall, subsequent encounter: Secondary | ICD-10-CM | POA: Diagnosis not present

## 2023-08-21 DIAGNOSIS — I480 Paroxysmal atrial fibrillation: Secondary | ICD-10-CM | POA: Diagnosis not present

## 2023-08-21 DIAGNOSIS — R7303 Prediabetes: Secondary | ICD-10-CM | POA: Diagnosis not present

## 2023-08-21 DIAGNOSIS — K5909 Other constipation: Secondary | ICD-10-CM | POA: Diagnosis not present

## 2023-08-21 DIAGNOSIS — S2232XD Fracture of one rib, left side, subsequent encounter for fracture with routine healing: Secondary | ICD-10-CM | POA: Diagnosis not present

## 2023-08-21 DIAGNOSIS — E7800 Pure hypercholesterolemia, unspecified: Secondary | ICD-10-CM | POA: Diagnosis not present

## 2023-08-21 DIAGNOSIS — I6523 Occlusion and stenosis of bilateral carotid arteries: Secondary | ICD-10-CM | POA: Diagnosis not present

## 2023-08-21 DIAGNOSIS — I700 Atherosclerosis of aorta: Secondary | ICD-10-CM | POA: Diagnosis not present

## 2023-08-27 DIAGNOSIS — Z4689 Encounter for fitting and adjustment of other specified devices: Secondary | ICD-10-CM | POA: Diagnosis not present

## 2023-08-28 DIAGNOSIS — I480 Paroxysmal atrial fibrillation: Secondary | ICD-10-CM | POA: Diagnosis not present

## 2023-08-28 DIAGNOSIS — K5909 Other constipation: Secondary | ICD-10-CM | POA: Diagnosis not present

## 2023-08-28 DIAGNOSIS — I6523 Occlusion and stenosis of bilateral carotid arteries: Secondary | ICD-10-CM | POA: Diagnosis not present

## 2023-08-28 DIAGNOSIS — R7303 Prediabetes: Secondary | ICD-10-CM | POA: Diagnosis not present

## 2023-08-28 DIAGNOSIS — S2232XD Fracture of one rib, left side, subsequent encounter for fracture with routine healing: Secondary | ICD-10-CM | POA: Diagnosis not present

## 2023-08-28 DIAGNOSIS — E7800 Pure hypercholesterolemia, unspecified: Secondary | ICD-10-CM | POA: Diagnosis not present

## 2023-08-28 DIAGNOSIS — I10 Essential (primary) hypertension: Secondary | ICD-10-CM | POA: Diagnosis not present

## 2023-08-28 DIAGNOSIS — I700 Atherosclerosis of aorta: Secondary | ICD-10-CM | POA: Diagnosis not present

## 2023-08-28 DIAGNOSIS — W19XXXD Unspecified fall, subsequent encounter: Secondary | ICD-10-CM | POA: Diagnosis not present

## 2023-08-30 ENCOUNTER — Ambulatory Visit

## 2023-08-30 ENCOUNTER — Ambulatory Visit
Admission: RE | Admit: 2023-08-30 | Discharge: 2023-08-30 | Disposition: A | Discharge status: Home / Self Care | Source: Ambulatory Visit | Attending: Internal Medicine | Admitting: Internal Medicine

## 2023-08-30 DIAGNOSIS — Z1231 Encounter for screening mammogram for malignant neoplasm of breast: Secondary | ICD-10-CM

## 2023-08-31 DIAGNOSIS — K5909 Other constipation: Secondary | ICD-10-CM | POA: Diagnosis not present

## 2023-08-31 DIAGNOSIS — I700 Atherosclerosis of aorta: Secondary | ICD-10-CM | POA: Diagnosis not present

## 2023-08-31 DIAGNOSIS — W19XXXD Unspecified fall, subsequent encounter: Secondary | ICD-10-CM | POA: Diagnosis not present

## 2023-08-31 DIAGNOSIS — I6523 Occlusion and stenosis of bilateral carotid arteries: Secondary | ICD-10-CM | POA: Diagnosis not present

## 2023-08-31 DIAGNOSIS — E7800 Pure hypercholesterolemia, unspecified: Secondary | ICD-10-CM | POA: Diagnosis not present

## 2023-08-31 DIAGNOSIS — I480 Paroxysmal atrial fibrillation: Secondary | ICD-10-CM | POA: Diagnosis not present

## 2023-08-31 DIAGNOSIS — I10 Essential (primary) hypertension: Secondary | ICD-10-CM | POA: Diagnosis not present

## 2023-08-31 DIAGNOSIS — R7303 Prediabetes: Secondary | ICD-10-CM | POA: Diagnosis not present

## 2023-08-31 DIAGNOSIS — S2232XD Fracture of one rib, left side, subsequent encounter for fracture with routine healing: Secondary | ICD-10-CM | POA: Diagnosis not present

## 2023-09-03 DIAGNOSIS — E7800 Pure hypercholesterolemia, unspecified: Secondary | ICD-10-CM | POA: Diagnosis not present

## 2023-09-03 DIAGNOSIS — R7303 Prediabetes: Secondary | ICD-10-CM | POA: Diagnosis not present

## 2023-09-03 DIAGNOSIS — S2232XD Fracture of one rib, left side, subsequent encounter for fracture with routine healing: Secondary | ICD-10-CM | POA: Diagnosis not present

## 2023-09-03 DIAGNOSIS — I480 Paroxysmal atrial fibrillation: Secondary | ICD-10-CM | POA: Diagnosis not present

## 2023-09-03 DIAGNOSIS — I6523 Occlusion and stenosis of bilateral carotid arteries: Secondary | ICD-10-CM | POA: Diagnosis not present

## 2023-09-03 DIAGNOSIS — K5909 Other constipation: Secondary | ICD-10-CM | POA: Diagnosis not present

## 2023-09-03 DIAGNOSIS — I10 Essential (primary) hypertension: Secondary | ICD-10-CM | POA: Diagnosis not present

## 2023-09-03 DIAGNOSIS — W19XXXD Unspecified fall, subsequent encounter: Secondary | ICD-10-CM | POA: Diagnosis not present

## 2023-09-03 DIAGNOSIS — I700 Atherosclerosis of aorta: Secondary | ICD-10-CM | POA: Diagnosis not present

## 2023-09-06 DIAGNOSIS — I700 Atherosclerosis of aorta: Secondary | ICD-10-CM | POA: Diagnosis not present

## 2023-09-06 DIAGNOSIS — R7303 Prediabetes: Secondary | ICD-10-CM | POA: Diagnosis not present

## 2023-09-06 DIAGNOSIS — I6523 Occlusion and stenosis of bilateral carotid arteries: Secondary | ICD-10-CM | POA: Diagnosis not present

## 2023-09-06 DIAGNOSIS — I480 Paroxysmal atrial fibrillation: Secondary | ICD-10-CM | POA: Diagnosis not present

## 2023-09-06 DIAGNOSIS — S2232XD Fracture of one rib, left side, subsequent encounter for fracture with routine healing: Secondary | ICD-10-CM | POA: Diagnosis not present

## 2023-09-06 DIAGNOSIS — W19XXXD Unspecified fall, subsequent encounter: Secondary | ICD-10-CM | POA: Diagnosis not present

## 2023-09-06 DIAGNOSIS — E7800 Pure hypercholesterolemia, unspecified: Secondary | ICD-10-CM | POA: Diagnosis not present

## 2023-09-06 DIAGNOSIS — I10 Essential (primary) hypertension: Secondary | ICD-10-CM | POA: Diagnosis not present

## 2023-09-06 DIAGNOSIS — K5909 Other constipation: Secondary | ICD-10-CM | POA: Diagnosis not present

## 2023-09-10 DIAGNOSIS — K5909 Other constipation: Secondary | ICD-10-CM | POA: Diagnosis not present

## 2023-09-10 DIAGNOSIS — R7303 Prediabetes: Secondary | ICD-10-CM | POA: Diagnosis not present

## 2023-09-10 DIAGNOSIS — I6523 Occlusion and stenosis of bilateral carotid arteries: Secondary | ICD-10-CM | POA: Diagnosis not present

## 2023-09-10 DIAGNOSIS — E7800 Pure hypercholesterolemia, unspecified: Secondary | ICD-10-CM | POA: Diagnosis not present

## 2023-09-10 DIAGNOSIS — I10 Essential (primary) hypertension: Secondary | ICD-10-CM | POA: Diagnosis not present

## 2023-09-10 DIAGNOSIS — I700 Atherosclerosis of aorta: Secondary | ICD-10-CM | POA: Diagnosis not present

## 2023-09-10 DIAGNOSIS — I480 Paroxysmal atrial fibrillation: Secondary | ICD-10-CM | POA: Diagnosis not present

## 2023-09-10 DIAGNOSIS — W19XXXD Unspecified fall, subsequent encounter: Secondary | ICD-10-CM | POA: Diagnosis not present

## 2023-09-10 DIAGNOSIS — S2232XD Fracture of one rib, left side, subsequent encounter for fracture with routine healing: Secondary | ICD-10-CM | POA: Diagnosis not present

## 2023-09-11 DIAGNOSIS — I6523 Occlusion and stenosis of bilateral carotid arteries: Secondary | ICD-10-CM | POA: Diagnosis not present

## 2023-09-11 DIAGNOSIS — I700 Atherosclerosis of aorta: Secondary | ICD-10-CM | POA: Diagnosis not present

## 2023-09-11 DIAGNOSIS — W19XXXD Unspecified fall, subsequent encounter: Secondary | ICD-10-CM | POA: Diagnosis not present

## 2023-09-11 DIAGNOSIS — R7303 Prediabetes: Secondary | ICD-10-CM | POA: Diagnosis not present

## 2023-09-11 DIAGNOSIS — E7800 Pure hypercholesterolemia, unspecified: Secondary | ICD-10-CM | POA: Diagnosis not present

## 2023-09-11 DIAGNOSIS — S2232XD Fracture of one rib, left side, subsequent encounter for fracture with routine healing: Secondary | ICD-10-CM | POA: Diagnosis not present

## 2023-09-11 DIAGNOSIS — K5909 Other constipation: Secondary | ICD-10-CM | POA: Diagnosis not present

## 2023-09-11 DIAGNOSIS — I480 Paroxysmal atrial fibrillation: Secondary | ICD-10-CM | POA: Diagnosis not present

## 2023-09-11 DIAGNOSIS — I10 Essential (primary) hypertension: Secondary | ICD-10-CM | POA: Diagnosis not present

## 2023-10-02 ENCOUNTER — Telehealth: Payer: Self-pay | Admitting: *Deleted

## 2023-10-02 NOTE — Telephone Encounter (Signed)
   Pre-operative Risk Assessment    Patient Name: Terri Ponce  DOB: [birth year] MRN: [id number]   Date of last office visit: 07/17/23 DR. NAHSER Date of next office visit: NONE  Request for Surgical Clearance    Procedure:  Dental Extraction - Amount of Teeth to be Pulled:     Date of Surgery:  Clearance TBD                                Surgeon:  DR. Donnita Ponce, DDS Surgeon's Group or Practice Name: [facility] Phone number:  [phone] Fax number:  [phone]   Type of Clearance Requested:   - Medical  - Pharmacy:  Hold Apixaban  (Eliquis )     Type of Anesthesia:  Local NUMBING   Additional requests/questions:    Terri Ponce   10/02/2023, 1:07 PM

## 2023-10-03 NOTE — Telephone Encounter (Signed)
   Patient Name: Terri Ponce  DOB: [birth year] MRN: [id number]  Primary Cardiologist: Ahmad Alert, MD  Chart reviewed as part of pre-operative protocol coverage.   Simple dental extractions (i.e. 1-2 teeth) are considered low risk procedures per guidelines and generally do not require any specific cardiac clearance. It is also generally accepted that for simple extractions and dental cleanings, there is no need to interrupt blood thinner therapy.   SBE prophylaxis is not required for the patient from a cardiac standpoint.  I will route this recommendation to the requesting party via Epic fax function and remove from pre-op pool.  Please call with questions.  Jude Norton, NP 10/03/2023, 12:05 PM

## 2023-10-03 NOTE — Telephone Encounter (Signed)
 I will update the requesting office the pt has been cleared for dental procedure. See notes from preop APP Marlana Silvan, NP.

## 2023-10-03 NOTE — Telephone Encounter (Signed)
 1 tooth will be pulled : # 14

## 2023-10-09 ENCOUNTER — Telehealth: Payer: Self-pay | Admitting: Home Health

## 2023-10-09 NOTE — Telephone Encounter (Signed)
 Patient's daughter Shelagh Derrick called after hour line, reports her mother Terri Ponce has been feeling her A fib since last night. She normally only takes cardizem  30mg  and Eliquis  5mg  once daily. She took extra Cardizem  and Eliquis  last night and this morning. BP was low at one point but she does not know how low. She checked her BP today and it was 113/73 and HR was 104bpm. She is asking can she take extra cardizem  and Eliquis  tonight.   Explained to the daughter that Eliquis  is intended for CVA prophylaxis, would not affect atrial fibrillation heart rate, patient should be consistently taking it twice daily regardless feeling A-fib or not.   Advised patient's daughter that patient may take extra dose of Cardizem  if she has heart palpitation from atrial fibrillation at 104 bpm, as long as blood pressure is >90/60.  If she experience significant symptoms from A-fib and feeling very poor and BP is low, she needs to go to the ER.Otherwise, she can call the office tomorrow for an appointment.  Daughter verbalized understanding.

## 2023-10-10 ENCOUNTER — Other Ambulatory Visit (HOSPITAL_COMMUNITY): Payer: Self-pay | Admitting: *Deleted

## 2023-10-10 ENCOUNTER — Ambulatory Visit (HOSPITAL_COMMUNITY)
Admission: RE | Admit: 2023-10-10 | Discharge: 2023-10-10 | Disposition: A | Discharge status: Home / Self Care | Source: Ambulatory Visit | Attending: Physician Assistant | Admitting: Physician Assistant

## 2023-10-10 ENCOUNTER — Ambulatory Visit (HOSPITAL_COMMUNITY): Payer: Self-pay | Admitting: Physician Assistant

## 2023-10-10 ENCOUNTER — Encounter (HOSPITAL_COMMUNITY): Payer: Self-pay | Admitting: Physician Assistant

## 2023-10-10 VITALS — BP 112/76 | HR 103 | Ht 67.0 in | Wt 148.2 lb

## 2023-10-10 DIAGNOSIS — D6869 Other thrombophilia: Secondary | ICD-10-CM

## 2023-10-10 DIAGNOSIS — I4819 Other persistent atrial fibrillation: Secondary | ICD-10-CM | POA: Diagnosis not present

## 2023-10-10 DIAGNOSIS — I480 Paroxysmal atrial fibrillation: Secondary | ICD-10-CM | POA: Diagnosis not present

## 2023-10-10 LAB — BASIC METABOLIC PANEL WITH GFR
BUN/Creatinine Ratio: 27 (ref 12–28)
BUN: 18 mg/dL (ref 10–36)
CO2: 27 mmol/L (ref 20–29)
Calcium: 9.9 mg/dL (ref 8.7–10.3)
Chloride: 103 mmol/L (ref 96–106)
Creatinine, Ser: 0.67 mg/dL (ref 0.57–1.00)
Glucose: 103 mg/dL — ABNORMAL HIGH (ref 70–99)
Potassium: 5.1 mmol/L (ref 3.5–5.2)
Sodium: 138 mmol/L (ref 134–144)
eGFR: 82 mL/min/{1.73_m2} (ref >59)

## 2023-10-10 LAB — CBC
Hematocrit: 39.8 % (ref 34.0–46.6)
Hemoglobin: 13 g/dL (ref 11.1–15.9)
MCH: 30.4 pg (ref 26.6–33.0)
MCHC: 32.7 g/dL (ref 31.5–35.7)
MCV: 93 fL (ref 79–97)
Platelets: 309 10*3/uL (ref 150–450)
RBC: 4.28 x10E6/uL (ref 3.77–5.28)
RDW: 15.2 % (ref 11.7–15.4)
WBC: 8.9 10*3/uL (ref 3.4–10.8)

## 2023-10-10 MED ORDER — DILTIAZEM HCL 30 MG PO TABS: ORAL_TABLET | ORAL | 6 refills | Status: DC

## 2023-10-10 NOTE — Patient Instructions (Addendum)
 Eliquis  5mg  twice a day   Cardizem  (Diltiazem ) take 2 (30mg  tablets) twice a day to equal 60mg  twice a day   Call if you go back into normal rhythm before cardioversion   Cardioversion scheduled for: Wednesday, June 11th   - Arrive at the [employer] [facility] ([address]  and check in with ADMITTING at 8:00AM   - Do not eat or drink anything after midnight the night prior to your procedure.   - Take all your morning medication (except diabetic medications) with a sip of water prior to arrival.  - Do NOT miss any doses of your blood thinner - if you should miss a dose or take a dose more than 4 hours late -- please notify our office immediately.  - You will not be able to drive home after your procedure. Please ensure you have a responsible adult to drive you home. You will need someone with you for 24 hours post procedure.     - Expect to be in the procedural area approximately 2 hours.   - If you feel as if you go back into normal rhythm prior to scheduled cardioversion, please notify our office immediately.   If your procedure is canceled in the cardioversion suite you will be charged a cancellation fee.
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
## 2023-10-10 NOTE — Telephone Encounter (Signed)
 Patient identification verified by 2 forms.   Called and spoke to patient's daughter:  Daughter states:  -Her mother has been in  and out of A fib since Monday -Pt is weak -HR is elevated to 108 this morning -Blood pressure 102/73 HR 108, 108/72 HR 108  Patient denies:  -Wanting to go to the ED since her mother is elderly and will have to wait for hours in the ED       Interventions/Plan: -Appt with A. Fib clinic scheduled for today.   Reviewed ED warning signs/precautions  Patient agrees with plan, no questions at this time
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
## 2023-10-10 NOTE — Telephone Encounter (Signed)
 Patient c/o Palpitations:  STAT if patient reporting lightheadedness, shortness of breath, or chest pain  How long have you had palpitations/irregular HR/ Afib? Are you having the symptoms now?   Yes  Are you currently experiencing lightheadedness, SOB or CP?   No  Do you have a history of afib (atrial fibrillation) or irregular heart rhythm?   Yes  Have you checked your BP or HR? (document readings if available):   9:00 am BP 102/73 and 108/72 (a few minutes later HR 108   Are you experiencing any other symptoms?  Weakness, not feeling like herself   Caller stated she is not with the patient at this time.  Caller stated patient HR got up to 104 last night.

## 2023-10-18 ENCOUNTER — Other Ambulatory Visit: Payer: Self-pay | Admitting: Cardiovascular Disease

## 2023-10-18 DIAGNOSIS — I480 Paroxysmal atrial fibrillation: Secondary | ICD-10-CM

## 2023-10-18 NOTE — Telephone Encounter (Signed)
 Prescription refill request for Eliquis  received. Indication: PAF Last office visit: 10/10/23  C Fenton PA Scr: 0.67 on 10/10/23  Epic Age: 89 Weight: 67.2  Based on above findings Eliquis  5mg  twice daily is the appropriate dose.  Refill approved.

## 2023-10-24 ENCOUNTER — Encounter (HOSPITAL_COMMUNITY): Payer: Self-pay | Admitting: *Deleted

## 2023-10-30 DIAGNOSIS — H04123 Dry eye syndrome of bilateral lacrimal glands: Secondary | ICD-10-CM | POA: Diagnosis not present

## 2023-10-30 DIAGNOSIS — H401133 Primary open-angle glaucoma, bilateral, severe stage: Secondary | ICD-10-CM | POA: Diagnosis not present

## 2023-10-30 NOTE — Progress Notes (Signed)
 Attempted to call patient and Pt's daughter regarding pre-procedure instructions. Attempted three different phone numbers with no response or voicemail to leave a message.

## 2023-10-30 NOTE — Anesthesia Preprocedure Evaluation (Signed)
 Anesthesia Evaluation  Patient identified by MRN, date of birth, ID band Patient awake    Reviewed: Allergy & Precautions, NPO status , Patient's Chart, lab work & pertinent test results, reviewed documented beta blocker date and time   History of Anesthesia Complications (+) PONV and history of anesthetic complications  Airway Mallampati: II       Dental no notable dental hx. (+) Dental Advisory Given, Caps, Teeth Intact,    Pulmonary shortness of breath and with exertion, pneumonia, resolved   Pulmonary exam normal breath sounds clear to auscultation       Cardiovascular hypertension, Pt. on medications Normal cardiovascular exam+ dysrhythmias Atrial Fibrillation  Rhythm:Irregular Rate:Normal     Neuro/Psych  PSYCHIATRIC DISORDERS Anxiety Depression    Hearing loss Glaucoma    GI/Hepatic ,GERD  Medicated,,(+) Hepatitis -, A  Endo/Other  negative endocrine ROS  Hx/o left breast Ca S/P lumpectomy + RT  Renal/GU negative Renal ROS  negative genitourinary   Musculoskeletal  (+) Arthritis , Osteoarthritis,    Abdominal   Peds  Hematology  (+) Blood dyscrasia, anemia Eliquis  therapy- last dose this am    Anesthesia Other Findings   Reproductive/Obstetrics                             Anesthesia Physical Anesthesia Plan  ASA: 3  Anesthesia Plan: General   Post-op Pain Management: Minimal or no pain anticipated   Induction: Intravenous  PONV Risk Score and Plan: 3 and Treatment may vary due to age or medical condition  Airway Management Planned: Natural Airway and Nasal Cannula  Additional Equipment: None  Intra-op Plan:   Post-operative Plan:   Informed Consent: I have reviewed the patients History and Physical, chart, labs and discussed the procedure including the risks, benefits and alternatives for the proposed anesthesia with the patient or authorized representative who has  indicated his/her understanding and acceptance.     Dental advisory given  Plan Discussed with: CRNA and Anesthesiologist  Anesthesia Plan Comments:         Anesthesia Quick Evaluation

## 2023-10-31 ENCOUNTER — Ambulatory Visit (HOSPITAL_COMMUNITY): Payer: Self-pay | Admitting: Anesthesiology

## 2023-10-31 ENCOUNTER — Other Ambulatory Visit: Payer: Self-pay

## 2023-10-31 ENCOUNTER — Ambulatory Visit (HOSPITAL_COMMUNITY)
Admission: RE | Admit: 2023-10-31 | Discharge: 2023-10-31 | Disposition: A | Discharge status: Home / Self Care | Attending: Cardiovascular Disease | Admitting: Cardiovascular Disease

## 2023-10-31 ENCOUNTER — Encounter (HOSPITAL_COMMUNITY): Payer: Self-pay | Admitting: Cardiovascular Disease

## 2023-10-31 ENCOUNTER — Encounter (HOSPITAL_COMMUNITY)
Admission: RE | Disposition: A | Discharge status: Home / Self Care | Payer: Self-pay | Source: Home / Self Care | Attending: Cardiovascular Disease

## 2023-10-31 DIAGNOSIS — F418 Other specified anxiety disorders: Secondary | ICD-10-CM

## 2023-10-31 DIAGNOSIS — Z7901 Long term (current) use of anticoagulants: Secondary | ICD-10-CM | POA: Insufficient documentation

## 2023-10-31 DIAGNOSIS — I10 Essential (primary) hypertension: Secondary | ICD-10-CM | POA: Diagnosis not present

## 2023-10-31 DIAGNOSIS — Z79899 Other long term (current) drug therapy: Secondary | ICD-10-CM | POA: Insufficient documentation

## 2023-10-31 DIAGNOSIS — I4819 Other persistent atrial fibrillation: Secondary | ICD-10-CM | POA: Diagnosis not present

## 2023-10-31 DIAGNOSIS — D6869 Other thrombophilia: Secondary | ICD-10-CM | POA: Diagnosis not present

## 2023-10-31 HISTORY — PX: CARDIOVERSION: EP1203

## 2023-10-31 SURGERY — CARDIOVERSION (CATH LAB)
Anesthesia: General

## 2023-10-31 MED ORDER — PROPOFOL 10 MG/ML IV BOLUS
INTRAVENOUS | Status: DC | PRN
  Administered 2023-10-31: 40 mg via INTRAVENOUS

## 2023-10-31 MED ORDER — LIDOCAINE 2% (20 MG/ML) 5 ML SYRINGE
INTRAMUSCULAR | Status: DC | PRN
  Administered 2023-10-31: 50 mg via INTRAVENOUS

## 2023-10-31 MED ORDER — SODIUM CHLORIDE 0.9% FLUSH
INTRAVENOUS | Status: DC | PRN
Start: 2023-10-31 — End: 2023-10-31
  Administered 2023-10-31: 10 mL via INTRAVENOUS

## 2023-10-31 SURGICAL SUPPLY — 1 items: PAD DEFIB RADIO PHYSIO CONN (PAD) ×1 IMPLANT

## 2023-10-31 NOTE — CV Procedure (Signed)
 Electrical Cardioversion Procedure Note Terri Ponce [id number] [birth year]  Procedure: Electrical Cardioversion Indications:  Atrial Fibrillation  Procedure Details Consent: Risks of procedure as well as the alternatives and risks of each were explained to the (patient/caregiver).  Consent for procedure obtained. Time Out: Verified patient identification, verified procedure, site/side was marked, verified correct patient position, special equipment/implants available, medications/allergies/relevent history reviewed, required imaging and test results available.  Performed  Patient placed on cardiac monitor, pulse oximetry, supplemental oxygen as necessary.  Sedation given: propofol  Pacer pads placed anterior and posterior chest.  Cardioverted 1 time(s).  Cardioverted at 200J.  Evaluation Findings: Post procedure EKG shows: NSR Complications: None Patient did tolerate procedure well.   Maudine Sos, MD 10/31/2023, 9:02 AM

## 2023-10-31 NOTE — Interval H&P Note (Signed)
 History and Physical Interval Note:  10/31/2023 8:36 AM  Terri Ponce  has presented today for surgery, with the diagnosis of AFIB.  The various methods of treatment have been discussed with the patient and family. After consideration of risks, benefits and other options for treatment, the patient has consented to  Procedure(s): CARDIOVERSION (N/A) as a surgical intervention.  The patient's history has been reviewed, patient examined, no change in status, stable for surgery.  I have reviewed the patient's chart and labs.  Questions were answered to the patient's satisfaction.     Maudine Sos, MD

## 2023-10-31 NOTE — Transfer of Care (Signed)
 Immediate Anesthesia Transfer of Care Note  Patient: Terri Ponce  Procedure(s) Performed: CARDIOVERSION  Patient Location: PACU and Endoscopy Unit  Anesthesia Type:MAC  Level of Consciousness: awake and alert   Airway & Oxygen Therapy: Patient Spontanous Breathing and Patient connected to nasal cannula oxygen  Post-op Assessment: Report given to RN and Post -op Vital signs reviewed and stable  Post vital signs: Reviewed and stable  Last Vitals:  Vitals Value Taken Time  BP 102/58 10/31/23 0905  Temp    Pulse 64 10/31/23 0905  Resp 13 10/31/23 0905  SpO2 97 % 10/31/23 0905  Vitals shown include unfiled device data.  Last Pain:  Vitals:   10/31/23 0854  TempSrc:   PainSc: 0-No pain         Complications: No notable events documented.

## 2023-10-31 NOTE — Anesthesia Postprocedure Evaluation (Signed)
 Anesthesia Post Note  Patient: Terri Ponce  Procedure(s) Performed: CARDIOVERSION     Patient location during evaluation: PACU Anesthesia Type: General Level of consciousness: awake and alert and oriented Pain management: pain level controlled Vital Signs Assessment: post-procedure vital signs reviewed and stable Respiratory status: spontaneous breathing, nonlabored ventilation and respiratory function stable Cardiovascular status: blood pressure returned to baseline and stable Postop Assessment: no apparent nausea or vomiting Anesthetic complications: no   No notable events documented.  Last Vitals:  Vitals:   10/31/23 0920 10/31/23 0930  BP: (!) 96/54 (!) 105/53  Pulse: (!) 58 (!) 55  Resp: (!) 24 (!) 23  Temp:    SpO2: 99% 99%    Last Pain:  Vitals:   10/31/23 0907  TempSrc: Temporal  PainSc: 0-No pain                 Aidyn Sportsman A.

## 2023-11-08 NOTE — Telephone Encounter (Signed)
 Called requesting office wasn't able to talk to anyone left a detailed message to call us  back

## 2023-11-08 NOTE — Telephone Encounter (Signed)
 They are calling back per pt's daughters request about holding Eliquis  because pt is back in afib.  Procedure was scheduled today but daughter cx appt until they hear back from us  because of her being back in afib

## 2023-11-09 NOTE — Telephone Encounter (Signed)
 Caller Renay Carota) returned Pre-op call and noted they will close at 1:00 pm today.  Caller noted patient may be scheduled for surgery next week sometime.  Caller stated can fax response to fax# [phone].

## 2023-11-09 NOTE — Telephone Encounter (Signed)
 Pt has appt with A-fib clinic 11/14/23 will route to APP for Review and will update surgeons office.

## 2023-11-14 ENCOUNTER — Encounter (HOSPITAL_COMMUNITY): Payer: Self-pay | Admitting: Physician Assistant

## 2023-11-14 ENCOUNTER — Encounter: Payer: Self-pay | Admitting: Cardiovascular Disease

## 2023-11-14 ENCOUNTER — Ambulatory Visit (HOSPITAL_COMMUNITY)
Admission: RE | Admit: 2023-11-14 | Discharge: 2023-11-14 | Disposition: A | Discharge status: Home / Self Care | Source: Ambulatory Visit | Attending: Physician Assistant | Admitting: Physician Assistant

## 2023-11-14 VITALS — BP 116/74 | HR 74 | Ht 67.0 in | Wt 148.8 lb

## 2023-11-14 DIAGNOSIS — D6869 Other thrombophilia: Secondary | ICD-10-CM | POA: Diagnosis not present

## 2023-11-14 DIAGNOSIS — I480 Paroxysmal atrial fibrillation: Secondary | ICD-10-CM

## 2023-11-14 DIAGNOSIS — I4819 Other persistent atrial fibrillation: Secondary | ICD-10-CM | POA: Diagnosis not present
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
## 2023-11-14 NOTE — Patient Instructions (Signed)
 Patient cannot stop Eliquis  until 11/28/2023 after that date patient ok to hold Eliquis  for tooth extraction per Quita Kicks, PA-C

## 2023-11-15 NOTE — Telephone Encounter (Signed)
 S/w requesting office. They stated that nothing has changed with her clearance. They wanted to confirm that she does not need to hold her Eliquis ? Her procedure is scheduled for 11/30/23.

## 2023-11-19 NOTE — Telephone Encounter (Signed)
 I have added these notes to clearance request and have sent to preop APP for review if the pt has been cleared.

## 2023-11-19 NOTE — Telephone Encounter (Signed)
 I will send back to preop to review if pt has been cleared. The dental procedure has not changed, still the same as the request that came in.   Janit Geni CROME, RN routed this conversation to West, Katlyn D, NP  Cv Div Preop Callback (Selected Message) Ronal CROME Vicci Ronal Jama to P Cv Div [facility] Triage (supporting Aleene JINNY Passe, MD)     11/17/23 12:11 PM It's for the removal of a single tooth, the same tooth that she was going to have extracted in May. She was not in afib when she saw the oral surgeon. We postponed the extraction because her afib flared up. Then she had a cardioversion, which was successful for a week, until the tooth got infected. The afib returned at the same time. Week before last, Rehm's office was supposed to have faxed you a request re: extracting the tooth while she is in afib. Appreciate your info. West, Katlyn D, NP to [facility] Khia Dieterich     11/15/23  8:33 AM Hi Ms. Nolasco,   Is this for the removal of a single tooth or multiple teeth? Reviewing her chart we received a request in May for a single tooth extraction but have not received any further requests.    Katlyn West, NP   Last read by Ronal CROME Vicci Ronal Jama at [phone] on 11/17/2023.   11/14/23  2:52 PM Davee Comer CROME, RN routed this conversation to Cv Div Preop Ronal CROME Vicci Ronal Jama to P [facility] Triage (supporting Aleene JINNY Passe, MD)     11/14/23  2:46 PM Since 11/08/23, when my mom Jennelle Pinkstaff), complained of a severe toothache AND the return of  Afib after cardioversion on 10/31/23, I've been asking the office of the oral surgeon who is going to remove the troublesome tooth, Dr. Glendia Lloyd, to contact you for advice on how to manage Eliquis  and the tooth extraction while she's in Afib. His office said on 6/19, 6/20 and 6/22 that they would send you a request for advice/permission. As of today, when we saw PA Daril Kicks in the [facility] Afib clinic, we had heard nothing from the  oral surgeon, and Fenton's office has no record of a request from Rehm's office. Have you received the request from Rehm's office? FYI, we made a plan with Fenton today, which is to continue Eliquis  for 4 weeks after cardioversion before pullling the tooth, which puts the extraction at July 9 or after. I just re-scheduled the extraction for July 11, at which point we will hold my mom's Eliquis  for (???how long???) and restart after extraction. i just want to make sure everyone is on the same page.  At some point, I believe that Rehm's office is looking for clearance and advice from your office. Please confirm & make sure that Dr. Vina Gull is in the loop. She will be my mom's cardiologist after Dr. Passe lucky 6/30. Taitum Menton, [phone] [facility]'s daughter)

## 2023-11-19 NOTE — Telephone Encounter (Signed)
   Patient Name: Terri Ponce  DOB: Jan 15, 1929 MRN: [id number]  Primary Cardiologist: Aleene Passe, MD  Chart reviewed as part of pre-operative protocol coverage.   Simple dental extractions (i.e. 1-2 teeth) are considered low risk procedures per guidelines and generally do not require any specific cardiac clearance. It is also generally accepted that for simple extractions and dental cleanings, there is no need to interrupt blood thinner therapy.   SBE prophylaxis is not required for the patient from a cardiac standpoint.  I will route this recommendation to the requesting party via Epic fax function and remove from pre-op pool.  Please call with questions.  Wyn Raddle, Jackee Shove, NP 11/19/2023, 10:50 AM

## 2023-11-22 ENCOUNTER — Encounter: Payer: Self-pay | Admitting: Cardiovascular Disease

## 2023-11-26 ENCOUNTER — Telehealth: Payer: Self-pay

## 2023-11-26 NOTE — Telephone Encounter (Signed)
   Pre-operative Risk Assessment    Patient Name: Terri Ponce  DOB: 1929/01/25 MRN: [id number]   Date of last office visit: 07/17/23 DR ALVETA Date of next office visit: NONE  Request for Surgical Clearance    Procedure:  Dental Extraction - Amount of Teeth to be Pulled:  1  Date of Surgery:  Clearance 11/30/23                                Surgeon:  KRYSTAL KANDICE MUSLIM, MD Surgeon's Group or Practice Name:  [facility] Phone number:  [phone] Fax number:  [phone]   Type of Clearance Requested:   - Medical  - Pharmacy:  Hold Apixaban  (Eliquis ) NOT INDICATED   Type of Anesthesia:  IV SEDATION   Additional requests/questions:    Signed, Sebasthian Stailey   11/26/2023, 9:43 AM

## 2023-11-26 NOTE — Telephone Encounter (Signed)
   Patient Name: Terri Ponce  DOB: 1929/05/20 MRN: [id number]  Primary Cardiologist: Aleene Passe, MD  Chart reviewed as part of pre-operative protocol coverage.   Simple dental extractions (i.e. 1-2 teeth) are considered low risk procedures per guidelines and generally do not require any specific cardiac clearance. It is also generally accepted that for simple extractions and dental cleanings, there is no need to interrupt blood thinner therapy.   SBE prophylaxis is not required for the patient from a cardiac standpoint.  I will route this recommendation to the requesting party via Epic fax function and remove from pre-op pool.  Please call with questions.  Damien JAYSON Braver, NP 11/26/2023, 11:07 AM

## 2023-12-10 ENCOUNTER — Ambulatory Visit (HOSPITAL_COMMUNITY)
Admission: RE | Admit: 2023-12-10 | Discharge: 2023-12-10 | Disposition: A | Discharge status: Home / Self Care | Source: Ambulatory Visit | Attending: Physician Assistant | Admitting: Physician Assistant

## 2023-12-10 DIAGNOSIS — I10 Essential (primary) hypertension: Secondary | ICD-10-CM | POA: Diagnosis not present

## 2023-12-10 DIAGNOSIS — I480 Paroxysmal atrial fibrillation: Secondary | ICD-10-CM | POA: Insufficient documentation

## 2023-12-10 DIAGNOSIS — D6869 Other thrombophilia: Secondary | ICD-10-CM | POA: Diagnosis not present

## 2023-12-10 DIAGNOSIS — I083 Combined rheumatic disorders of mitral, aortic and tricuspid valves: Secondary | ICD-10-CM | POA: Insufficient documentation

## 2023-12-10 LAB — ECHOCARDIOGRAM COMPLETE
Area-P 1/2: 4.74 cm2
Calc EF: 57.7 %
S' Lateral: 2.4 cm
Single Plane A2C EF: 58.4 %
Single Plane A4C EF: 58.1 %

## 2023-12-11 ENCOUNTER — Ambulatory Visit (HOSPITAL_COMMUNITY): Payer: Self-pay | Admitting: Physician Assistant

## 2023-12-20 DIAGNOSIS — B351 Tinea unguium: Secondary | ICD-10-CM | POA: Diagnosis not present

## 2023-12-20 DIAGNOSIS — L218 Other seborrheic dermatitis: Secondary | ICD-10-CM | POA: Diagnosis not present

## 2023-12-20 DIAGNOSIS — L814 Other melanin hyperpigmentation: Secondary | ICD-10-CM | POA: Diagnosis not present

## 2023-12-20 DIAGNOSIS — D225 Melanocytic nevi of trunk: Secondary | ICD-10-CM | POA: Diagnosis not present

## 2023-12-20 DIAGNOSIS — L821 Other seborrheic keratosis: Secondary | ICD-10-CM | POA: Diagnosis not present

## 2023-12-28 ENCOUNTER — Ambulatory Visit (HOSPITAL_COMMUNITY)
Admission: RE | Admit: 2023-12-28 | Discharge: 2023-12-28 | Disposition: A | Discharge status: Home / Self Care | Source: Ambulatory Visit | Attending: Physician Assistant | Admitting: Physician Assistant

## 2023-12-28 ENCOUNTER — Encounter (HOSPITAL_COMMUNITY): Payer: Self-pay | Admitting: Physician Assistant

## 2023-12-28 VITALS — BP 140/74 | HR 97 | Ht 67.0 in | Wt 147.2 lb

## 2023-12-28 DIAGNOSIS — I4819 Other persistent atrial fibrillation: Secondary | ICD-10-CM | POA: Diagnosis not present

## 2023-12-28 DIAGNOSIS — I480 Paroxysmal atrial fibrillation: Secondary | ICD-10-CM | POA: Diagnosis not present

## 2023-12-28 DIAGNOSIS — D6869 Other thrombophilia: Secondary | ICD-10-CM

## 2023-12-28 MED ORDER — AMIODARONE HCL 200 MG PO TABS: ORAL_TABLET | ORAL | 3 refills | Status: DC
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
## 2023-12-28 NOTE — Patient Instructions (Addendum)
 Start Amiodarone  200mg  twice a day through 01/26/24 then will reduce to once a day - take with food

## 2023-12-29 LAB — TSH: TSH: 2.3 u[IU]/mL (ref 0.450–4.500)

## 2023-12-31 ENCOUNTER — Ambulatory Visit (HOSPITAL_COMMUNITY): Payer: Self-pay | Admitting: Physician Assistant

## 2024-01-02 ENCOUNTER — Other Ambulatory Visit (HOSPITAL_COMMUNITY): Payer: Self-pay | Admitting: *Deleted

## 2024-01-02 MED ORDER — DILTIAZEM HCL 30 MG PO TABS: 60.0000 mg | ORAL_TABLET | Freq: Two times a day (BID) | ORAL | 2 refills | Status: DC

## 2024-01-18 ENCOUNTER — Ambulatory Visit (HOSPITAL_COMMUNITY)
Admission: RE | Admit: 2024-01-18 | Discharge: 2024-01-18 | Disposition: A | Discharge status: Home / Self Care | Source: Ambulatory Visit | Attending: Physician Assistant | Admitting: Physician Assistant

## 2024-01-18 VITALS — BP 124/58 | HR 55 | Ht 67.0 in | Wt 149.4 lb

## 2024-01-18 DIAGNOSIS — I4891 Unspecified atrial fibrillation: Secondary | ICD-10-CM

## 2024-01-18 DIAGNOSIS — I480 Paroxysmal atrial fibrillation: Secondary | ICD-10-CM

## 2024-01-18 DIAGNOSIS — I4819 Other persistent atrial fibrillation: Secondary | ICD-10-CM

## 2024-01-18 DIAGNOSIS — Z5181 Encounter for therapeutic drug level monitoring: Secondary | ICD-10-CM | POA: Diagnosis not present

## 2024-01-18 DIAGNOSIS — Z79899 Other long term (current) drug therapy: Secondary | ICD-10-CM | POA: Diagnosis not present

## 2024-01-18 DIAGNOSIS — D6869 Other thrombophilia: Secondary | ICD-10-CM | POA: Diagnosis not present

## 2024-01-18 MED ORDER — DILTIAZEM HCL 30 MG PO TABS: 60.0000 mg | ORAL_TABLET | Freq: Two times a day (BID) | ORAL | 3 refills | Status: DC

## 2024-01-18 MED ORDER — AMIODARONE HCL 200 MG PO TABS: 200.0000 mg | ORAL_TABLET | Freq: Every day | ORAL | 6 refills | Status: AC

## 2024-01-18 MED ORDER — APIXABAN 5 MG PO TABS: 5.0000 mg | ORAL_TABLET | Freq: Two times a day (BID) | ORAL | 1 refills | Status: AC

## 2024-01-18 NOTE — Patient Instructions (Signed)
Decrease amiodarone to 200mg once a day 
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
## 2024-02-05 DIAGNOSIS — H524 Presbyopia: Secondary | ICD-10-CM | POA: Diagnosis not present

## 2024-02-05 DIAGNOSIS — H401113 Primary open-angle glaucoma, right eye, severe stage: Secondary | ICD-10-CM | POA: Diagnosis not present

## 2024-02-05 DIAGNOSIS — H2512 Age-related nuclear cataract, left eye: Secondary | ICD-10-CM | POA: Diagnosis not present

## 2024-04-25 ENCOUNTER — Ambulatory Visit (HOSPITAL_COMMUNITY)
Admission: RE | Admit: 2024-04-25 | Discharge: 2024-04-25 | Disposition: A | Source: Ambulatory Visit | Attending: Physician Assistant | Admitting: Physician Assistant

## 2024-04-25 ENCOUNTER — Other Ambulatory Visit (HOSPITAL_COMMUNITY): Payer: Self-pay

## 2024-04-25 VITALS — BP 122/44 | HR 57 | Ht 67.0 in | Wt 146.4 lb

## 2024-04-25 DIAGNOSIS — D6869 Other thrombophilia: Secondary | ICD-10-CM | POA: Diagnosis not present

## 2024-04-25 DIAGNOSIS — Z79899 Other long term (current) drug therapy: Secondary | ICD-10-CM | POA: Diagnosis not present

## 2024-04-25 DIAGNOSIS — Z5181 Encounter for therapeutic drug level monitoring: Secondary | ICD-10-CM | POA: Diagnosis not present

## 2024-04-25 DIAGNOSIS — I4819 Other persistent atrial fibrillation: Secondary | ICD-10-CM

## 2024-04-25 DIAGNOSIS — I4891 Unspecified atrial fibrillation: Secondary | ICD-10-CM

## 2024-04-25 MED ORDER — FLUZONE HIGH-DOSE 0.5 ML IM SUSY
0.5000 mL | PREFILLED_SYRINGE | Freq: Once | INTRAMUSCULAR | 0 refills | Status: AC
  Filled 2024-04-25: qty 0.5, 1d supply, fill #0

## 2024-04-25 MED ORDER — DILTIAZEM HCL 30 MG PO TABS: 30.0000 mg | ORAL_TABLET | Freq: Two times a day (BID) | ORAL | Status: AC

## 2024-04-25 NOTE — Patient Instructions (Signed)
 Decrease diltiazem  30 mg twice a day
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
## 2024-04-26 LAB — COMPREHENSIVE METABOLIC PANEL WITH GFR
ALT: 14 IU/L (ref 0–32)
AST: 19 IU/L (ref 0–40)
Albumin: 4.1 g/dL (ref 3.6–4.6)
Alkaline Phosphatase: 92 IU/L (ref 48–129)
BUN/Creatinine Ratio: 16 (ref 12–28)
BUN: 18 mg/dL (ref 10–36)
Bilirubin Total: 0.5 mg/dL (ref 0.0–1.2)
CO2: 25 mmol/L (ref 20–29)
Calcium: 9.7 mg/dL (ref 8.7–10.3)
Chloride: 101 mmol/L (ref 96–106)
Creatinine, Ser: 1.12 mg/dL — ABNORMAL HIGH (ref 0.57–1.00)
Globulin, Total: 1.9 g/dL (ref 1.5–4.5)
Glucose: 80 mg/dL (ref 70–99)
Potassium: 4.6 mmol/L (ref 3.5–5.2)
Sodium: 144 mmol/L (ref 134–144)
Total Protein: 6 g/dL (ref 6.0–8.5)
eGFR: 46 mL/min/1.73 — ABNORMAL LOW (ref >59)

## 2024-04-26 LAB — TSH: TSH: 3.28 u[IU]/mL (ref 0.450–4.500)

## 2024-04-28 ENCOUNTER — Ambulatory Visit (HOSPITAL_COMMUNITY): Payer: Self-pay | Admitting: Physician Assistant

## 2024-05-06 ENCOUNTER — Encounter: Payer: Self-pay | Admitting: Internal Medicine

## 2024-05-06 ENCOUNTER — Ambulatory Visit: Attending: Internal Medicine | Admitting: Internal Medicine

## 2024-05-06 VITALS — BP 140/50 | HR 57 | Ht 66.0 in | Wt 150.0 lb

## 2024-05-06 DIAGNOSIS — Z79899 Other long term (current) drug therapy: Secondary | ICD-10-CM

## 2024-05-06 DIAGNOSIS — I480 Paroxysmal atrial fibrillation: Secondary | ICD-10-CM

## 2024-05-06 DIAGNOSIS — Z558 Other problems related to education and literacy: Secondary | ICD-10-CM
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
## 2024-05-06 NOTE — Patient Instructions (Signed)
 Medication Instructions:  Your physician recommends that you continue on your current medications as directed. Please refer to the Current Medication list given to you today.  *If you need a refill on your cardiac medications before your next appointment, please call your pharmacy*  Lab Work: CBC If you have labs (blood work) drawn today and your tests are completely normal, you will receive your results only by: MyChart Message (if you have MyChart) OR A paper copy in the mail If you have any lab test that is abnormal or we need to change your treatment, we will call you to review the results.  Testing/Procedures: None ordered.   Follow-Up: At [facility], you and your health needs are our priority.  As part of our continuing mission to provide you with exceptional heart care, our providers are all part of one team.  This team includes your primary Cardiologist (physician) and Advanced Practice Providers or APPs (Physician Assistants and Nurse Practitioners) who all work together to provide you with the care you need, when you need it.  Your next appointment:   April 2026 with Dr Okey  We recommend signing up for the patient portal called MyChart.  Sign up information is provided on this After Visit Summary.  MyChart is used to connect with patients for Virtual Visits (Telemedicine).  Patients are able to view lab/test results, encounter notes, upcoming appointments, etc.  Non-urgent messages can be sent to your provider as well.   To learn more about what you can do with MyChart, go to [email or web address].   Other Instructions Referred to Balance and Vestibular Rehab

## 2024-05-19 ENCOUNTER — Other Ambulatory Visit: Payer: Self-pay

## 2024-05-19 ENCOUNTER — Ambulatory Visit: Admitting: Physical Therapy

## 2024-05-19 ENCOUNTER — Encounter: Payer: Self-pay | Admitting: Physical Therapy

## 2024-05-19 DIAGNOSIS — I480 Paroxysmal atrial fibrillation: Secondary | ICD-10-CM | POA: Insufficient documentation

## 2024-05-19 DIAGNOSIS — Z79899 Other long term (current) drug therapy: Secondary | ICD-10-CM | POA: Diagnosis not present

## 2024-05-19 DIAGNOSIS — R2689 Other abnormalities of gait and mobility: Secondary | ICD-10-CM | POA: Diagnosis present

## 2024-05-19 DIAGNOSIS — Z558 Other problems related to education and literacy: Secondary | ICD-10-CM | POA: Diagnosis not present

## 2024-05-19 DIAGNOSIS — M6281 Muscle weakness (generalized): Secondary | ICD-10-CM | POA: Diagnosis present

## 2024-05-19 DIAGNOSIS — R2681 Unsteadiness on feet: Secondary | ICD-10-CM | POA: Insufficient documentation
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
## 2024-05-26 ENCOUNTER — Ambulatory Visit: Attending: Internal Medicine | Admitting: Physical Therapy

## 2024-05-26 ENCOUNTER — Encounter: Payer: Self-pay | Admitting: Physical Therapy

## 2024-05-26 VITALS — BP 135/48 | HR 51

## 2024-05-26 DIAGNOSIS — M6281 Muscle weakness (generalized): Secondary | ICD-10-CM | POA: Diagnosis present

## 2024-05-26 DIAGNOSIS — R2689 Other abnormalities of gait and mobility: Secondary | ICD-10-CM | POA: Insufficient documentation

## 2024-05-26 DIAGNOSIS — R2681 Unsteadiness on feet: Secondary | ICD-10-CM | POA: Diagnosis present

## 2024-05-26 NOTE — Patient Instructions (Signed)
 Access Code: [email or web address] URL: [email or web address] Date: 05/26/2024 Prepared by: Daved Bull  Exercises - Sit to Stand with Armchair  - 1 x daily - 7 x weekly - 2 sets - 10 reps - Seated Hamstring Stretch  - 1 x daily - 4 x weekly - 1 sets - 2 reps - 30 sec hold - Seated Sciatic Nerve Glide With Cervical Motion  - 1 x daily - 4 x weekly - 1-2 sets - 20 reps - Seated Gastroc Stretch with Strap  - 1 x daily - 4 x weekly - 1 sets - 2 reps - 45 seconds hold
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
## 2024-05-30 ENCOUNTER — Ambulatory Visit: Admitting: Physical Therapy

## 2024-05-30 ENCOUNTER — Encounter: Payer: Self-pay | Admitting: Physical Therapy

## 2024-05-30 DIAGNOSIS — M6281 Muscle weakness (generalized): Secondary | ICD-10-CM

## 2024-05-30 DIAGNOSIS — R2689 Other abnormalities of gait and mobility: Secondary | ICD-10-CM | POA: Diagnosis not present

## 2024-05-30 DIAGNOSIS — R2681 Unsteadiness on feet: Secondary | ICD-10-CM
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
## 2024-06-02 ENCOUNTER — Ambulatory Visit: Admitting: Physical Therapy

## 2024-06-04 ENCOUNTER — Ambulatory Visit: Admitting: Physical Therapy

## 2024-06-04 ENCOUNTER — Encounter: Payer: Self-pay | Admitting: Physical Therapy

## 2024-06-04 VITALS — BP 169/47 | HR 54

## 2024-06-04 DIAGNOSIS — R2681 Unsteadiness on feet: Secondary | ICD-10-CM

## 2024-06-04 DIAGNOSIS — R2689 Other abnormalities of gait and mobility: Secondary | ICD-10-CM | POA: Diagnosis not present

## 2024-06-04 DIAGNOSIS — M6281 Muscle weakness (generalized): Secondary | ICD-10-CM
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
## 2024-06-06 ENCOUNTER — Encounter: Payer: Self-pay | Admitting: Physical Therapy

## 2024-06-06 ENCOUNTER — Ambulatory Visit: Admitting: Physical Therapy

## 2024-06-06 VITALS — BP 155/52 | HR 54

## 2024-06-06 DIAGNOSIS — R2681 Unsteadiness on feet: Secondary | ICD-10-CM

## 2024-06-06 DIAGNOSIS — R2689 Other abnormalities of gait and mobility: Secondary | ICD-10-CM | POA: Diagnosis not present

## 2024-06-06 DIAGNOSIS — M6281 Muscle weakness (generalized): Secondary | ICD-10-CM
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
## 2024-06-09 ENCOUNTER — Ambulatory Visit: Admitting: Physical Therapy

## 2024-06-11 ENCOUNTER — Encounter: Payer: Self-pay | Admitting: Physical Therapy

## 2024-06-11 ENCOUNTER — Ambulatory Visit: Admitting: Physical Therapy

## 2024-06-11 VITALS — BP 160/60 | HR 48

## 2024-06-11 DIAGNOSIS — M6281 Muscle weakness (generalized): Secondary | ICD-10-CM

## 2024-06-11 DIAGNOSIS — R2689 Other abnormalities of gait and mobility: Secondary | ICD-10-CM | POA: Diagnosis not present

## 2024-06-11 DIAGNOSIS — R2681 Unsteadiness on feet: Secondary | ICD-10-CM
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
## 2024-06-13 ENCOUNTER — Telehealth: Payer: Self-pay | Admitting: Physician Assistant

## 2024-06-13 ENCOUNTER — Encounter: Payer: Self-pay | Admitting: Physical Therapy

## 2024-06-13 ENCOUNTER — Ambulatory Visit: Admitting: Physical Therapy

## 2024-06-13 VITALS — BP 137/40 | HR 50

## 2024-06-13 DIAGNOSIS — R2689 Other abnormalities of gait and mobility: Secondary | ICD-10-CM | POA: Diagnosis not present

## 2024-06-13 DIAGNOSIS — R2681 Unsteadiness on feet: Secondary | ICD-10-CM

## 2024-06-13 DIAGNOSIS — M6281 Muscle weakness (generalized): Secondary | ICD-10-CM
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
## 2024-06-13 NOTE — Telephone Encounter (Signed)
" ° °  The patient's [name] called the answering service after-hours today. Hx of afib, borderline HTN, LE edema. Diltiazem  was cut back to once a day at 05/06/24 OV due to unsteadiness and good rates. BP 140/50. Diastolic tends to run low occasionally. Since cutting down diltiazem , BP had been running 170s/80s which they feel she has tolerated well, but remains unsteady with her feet. She has been attending PT. Per [name], today the PT mentioned that diastolic was in the 40s (unclear systolic). PT recommended having a salty snack and pushing fluids which she did. Subsequent blood pressures have been elevated in the systolic range - 822/53, 190/92, 196/91. [name] went over there and rechecked patient herself and got 181/80. All of these were checked fairly close together. The patient is 100% asymptomatic. At this juncture I think that proceeding to the ED for monitoring of labile blood pressures would be of more risk to the patient at her advanced age with the current high flu activity especially since she is asymptomatic. Would also be hesitant to aggressively treat given patient is unstable on her feet and tends to swing with low diastolics.We did discuss warning signs for seeking care and being evaluated for persistent severe deviations but for now they will avoid further salty foods and follow blood pressure over the weekend. They will submit a blood pressure log to Dr. Okey via Mychart in the early part of the week. Also discussed how to check blood pressure accurately and not checking too close back to back. [name] verbalized understanding and gratitude.   Marionna Gonia N Nashonda Limberg, PA-C  "

## 2024-06-17 ENCOUNTER — Encounter: Payer: Self-pay | Admitting: Physical Therapy

## 2024-06-17 ENCOUNTER — Ambulatory Visit: Admitting: Physical Therapy

## 2024-06-17 VITALS — BP 166/45 | HR 49

## 2024-06-17 DIAGNOSIS — R2689 Other abnormalities of gait and mobility: Secondary | ICD-10-CM

## 2024-06-17 DIAGNOSIS — R2681 Unsteadiness on feet: Secondary | ICD-10-CM

## 2024-06-17 DIAGNOSIS — M6281 Muscle weakness (generalized): Secondary | ICD-10-CM
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
## 2024-06-20 ENCOUNTER — Encounter: Payer: Self-pay | Admitting: Physical Therapy

## 2024-06-20 ENCOUNTER — Ambulatory Visit: Admitting: Physical Therapy

## 2024-06-20 VITALS — BP 169/42 | HR 56

## 2024-06-20 DIAGNOSIS — R2689 Other abnormalities of gait and mobility: Secondary | ICD-10-CM | POA: Diagnosis not present

## 2024-06-20 DIAGNOSIS — R2681 Unsteadiness on feet: Secondary | ICD-10-CM

## 2024-06-20 DIAGNOSIS — M6281 Muscle weakness (generalized): Secondary | ICD-10-CM

## 2024-06-20 NOTE — Patient Instructions (Addendum)
-   Heel Toe Raises with Counter Support  - 1 x daily - 4 x weekly - 1-2 sets - 10 reps - Tandem Walking with Counter Support  - 1 x daily - 4 x weekly - 3 sets - 10 reps - Backward Walking with Counter Support  - 1 x daily - 4 x weekly - 3 sets - 10 reps - Side Stepping with Counter Support  - 1 x daily - 4 x weekly - 3 sets - 10 reps - added red band at thighs - Standing March with Counter Support  - 1 x daily - 4 x weekly - 2 sets - 10 reps  Chair yoga poses 1,2, 5, 6, 8, 9, 10,11
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
## 2024-06-23 ENCOUNTER — Ambulatory Visit: Admitting: Physical Therapy

## 2024-06-25 ENCOUNTER — Encounter: Payer: Self-pay | Admitting: Physical Therapy

## 2024-06-25 ENCOUNTER — Ambulatory Visit: Admitting: Physical Therapy

## 2024-06-25 VITALS — BP 141/47 | HR 50

## 2024-06-25 DIAGNOSIS — R2689 Other abnormalities of gait and mobility: Secondary | ICD-10-CM

## 2024-06-25 DIAGNOSIS — R2681 Unsteadiness on feet: Secondary | ICD-10-CM

## 2024-06-25 DIAGNOSIS — M6281 Muscle weakness (generalized): Secondary | ICD-10-CM
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
## 2024-06-27 ENCOUNTER — Ambulatory Visit: Admitting: Physical Therapy

## 2024-06-30 ENCOUNTER — Ambulatory Visit: Admitting: Physical Therapy

## 2024-07-04 ENCOUNTER — Ambulatory Visit: Admitting: Physical Therapy

## 2024-07-07 ENCOUNTER — Ambulatory Visit: Admitting: Physical Therapy

## 2024-07-11 ENCOUNTER — Ambulatory Visit: Admitting: Physical Therapy

## 2024-07-14 ENCOUNTER — Ambulatory Visit: Admitting: Physical Therapy

## 2024-07-18 ENCOUNTER — Ambulatory Visit: Admitting: Physical Therapy

## 2024-07-21 ENCOUNTER — Ambulatory Visit: Payer: Self-pay | Admitting: Physical Therapy

## 2024-10-24 ENCOUNTER — Ambulatory Visit (HOSPITAL_COMMUNITY): Admitting: Physician Assistant

## 2024-10-31 ENCOUNTER — Ambulatory Visit (HOSPITAL_COMMUNITY): Admitting: Physician Assistant
